# Patient Record
Sex: Female | Born: 1985
Health system: Southern US, Community
[De-identification: ages and names within clinical notes are randomized; demographics above are authoritative.]

## PROBLEM LIST (undated history)

## (undated) DIAGNOSIS — T8859XA Other complications of anesthesia, initial encounter: Secondary | ICD-10-CM

## (undated) DIAGNOSIS — F329 Major depressive disorder, single episode, unspecified: Secondary | ICD-10-CM

## (undated) DIAGNOSIS — F32A Depression, unspecified: Secondary | ICD-10-CM

## (undated) DIAGNOSIS — F419 Anxiety disorder, unspecified: Secondary | ICD-10-CM

## (undated) DIAGNOSIS — M1990 Unspecified osteoarthritis, unspecified site: Secondary | ICD-10-CM

## (undated) DIAGNOSIS — J45909 Unspecified asthma, uncomplicated: Secondary | ICD-10-CM

## (undated) DIAGNOSIS — M797 Fibromyalgia: Secondary | ICD-10-CM

## (undated) DIAGNOSIS — K760 Fatty (change of) liver, not elsewhere classified: Secondary | ICD-10-CM

## (undated) DIAGNOSIS — T424X1A Poisoning by benzodiazepines, accidental (unintentional), initial encounter: Secondary | ICD-10-CM

## (undated) DIAGNOSIS — K589 Irritable bowel syndrome without diarrhea: Secondary | ICD-10-CM

## (undated) DIAGNOSIS — T4145XA Adverse effect of unspecified anesthetic, initial encounter: Secondary | ICD-10-CM

## (undated) DIAGNOSIS — F410 Panic disorder [episodic paroxysmal anxiety] without agoraphobia: Secondary | ICD-10-CM

## (undated) DIAGNOSIS — E669 Obesity, unspecified: Secondary | ICD-10-CM

## (undated) DIAGNOSIS — I10 Essential (primary) hypertension: Secondary | ICD-10-CM

## (undated) DIAGNOSIS — R7303 Prediabetes: Secondary | ICD-10-CM

## (undated) HISTORY — PX: MOUTH SURGERY: SHX715

## (undated) HISTORY — DX: Poisoning by benzodiazepines, accidental (unintentional), initial encounter: T42.4X1A

## (undated) HISTORY — PX: OTHER SURGICAL HISTORY: SHX169

## (undated) HISTORY — DX: Obesity, unspecified: E66.9

## (undated) HISTORY — DX: Fatty (change of) liver, not elsewhere classified: K76.0

---
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
## 2005-01-10 ENCOUNTER — Other Ambulatory Visit: Admission: RE | Admit: 2005-01-10 | Discharge: 2005-01-10 | Payer: Self-pay | Admitting: Obstetrics and Gynecology

## 2005-03-22 ENCOUNTER — Emergency Department (HOSPITAL_COMMUNITY): Admission: EM | Admit: 2005-03-22 | Discharge: 2005-03-22 | Payer: Self-pay | Admitting: Emergency Medicine

## 2006-01-01 ENCOUNTER — Encounter: Admission: RE | Admit: 2006-01-01 | Discharge: 2006-01-01 | Payer: Self-pay | Admitting: Obstetrics and Gynecology

## 2006-02-05 ENCOUNTER — Inpatient Hospital Stay (HOSPITAL_COMMUNITY): Admission: RE | Admit: 2006-02-05 | Discharge: 2006-02-08 | Payer: Self-pay | Admitting: Obstetrics and Gynecology

## 2006-02-09 ENCOUNTER — Encounter: Admission: RE | Admit: 2006-02-09 | Discharge: 2006-02-23 | Payer: Self-pay | Admitting: Obstetrics and Gynecology

## 2010-09-19 ENCOUNTER — Emergency Department (HOSPITAL_COMMUNITY)
Admission: EM | Admit: 2010-09-19 | Discharge: 2010-09-19 | Disposition: A | Discharge status: Home / Self Care | Payer: PRIVATE HEALTH INSURANCE | Attending: Emergency Medicine | Admitting: Emergency Medicine

## 2010-09-19 DIAGNOSIS — Z0389 Encounter for observation for other suspected diseases and conditions ruled out: Secondary | ICD-10-CM | POA: Insufficient documentation

## 2010-11-18 ENCOUNTER — Ambulatory Visit
Admission: RE | Admit: 2010-11-18 | Discharge: 2010-11-18 | Disposition: A | Discharge status: Home / Self Care | Payer: PRIVATE HEALTH INSURANCE | Source: Ambulatory Visit | Attending: Family Medicine | Admitting: Family Medicine

## 2010-11-18 ENCOUNTER — Other Ambulatory Visit: Payer: Self-pay | Admitting: Family Medicine

## 2011-03-01 ENCOUNTER — Ambulatory Visit: Payer: PRIVATE HEALTH INSURANCE

## 2011-03-01 ENCOUNTER — Ambulatory Visit (INDEPENDENT_AMBULATORY_CARE_PROVIDER_SITE_OTHER): Payer: PRIVATE HEALTH INSURANCE | Admitting: Family Medicine

## 2011-03-01 DIAGNOSIS — F339 Major depressive disorder, recurrent, unspecified: Secondary | ICD-10-CM | POA: Insufficient documentation

## 2011-03-01 DIAGNOSIS — F329 Major depressive disorder, single episode, unspecified: Secondary | ICD-10-CM | POA: Insufficient documentation

## 2011-03-01 DIAGNOSIS — R1110 Vomiting, unspecified: Secondary | ICD-10-CM

## 2011-03-01 DIAGNOSIS — F419 Anxiety disorder, unspecified: Secondary | ICD-10-CM

## 2011-03-01 DIAGNOSIS — F909 Attention-deficit hyperactivity disorder, unspecified type: Secondary | ICD-10-CM

## 2011-03-01 DIAGNOSIS — F32A Depression, unspecified: Secondary | ICD-10-CM

## 2011-03-01 DIAGNOSIS — R079 Chest pain, unspecified: Secondary | ICD-10-CM

## 2011-03-01 DIAGNOSIS — I10 Essential (primary) hypertension: Secondary | ICD-10-CM

## 2011-03-01 HISTORY — DX: Attention-deficit hyperactivity disorder, unspecified type: F90.9

## 2011-03-01 HISTORY — DX: Major depressive disorder, recurrent, unspecified: F33.9

## 2011-03-01 LAB — POCT URINE PREGNANCY: Preg Test, Ur: NEGATIVE

## 2011-03-01 NOTE — Progress Notes (Signed)
Patient Name: Katie [name] Date of Birth: [birth year] Medical Record Number: [id number] Gender: female Date of Encounter: 03/01/2011  History of Present Illness:  Katie [name] is a 26 y.o. very pleasant female patient who presents with the following:  Noted left sided chest discomfort last night, hurt to take a deep breath.  Went upstairs, laid down and went to sleep.  Today has noted the pain on and off.  No pain currenly.  Pain started after she finished dinner last night.  Not associated with exertion.  No nausea with pain.  Pain feels sharp and may last 15 to 20 minutes.  Has "only happened twice today."  Never had this before.    Does have a family history of heart disease in her grandparents.  She has a history of obesity, hypertension.  No tobacco LMP = "I don't know, I don't keep track."   Also has noted sporadic vomiting over the last month.  Will feel nauseated, throw up and then feel ok. Did have heartburn once during this month.  Also had some cough sporadically.  Occasionally coughs something up.  No fevers or chills, does have some nasal congestion.  No diarrhea, appetite ok.    Patient Active Problem List  Diagnoses  . ADHD (attention deficit hyperactivity disorder)  . Anxiety  . Hypertension  . Depression   Past Medical History  Diagnosis Date  . Obesity    Past Surgical History  Procedure Date  . Mouth surgery    History  Substance Use Topics  . Smoking status: Never Smoker   . Smokeless tobacco: Never Used  . Alcohol Use: Yes     once in a while   Family History  Problem Relation Age of Onset  . Heart disease Mother   . Heart disease Maternal Grandfather    Allergies  Allergen Reactions  . Flexeril (Cyclobenzaprine Hcl)    Medication list has been reviewed and updated.  Review of Systems: As per HPI, otherwise negative  Physical Examination: Filed Vitals:   03/01/11 1702  BP: 120/76  Pulse: 72  Temp: 98.4 F (36.9 C)  TempSrc:  Oral  Resp: 16  Height: 5' 6.5" (1.689 m)  Weight: 254 lb 3.2 oz (115.304 kg)   Body mass index is 40.41 kg/(m^2).  GEN: WDWN, NAD, Non-toxic, A & O x 3, obese, acne typical of PCOS.  HEENT: Atraumatic, Normocephalic. Neck supple. No masses, No LAD. Ears and Nose: No external deformity. CV: RRR, No M/G/R. No JVD. No thrill. No extra heart sounds. PULM: CTA B, no wheezes, crackles, rhonchi. No retractions. No resp. distress. No accessory muscle use. ABD: S, NT, ND, +BS. No rebound. No HSM. EXTR: No c/c/e NEURO Normal gait.  PSYCH: Normally interactive. Conversant. Not depressed or anxious appearing.  Calm demeanor.  Chest wall:  Patient does indicate some tenderness to the left of her sternum to pressure.  There is no rash or swelling.   Results for orders placed in visit on 03/01/11  POCT URINE PREGNANCY      Component Value Range   Preg Test, Ur Negative     [facility] reading (PRIMARY) by  Dr. Patsy Lager.  Negative chest xray  CHEST - 2 VIEW  Comparison: None.  Findings: Midline trachea. Normal heart size and mediastinal contours. No pleural effusion or pneumothorax. Clear lungs.  IMPRESSION: Normal chest.  Original Report Authenticated By: Consuello Bossier, M.D.  EKG:  Normal.  12 lead with some artifact that suggests pvc but none on  2 page rhythm strip.  Compared with EKG 2012 and no change.  No ST elevation or depression.   Assessment and Plan: 1. ADHD (attention deficit hyperactivity disorder)    2. Anxiety    3. Hypertension    4. Depression    5. Chest pain  EKG 12-Lead, DG Chest 2 View  6. Vomiting  POCT urine pregnancy   26 year old female with no cardiac history, here due to concern about chest pain that occurred last night at rest and that has occurred twice today.  No pain currently.  Her CXR, VS and EKG are all reassuring.  Explained that I cannot completely rule- out a cardiac issue without an ER evaluation and chest pain rule- out, but the patient declined to  pursue this at this point.  She will let us know if her symptoms continue.  If her CP gets worse, lasts longer or is associated with exercise I urged her to seek care right away.  I believe her CP is likely MSK in origin as it is somewhat reproducible and she has had a cough lately.  Also consider the possibility of GERD- suggested that she try a 2 week trial of an OTC acid reducer.  Ms. Weinfeld does suffer from anxiety and depression which may be contributing to her symptoms.    Vomiting occasionally: see discussion of GERD above.  If an OTC acid reducer does not resolve this problem she is to call or come back in- will need referral to GI for further evaluation. Sooner if worse.   HTN currently controlled with lisinopril. ADHD on adderall

## 2011-04-01 ENCOUNTER — Other Ambulatory Visit: Payer: Self-pay | Admitting: Family Medicine

## 2011-09-13 ENCOUNTER — Other Ambulatory Visit: Payer: Self-pay | Admitting: Physician Assistant

## 2011-11-19 DIAGNOSIS — F909 Attention-deficit hyperactivity disorder, unspecified type: Secondary | ICD-10-CM

## 2011-11-19 DIAGNOSIS — Z8249 Family history of ischemic heart disease and other diseases of the circulatory system: Secondary | ICD-10-CM | POA: Insufficient documentation

## 2011-11-19 DIAGNOSIS — IMO0001 Reserved for inherently not codable concepts without codable children: Secondary | ICD-10-CM | POA: Insufficient documentation

## 2011-11-19 HISTORY — DX: Family history of ischemic heart disease and other diseases of the circulatory system: Z82.49

## 2011-11-19 HISTORY — DX: Reserved for inherently not codable concepts without codable children: IMO0001

## 2011-11-19 HISTORY — DX: Attention-deficit hyperactivity disorder, unspecified type: F90.9

## 2012-03-10 ENCOUNTER — Other Ambulatory Visit: Payer: Self-pay | Admitting: Neurology

## 2012-03-10 DIAGNOSIS — R51 Headache: Secondary | ICD-10-CM

## 2012-03-10 DIAGNOSIS — R259 Unspecified abnormal involuntary movements: Secondary | ICD-10-CM

## 2012-03-10 DIAGNOSIS — Z9189 Other specified personal risk factors, not elsewhere classified: Secondary | ICD-10-CM

## 2012-03-18 ENCOUNTER — Other Ambulatory Visit: Payer: PRIVATE HEALTH INSURANCE

## 2012-03-19 ENCOUNTER — Other Ambulatory Visit: Payer: PRIVATE HEALTH INSURANCE

## 2012-04-05 ENCOUNTER — Ambulatory Visit: Payer: PRIVATE HEALTH INSURANCE | Admitting: Neurology

## 2013-12-21 DIAGNOSIS — K580 Irritable bowel syndrome with diarrhea: Secondary | ICD-10-CM | POA: Insufficient documentation

## 2013-12-21 HISTORY — DX: Irritable bowel syndrome with diarrhea: K58.0

## 2014-01-19 DIAGNOSIS — M436 Torticollis: Secondary | ICD-10-CM

## 2014-01-19 DIAGNOSIS — F411 Generalized anxiety disorder: Secondary | ICD-10-CM | POA: Insufficient documentation

## 2014-01-19 DIAGNOSIS — J9801 Acute bronchospasm: Secondary | ICD-10-CM | POA: Insufficient documentation

## 2014-01-19 HISTORY — DX: Generalized anxiety disorder: F41.1

## 2014-01-19 HISTORY — DX: Acute bronchospasm: J98.01

## 2014-01-19 HISTORY — DX: Torticollis: M43.6

## 2015-03-14 ENCOUNTER — Encounter: Payer: Self-pay | Admitting: Adult Health

## 2015-03-21 ENCOUNTER — Encounter: Payer: Self-pay | Admitting: Adult Health

## 2016-11-06 ENCOUNTER — Ambulatory Visit: Payer: PRIVATE HEALTH INSURANCE | Admitting: Neurology

## 2017-02-22 ENCOUNTER — Encounter: Payer: Self-pay | Admitting: Adult Health

## 2017-05-14 ENCOUNTER — Encounter: Payer: Self-pay | Admitting: Adult Health

## 2017-07-14 ENCOUNTER — Encounter: Payer: Self-pay | Admitting: Adult Health

## 2017-10-26 ENCOUNTER — Ambulatory Visit: Payer: BLUE CROSS/BLUE SHIELD | Admitting: Family Medicine

## 2017-10-26 ENCOUNTER — Encounter: Payer: Self-pay | Admitting: Family Medicine

## 2017-10-26 VITALS — BP 140/86 | HR 94 | Temp 98.2°F | Ht 67.0 in | Wt 290.0 lb

## 2017-10-26 DIAGNOSIS — M436 Torticollis: Secondary | ICD-10-CM

## 2017-10-26 DIAGNOSIS — F339 Major depressive disorder, recurrent, unspecified: Secondary | ICD-10-CM | POA: Diagnosis not present

## 2017-10-26 DIAGNOSIS — F419 Anxiety disorder, unspecified: Secondary | ICD-10-CM | POA: Diagnosis not present

## 2017-10-26 DIAGNOSIS — N921 Excessive and frequent menstruation with irregular cycle: Secondary | ICD-10-CM | POA: Diagnosis not present

## 2017-10-26 MED ORDER — DULOXETINE HCL 30 MG PO CPEP: 90.0000 mg | ORAL_CAPSULE | Freq: Every day | ORAL | 5 refills | Status: DC

## 2017-10-26 MED ORDER — OMEPRAZOLE 20 MG PO CPDR: 20.0000 mg | DELAYED_RELEASE_CAPSULE | Freq: Two times a day (BID) | ORAL | 2 refills | Status: DC

## 2017-10-26 MED ORDER — DIAZEPAM 5 MG PO TABS: 5.0000 mg | ORAL_TABLET | Freq: Four times a day (QID) | ORAL | 4 refills | Status: DC | PRN

## 2017-10-26 MED ORDER — NAPROXEN 500 MG PO TBEC: 500.0000 mg | DELAYED_RELEASE_TABLET | Freq: Two times a day (BID) | ORAL | 2 refills | Status: DC

## 2017-10-26 MED ORDER — BACLOFEN 20 MG PO TABS: 20.0000 mg | ORAL_TABLET | Freq: Three times a day (TID) | ORAL | 2 refills | Status: DC | PRN

## 2017-10-26 MED ORDER — NAPROXEN-ESOMEPRAZOLE 500-20 MG PO TBEC: 1.0000 | DELAYED_RELEASE_TABLET | Freq: Two times a day (BID) | ORAL | 2 refills | Status: DC
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
## 2017-10-26 NOTE — Patient Instructions (Signed)
I will call you when I get and review your records. Call me if you haven't heard from me in 1 months time.   Trial of omeprazole + naproxen instead of vimovo for cost benefit.

## 2017-11-02 ENCOUNTER — Telehealth: Payer: Self-pay | Admitting: Family Medicine

## 2017-11-02 NOTE — Telephone Encounter (Signed)
She returned the record release with notes on the back, but didn't sign it so we could track imaging/records unfortunately. Just had a couple clarification thoughts here:  1. Looks like with previous PCP she declined PT, sports med referral, and ortho referral? Can she tell me more about this? Did she have negative experience? Could we refer her for evaluation now?   2. Only bloodwork I can see was before 2016. If that is case we need to reorder. Let me know.   3. I can't see any recent (last few years) neck imaging which is why we wanted record release. If she has not had any done in last few years let me know.   4. If having the dizziness, vomiting, weakness, anxiety sx then we need to bring her back to re-discuss. I would recommend we get updated bloodwork first however. So let me know.

## 2017-11-03 NOTE — Telephone Encounter (Signed)
Left message for patient to call back. CRM created 

## 2017-11-06 NOTE — Telephone Encounter (Signed)
Left message for patient to call back  

## 2017-11-11 NOTE — Telephone Encounter (Signed)
I have attempted multiple times to reach the patient, unable to reach.

## 2017-11-11 NOTE — Telephone Encounter (Signed)
Please send a letter to have her call us. Thanks

## 2017-11-12 ENCOUNTER — Encounter: Payer: Self-pay | Admitting: Family Medicine

## 2017-11-12 NOTE — Telephone Encounter (Signed)
Letter has been mailed.

## 2017-11-16 ENCOUNTER — Telehealth: Payer: Self-pay | Admitting: Family Medicine

## 2017-11-16 ENCOUNTER — Encounter: Payer: Self-pay | Admitting: Family Medicine

## 2017-11-16 NOTE — Telephone Encounter (Signed)
Left message for patient to call back. CRM created 

## 2017-11-16 NOTE — Telephone Encounter (Signed)
I did get records from [facility]:  *Looks like she is due for a physical (can be scheduled any time)  *vitamin D was quite low; if she is not taking supplement she should be (2000 units daily) and would recommend recheck of levels.   *Was there any relation between her starting cymbalta and the neck pain? Could be a rare reaction so just want to make sure that she doesn't note a connection.  *Did she ever get seen with [facility] specialists for neck? She was referred  **I would recommend referral to [facility] ortho for evaluation and treatment at this point. It will be a good starting place for spinal assessment and initiating treatment of very spastic muscles.   I am happy to see her back to discuss her other written concerns with anxiety documented on record release (of note, we cannot do physical and anxiety assessment in same visit).

## 2017-11-17 NOTE — Telephone Encounter (Signed)
Spoke with patient she is aware that she is due for physical.  Understood that she should start a Vitamin D supplement 2000 a day since she is not currently taking Vitamin D  Stated there was no relation between Cymbalta and neck pain.  Stated that She never heard from [facility] specialist. Patient stated "it's a drive and my mom said there's a long wait." Would like to see a specialist closer to home. I looked into the referral to see why she has not been contacted and I do not see that a referral has been placed for this.  Agreed to referral for ortho.  Patient stated that the notes written on the back of her release form where her anxiety symptoms. Patient stated that "it's not as bad now since starting valium."  Patient is aware that the visit for a physical and anxiety would have to be sperate visits.

## 2017-11-18 ENCOUNTER — Other Ambulatory Visit: Payer: Self-pay | Admitting: Family Medicine

## 2017-11-18 DIAGNOSIS — M436 Torticollis: Secondary | ICD-10-CM

## 2017-11-18 NOTE — Telephone Encounter (Signed)
Noted patient is aware 

## 2017-11-18 NOTE — Telephone Encounter (Signed)
Thanks for that thorough response!  [facility] ortho ref placed for her. She should hear in 2 weeks.

## 2018-01-19 ENCOUNTER — Ambulatory Visit: Payer: Self-pay | Admitting: Adult Health

## 2018-02-16 ENCOUNTER — Ambulatory Visit (INDEPENDENT_AMBULATORY_CARE_PROVIDER_SITE_OTHER): Payer: BLUE CROSS/BLUE SHIELD | Admitting: Internal Medicine

## 2018-02-16 ENCOUNTER — Encounter: Payer: Self-pay | Admitting: Family Medicine

## 2018-02-16 VITALS — BP 100/70 | HR 100 | Temp 98.6°F | Ht 67.0 in | Wt 288.4 lb

## 2018-02-16 DIAGNOSIS — J069 Acute upper respiratory infection, unspecified: Secondary | ICD-10-CM | POA: Diagnosis not present

## 2018-02-16 NOTE — Progress Notes (Signed)
Established Patient Office Visit     CC/Reason for Visit: cough and cold symptoms x1 wk  HPI: Philecia Wagnon is a 33 y.o. female who is coming in today for the above mentioned reasons. Today she c/o a cough and rib pain from coughing x1 week.  She sts she has been coughing up green mucous and has been taking mucinex OTC. She c/o a slight sore throat and a runny nose, with blood tinged when she blow her nose. She denies fever and says shes been eating and drinking with no problems.  She denies ear pain or itching.     Past Medical/Surgical History: Past Medical History:  Diagnosis Date  . Hepatic steatosis   . Obesity     Past Surgical History:  Procedure Laterality Date  . MOUTH SURGERY      Social History:  reports that she has never smoked. She has never used smokeless tobacco. She reports current alcohol use. She reports that she does not use drugs.  Allergies: Allergies  Allergen Reactions  . Lorazepam     Other reaction(s): Other Neck spasm  . Flexeril [Cyclobenzaprine Hcl]     Family History:  Family History  Problem Relation Age of Onset  . Heart disease Mother   . Depression Mother   . High blood pressure Mother   . Kidney disease Mother   . Psychosis Mother   . Hearing loss Father   . Depression Father   . Anxiety disorder Father   . Asthma Daughter   . Learning disabilities Daughter   . Heart disease Maternal Grandfather   . Other Brother        fatty liver  . Multiple sclerosis Maternal Grandmother   . COPD Paternal Grandmother   . Heart failure Paternal Grandmother   . Heart attack Paternal Grandmother   . Stroke Paternal Grandmother   . Lung cancer Paternal Grandfather      Current Outpatient Medications:  .  baclofen (LIORESAL) 20 MG tablet, Take 1 tablet (20 mg total) by mouth 3 (three) times daily as needed for muscle spasms., Disp: 90 each, Rfl: 2 .  diazepam (VALIUM) 5 MG tablet, Take 1 tablet (5 mg total) by mouth  every 6 (six) hours as needed for anxiety. FROM DR Evelene Croon, Disp: 30 tablet, Rfl: 4 .  DULoxetine (CYMBALTA) 30 MG capsule, Take 3 capsules (90 mg total) by mouth daily., Disp: 90 capsule, Rfl: 5 .  Eluxadoline (VIBERZI) 100 MG TABS, Take by mouth 2 (two) times daily., Disp: , Rfl:  .  hydrOXYzine (VISTARIL) 25 MG capsule, Take 25 mg by mouth 4 (four) times daily., Disp: , Rfl:  .  lisinopril-hydrochlorothiazide (PRINZIDE,ZESTORETIC) 20-12.5 MG tablet, , Disp: , Rfl: 1 .  naproxen (EC NAPROSYN) 500 MG EC tablet, Take 1 tablet (500 mg total) by mouth 2 (two) times daily with a meal., Disp: 60 tablet, Rfl: 2 .  Naproxen-Esomeprazole (VIMOVO) 500-20 MG TBEC, Take 1 tablet by mouth 2 (two) times daily., Disp: 60 tablet, Rfl: 2 .  omeprazole (PRILOSEC) 20 MG capsule, Take 1 capsule (20 mg total) by mouth 2 (two) times daily before a meal., Disp: 60 capsule, Rfl: 2 .  phentermine 37.5 MG capsule, Take 37.5 mg by mouth daily., Disp: , Rfl:   Review of Systems: Constitutional: Denies fever, chills, diaphoresis, appetite change and fatigue.  HEENT: Denies photophobia, eye pain, redness, hearing loss, ear pain.  She does c/o of congestion, sore throat, rhinorrhea, sneezing x1 week.  Denies fever, denies diff. Eating and drinking   Respiratory: Denies SOB, DOE,  chest tightness,  and wheezing.  C/o coughing x1 week Cardiovascular: Denies chest pain, palpitations and leg swelling.    Physical Exam: Vitals:   02/16/18 1036  BP: 100/70  Pulse: 100  Temp: 98.6 F (37 C)  TempSrc: Oral  SpO2: 97%  Weight: 288 lb 6 oz (130.8 kg)  Height: 5\' 7"  (1.702 m)    Body mass index is 45.17 kg/m.  Constitutional: NAD, calm Eyes: PERRL, lids and conjunctivae normal ENMT: Mucous membranes are moist. Posterior pharynx clear of any exudate or lesions. Tympanic membrane with slight erythema, no bulging.  pharyx has erythema but no exudate Respiratory: clear to auscultation bilaterally, no wheezing, no crackles.  Normal respiratory effort. No accessory muscle use.  Cardiovascular: Regular rate and rhythm, no murmurs / rubs / gallops. No extremity edema. 2+ pedal pulses.    Impression and Plan:  Viral upper respiratory tract infection -cont to increase water intake -take delsym otc for coughing -take tylenol sinus OTC for pain or pressure -advised to RTC if fever or worsening over next 10-14 days.   Patient Instructions  Great seeing you today & hope you feel better soon.  Instructions: -take Delsym OTC for coughing -increase water intake to flush out sinuses -take tylenol sinus OTC for pain or pressure -follow up in 10 days if symptoms dont improve or if you feel worse     Murlean Iba, RN DNP student [facility]

## 2018-02-16 NOTE — Patient Instructions (Addendum)
Great seeing you today & hope you feel better soon.  Instructions: -take Delsym OTC for coughing -increase water intake to flush out sinuses -take tylenol sinus OTC for pain or pressure -follow up in 10 days if symptoms dont improve or if you feel worse

## 2018-02-19 ENCOUNTER — Telehealth: Payer: Self-pay | Admitting: *Deleted

## 2018-02-19 NOTE — Telephone Encounter (Signed)
Spoke with patient and explained that if she wasn't feeling better she should have called or scheduled a follow up appointment with Dr Ardyth Harps.  Patient states " the doctor denied me medical care and medication.  Now I have missed work and need a doctor's note."  Patient requests to speak with a "supervisor".

## 2018-02-19 NOTE — Telephone Encounter (Signed)
I saw her 3 days ago. If she was sick enough to miss work today, she should have scheduled an appointment to be seen. If her symptoms are worsening she needs to be seen by a provider. Based on our interaction on 2/4 she should have been improving.

## 2018-02-19 NOTE — Telephone Encounter (Signed)
Copied from CRM [id number]. Topic: General - Inquiry >> Feb 19, 2018  2:30 PM Crist InfanteHarrald, Kathy J wrote: Reason for CRM: pt states she could not go to work today do to extreme coughing. Pt needs a work note for being out today. Pt saw Dr Ardyth HarpsHernandez on 02/16/18.  Pt asap

## 2018-02-19 NOTE — Telephone Encounter (Signed)
Pt was here to pick up a work note for being sick and would like to have it today if possible.  She is aware that the provider is in with patients and will address it as soon as she can.  She has an attitude b/c the letter wasn't ready when she came in.

## 2018-02-24 ENCOUNTER — Encounter: Payer: Self-pay | Admitting: *Deleted

## 2018-06-24 ENCOUNTER — Other Ambulatory Visit: Payer: Self-pay | Admitting: Family Medicine

## 2018-08-24 ENCOUNTER — Other Ambulatory Visit: Payer: Self-pay | Admitting: Family Medicine

## 2018-09-26 ENCOUNTER — Other Ambulatory Visit: Payer: Self-pay | Admitting: Family Medicine

## 2018-11-02 ENCOUNTER — Other Ambulatory Visit: Payer: Self-pay | Admitting: Family Medicine

## 2019-01-24 LAB — HM PAP SMEAR

## 2019-02-07 ENCOUNTER — Other Ambulatory Visit: Payer: Self-pay | Admitting: Family Medicine

## 2019-02-09 ENCOUNTER — Other Ambulatory Visit: Payer: Self-pay

## 2019-02-09 ENCOUNTER — Ambulatory Visit (INDEPENDENT_AMBULATORY_CARE_PROVIDER_SITE_OTHER): Payer: BC Managed Care – PPO | Admitting: Family Medicine

## 2019-02-09 ENCOUNTER — Encounter: Payer: Self-pay | Admitting: Family Medicine

## 2019-02-09 VITALS — BP 102/70 | HR 101 | Temp 97.4°F | Ht 67.0 in | Wt 274.9 lb

## 2019-02-09 DIAGNOSIS — E118 Type 2 diabetes mellitus with unspecified complications: Secondary | ICD-10-CM

## 2019-02-09 DIAGNOSIS — M436 Torticollis: Secondary | ICD-10-CM | POA: Diagnosis not present

## 2019-02-09 DIAGNOSIS — M5489 Other dorsalgia: Secondary | ICD-10-CM

## 2019-02-09 MED ORDER — PHENTERMINE HCL 37.5 MG PO CAPS: 37.5000 mg | ORAL_CAPSULE | Freq: Every day | ORAL | 0 refills | Status: DC

## 2019-02-09 MED ORDER — BLOOD GLUCOSE MONITOR KIT: PACK | 0 refills | Status: DC

## 2019-02-09 NOTE — Progress Notes (Signed)
Katie [name] DOB: 1985-04-01 Encounter date: 02/09/2019  This is a 34 y.o. female who presents with Chief Complaint  Patient presents with  . Follow-up    patient requests visit to discuss new diagnosis of diabetes after visit with OB/GYN  . states [facility] diagnosed her with hyperplasia  . Back Pain    worse since last year    History of present illness: Last blood work I have is from March/2017 Last visit with me was in October/2019 for establish care.  At that time, we discussed torticollis of the neck as well as stress and anxiety and depression.  (After record review, we reached out to the patient:  I did get records from [place]:  *Looks like she is due for a physical (can be scheduled any time)  *vitamin D was quite low; if she is not taking supplement she should be (2000 units daily) and would recommend recheck of levels.   *Was there any relation between her starting cymbalta and the neck pain? Could be a rare reaction so just want to make sure that she doesn't note a connection.  *Did she ever get seen with [facility] for neck? She was referred  **I would recommend referral to [facility] for evaluation and treatment at this point. It will be a good starting place for spinal assessment and initiating treatment of very spastic muscles.   I am happy to see her back to discuss her other written concerns with anxiety documented on record release (of note, we cannot do physical and anxiety assessment in same visit).  Response: Spoke with patient she is aware that she is due for physical.  Understood that she should start a Vitamin D supplement 2000 a day since she is not currently taking Vitamin D  Stated there was no relation between Cymbalta and neck pain.  Stated that She never heard from [facility]. Patient stated "it's a drive and my mom said there's a long wait." Would like to see a specialist closer to home. I  looked into the referral to see why she has not been contacted and I do not see that a referral has been placed for this.  Agreed to referral for ortho. (sent to [facility])  Patient stated that the notes written on the back of her release form where her anxiety symptoms. Patient stated that "it's not as bad now since starting valium."  Patient is aware that the visit for a physical and anxiety would have to be sperate visits.)  Unfortunately, patient did not follow-up with [facility].  Referral was placed and they attempted to contact the patient, but were unable to get a hold of her and thus schedule a visit.  Patient states today "I did not return sooner because last time he was here had a bad experience".  She was upset she was not given an antibiotic when she came in for a sick visit.  She repeatedly states "I just been a doctor who cares about me does something to help me"   Follows with Dr. Toy Care q 6 months for anxiety/depression, fibromyalgia.   We also discussed menstrual irregularity. She has since followed with gynecologist. She has been told she has hyperplasia and is worried about this.  Was told that she had diabetes by gyn 2 weeks ago. Had gestational diabetes but just monitored blood sugar/watched intake at that time. Has lost 6lbs since last week. Sticking with protein, vegetables for diet. Got exercise bike for apartment.  States that she didn't have great experience last time when she was here for sick visit in Feb. Had to talk with office manager to get work note.   Neck locks up and pops frequently. ROM is very limited. Has had more problems with lower back. Left shoulder higher than right shoulder. Started sleeping flat on back which she doesn't like but has a hard time getting comfortable - was getting stabbing sensation in back. Just can't get good nights sleep.  Pain in left arm - if she bends arms gets numbness in 4th and 5th digits.  Feels weak in  hands, can't lean to left - just hurts in mid back.   Has been taking the lisinopril-hctz daily. Has been changing diet so thinks this might have helped blood sugar as well.   She would like to continue to work on weight loss.  She has been on weight loss medication in the past and feels that this has been helpful for her.  She has problems with portion control and overeating.  Previously she was eating a lot of unhealthy food, but has been working on this.  Allergies  Allergen Reactions  . Lorazepam     Other reaction(s): Other Neck spasm  . Flexeril [Cyclobenzaprine Hcl]    Current Meds  Medication Sig  . diazepam (VALIUM) 5 MG tablet Take 1 tablet (5 mg total) by mouth every 6 (six) hours as needed for anxiety. FROM DR Toy Care  . Drospirenone (SLYND PO) Take by mouth.  . DULoxetine (CYMBALTA) 30 MG capsule Take 3 capsules (90 mg total) by mouth daily.  Marland Kitchen EC-NAPROXEN 500 MG EC tablet TAKE ONE TABLET BY MOUTH TWICE A DAY AS NEEDED FOR PAIN  . Eluxadoline (VIBERZI) 100 MG TABS Take by mouth 2 (two) times daily.  . hydrOXYzine (VISTARIL) 25 MG capsule Take 25 mg by mouth 4 (four) times daily.  Marland Kitchen lisinopril-hydrochlorothiazide (PRINZIDE,ZESTORETIC) 20-12.5 MG tablet   . omeprazole (PRILOSEC) 20 MG capsule Take 1 capsule (20 mg total) by mouth 2 (two) times daily before a meal.  . phentermine 37.5 MG capsule Take 1 capsule (37.5 mg total) by mouth daily.  . [DISCONTINUED] baclofen (LIORESAL) 20 MG tablet Take 1 tablet (20 mg total) by mouth 3 (three) times daily as needed for muscle spasms.  . [DISCONTINUED] Naproxen-Esomeprazole (VIMOVO) 500-20 MG TBEC Take 1 tablet by mouth 2 (two) times daily.  . [DISCONTINUED] phentermine 37.5 MG capsule Take 37.5 mg by mouth daily.    Review of Systems  Constitutional: Negative for chills, fatigue and fever.  Respiratory: Negative for cough, chest tightness, shortness of breath and wheezing.   Cardiovascular: Negative for chest pain, palpitations and  leg swelling.  Endocrine: Negative for polydipsia, polyphagia and polyuria.  Musculoskeletal: Positive for neck pain and neck stiffness.  Neurological: Positive for weakness and numbness.    Objective:  BP 102/70 (BP Location: Left Arm, Patient Position: Sitting, Cuff Size: Large)   Pulse (!) 101   Temp (!) 97.4 F (36.3 C) (Temporal)   Ht '5\' 7"'  (1.702 m)   Wt 274 lb 14.4 oz (124.7 kg)   SpO2 98%   BMI 43.06 kg/m   Weight: 274 lb 14.4 oz (124.7 kg)   BP Readings from Last 3 Encounters:  02/09/19 102/70  02/16/18 100/70  10/26/17 140/86   Wt Readings from Last 3 Encounters:  02/09/19 274 lb 14.4 oz (124.7 kg)  02/16/18 288 lb 6 oz (130.8 kg)  10/26/17 290 lb (131.5 kg)  Physical Exam Constitutional:      General: She is not in acute distress.    Appearance: She is well-developed.  Cardiovascular:     Rate and Rhythm: Normal rate and regular rhythm.     Heart sounds: Normal heart sounds. No murmur. No friction rub.  Pulmonary:     Effort: Pulmonary effort is normal. No respiratory distress.     Breath sounds: Normal breath sounds. No wheezing or rales.  Musculoskeletal:     Right lower leg: No edema.     Left lower leg: No edema.  Neurological:     Mental Status: She is alert and oriented to person, place, and time.     Deep Tendon Reflexes:     Reflex Scores:      Tricep reflexes are 2+ on the right side and 2+ on the left side.      Bicep reflexes are 2+ on the right side and 2+ on the left side.      Brachioradialis reflexes are 2+ on the right side and 2+ on the left side.    Comments: There is spasm left paracervical musculature, left trap.  Patient is limited with all range of motion of the neck, more significant with left turn or tilt.  No appreciated weakness noted in upper extremities bilaterally.  She has pain that radiates up the left arm tricep reflex testing.  Psychiatric:        Attention and Perception: Attention normal.        Mood and Affect:  Mood is depressed. Affect is blunt.        Speech: Speech normal.        Behavior: Behavior normal.     Assessment/Plan  1. Torticollis, acquired I strongly encouraged her to follow-up on the referral for orthopedics.  I have also given her the number that she can call if she has not heard from them within 2 weeks time.  I discussed with her that her current neck problem is not something that can be easily or quickly fixed with medication and is going to take likely multiple types of intervention to get some improvement.  We reviewed that by going to a multispecialty orthopedic group, she will be able to get multispecialty attention like spine specialty, physical therapy.  I reemphasized that this is something that is beyond my scope of being able to correct. - Ambulatory referral to Orthopedics  2. Midline back pain, unspecified back location, unspecified chronicity - Ambulatory referral to Orthopedics  3. Morbid obesity ([place]) She is interested in weight loss.  I am okay with a trial of phentermine given low blood pressure today.  She has already started to lose weight with dietary changes and plans to start exercise as well.  We will see her back in 1 month in the office to do a weight recheck and discuss further. - phentermine 37.5 MG capsule; Take 1 capsule (37.5 mg total) by mouth daily.  Dispense: 30 capsule; Refill: 0  4. Controlled type 2 diabetes mellitus with complication, without long-term current use of insulin ([place]) Diabetic information was given in the office today.  We will need to continue to discuss in more detail and go through dietary plans, but she has already started to cut out carbohydrates in her diet and focus on adding exercise, which will be helpful.  We discussed at this point, I do not feel that she needs to start medication, but we can rediscuss at next visit and also see what  follow-up A1c looks like in 3 months time.  She is interested in meeting with a dietitian,  as this referral has been put in as well. - blood glucose meter kit and supplies KIT; Dispense based on patient and insurance preference. Use up to four times daily as directed. (FOR ICD-9 250.00, 250.01).  Dispense: 1 each; Refill: 0 - Amb ref to [facility]   Return in about 1 month (around 03/12/2019) for 30 minutes please.    Micheline Rough, MD

## 2019-02-09 NOTE — Patient Instructions (Addendum)
Orthopedic surgeon in [place], [place] Address: [address], [place], [place] [address] Phone: [phone]  Call in 3 weeks if you haven't heard from them.     Check blood sugars occasionally in morning and some 2-3 hours after eating. Bring these recorded sugars with you to next visit.

## 2019-02-18 ENCOUNTER — Other Ambulatory Visit: Payer: Self-pay | Admitting: Obstetrics & Gynecology

## 2019-02-21 ENCOUNTER — Other Ambulatory Visit (HOSPITAL_COMMUNITY): Payer: BC Managed Care – PPO

## 2019-02-22 ENCOUNTER — Other Ambulatory Visit: Payer: Self-pay

## 2019-02-22 ENCOUNTER — Inpatient Hospital Stay (HOSPITAL_COMMUNITY): Admission: RE | Admit: 2019-02-22 | Payer: BC Managed Care – PPO | Source: Ambulatory Visit

## 2019-02-22 ENCOUNTER — Encounter (HOSPITAL_BASED_OUTPATIENT_CLINIC_OR_DEPARTMENT_OTHER): Payer: Self-pay | Admitting: Obstetrics & Gynecology

## 2019-02-22 ENCOUNTER — Encounter (HOSPITAL_COMMUNITY)
Admission: RE | Admit: 2019-02-22 | Discharge: 2019-02-22 | Disposition: A | Discharge status: Home / Self Care | Payer: BC Managed Care – PPO | Source: Ambulatory Visit | Attending: Obstetrics & Gynecology | Admitting: Obstetrics & Gynecology

## 2019-02-22 DIAGNOSIS — R9431 Abnormal electrocardiogram [ECG] [EKG]: Secondary | ICD-10-CM | POA: Insufficient documentation

## 2019-02-22 DIAGNOSIS — Z01818 Encounter for other preprocedural examination: Secondary | ICD-10-CM | POA: Insufficient documentation

## 2019-02-22 LAB — CBC
HCT: 36.2 % (ref 36.0–46.0)
Hemoglobin: 11.8 g/dL — ABNORMAL LOW (ref 12.0–15.0)
MCH: 29.6 pg (ref 26.0–34.0)
MCHC: 32.6 g/dL (ref 30.0–36.0)
MCV: 91 fL (ref 80.0–100.0)
Platelets: 195 10*3/uL (ref 150–400)
RBC: 3.98 MIL/uL (ref 3.87–5.11)
RDW: 12.7 % (ref 11.5–15.5)
WBC: 7.9 10*3/uL (ref 4.0–10.5)
nRBC: 0 % (ref 0.0–0.2)

## 2019-02-22 LAB — BASIC METABOLIC PANEL
Anion gap: 9 (ref 5–15)
BUN: 22 mg/dL — ABNORMAL HIGH (ref 6–20)
CO2: 23 mmol/L (ref 22–32)
Calcium: 9.1 mg/dL (ref 8.9–10.3)
Chloride: 105 mmol/L (ref 98–111)
Creatinine, Ser: 0.92 mg/dL (ref 0.44–1.00)
GFR calc Af Amer: >60 mL/min (ref >60)
GFR calc non Af Amer: >60 mL/min (ref >60)
Glucose, Bld: 95 mg/dL (ref 70–99)
Potassium: 4 mmol/L (ref 3.5–5.1)
Sodium: 137 mmol/L (ref 135–145)

## 2019-02-22 NOTE — Progress Notes (Signed)
Spoke w/ via phone for pre-op interview---Kamariah  Lab needs dos----urine preg             Lab results------lab appt 02-22-2019 for cbc, bmet,, ekg  COVID test ------02-23-2019 Arrive at -------700 am 02-25-2019 NPO after ------midnight Medications to take morning of surgery -----duloxetine, slynd, omeprazole Diabetic medication -----n/a Patient Special Instructions ----- Pre-Op special Istructions ----- Patient verbalized understanding of instructions that were given at this phone interview. Patient denies shortness of breath, chest pain, fever, cough a this phone interview.

## 2019-02-23 ENCOUNTER — Other Ambulatory Visit (HOSPITAL_COMMUNITY)
Admission: RE | Admit: 2019-02-23 | Discharge: 2019-02-23 | Disposition: A | Discharge status: Home / Self Care | Payer: BC Managed Care – PPO | Source: Ambulatory Visit | Attending: Obstetrics & Gynecology | Admitting: Obstetrics & Gynecology

## 2019-02-23 DIAGNOSIS — Z20822 Contact with and (suspected) exposure to covid-19: Secondary | ICD-10-CM | POA: Diagnosis not present

## 2019-02-23 DIAGNOSIS — Z01812 Encounter for preprocedural laboratory examination: Secondary | ICD-10-CM | POA: Diagnosis present

## 2019-02-23 LAB — SARS CORONAVIRUS 2 (TAT 6-24 HRS): SARS Coronavirus 2: NEGATIVE

## 2019-02-24 ENCOUNTER — Encounter (HOSPITAL_BASED_OUTPATIENT_CLINIC_OR_DEPARTMENT_OTHER)
Admission: RE | Disposition: A | Discharge status: Home / Self Care | Payer: Self-pay | Source: Home / Self Care | Attending: Obstetrics & Gynecology

## 2019-02-24 ENCOUNTER — Ambulatory Visit (HOSPITAL_BASED_OUTPATIENT_CLINIC_OR_DEPARTMENT_OTHER)
Admission: RE | Admit: 2019-02-24 | Discharge: 2019-02-24 | Disposition: A | Discharge status: Home / Self Care | Payer: BC Managed Care – PPO | Attending: Obstetrics & Gynecology | Admitting: Obstetrics & Gynecology

## 2019-02-24 ENCOUNTER — Ambulatory Visit (HOSPITAL_BASED_OUTPATIENT_CLINIC_OR_DEPARTMENT_OTHER): Payer: BC Managed Care – PPO | Admitting: Anesthesiology

## 2019-02-24 ENCOUNTER — Encounter (HOSPITAL_BASED_OUTPATIENT_CLINIC_OR_DEPARTMENT_OTHER): Payer: Self-pay | Admitting: Obstetrics & Gynecology

## 2019-02-24 DIAGNOSIS — K219 Gastro-esophageal reflux disease without esophagitis: Secondary | ICD-10-CM | POA: Insufficient documentation

## 2019-02-24 DIAGNOSIS — Z6841 Body Mass Index (BMI) 40.0 and over, adult: Secondary | ICD-10-CM | POA: Insufficient documentation

## 2019-02-24 DIAGNOSIS — E282 Polycystic ovarian syndrome: Secondary | ICD-10-CM | POA: Insufficient documentation

## 2019-02-24 DIAGNOSIS — Z79899 Other long term (current) drug therapy: Secondary | ICD-10-CM | POA: Insufficient documentation

## 2019-02-24 DIAGNOSIS — N8502 Endometrial intraepithelial neoplasia [EIN]: Secondary | ICD-10-CM | POA: Insufficient documentation

## 2019-02-24 DIAGNOSIS — F419 Anxiety disorder, unspecified: Secondary | ICD-10-CM | POA: Diagnosis not present

## 2019-02-24 DIAGNOSIS — F329 Major depressive disorder, single episode, unspecified: Secondary | ICD-10-CM | POA: Insufficient documentation

## 2019-02-24 DIAGNOSIS — I10 Essential (primary) hypertension: Secondary | ICD-10-CM | POA: Diagnosis not present

## 2019-02-24 HISTORY — DX: Other complications of anesthesia, initial encounter: T88.59XA

## 2019-02-24 HISTORY — DX: Endometrial intraepithelial neoplasia (EIN): N85.02

## 2019-02-24 HISTORY — DX: Essential (primary) hypertension: I10

## 2019-02-24 HISTORY — DX: Prediabetes: R73.03

## 2019-02-24 HISTORY — PX: DILATATION & CURETTAGE/HYSTEROSCOPY WITH MYOSURE: SHX6511

## 2019-02-24 LAB — POCT PREGNANCY, URINE: Preg Test, Ur: NEGATIVE

## 2019-02-24 SURGERY — DILATATION & CURETTAGE/HYSTEROSCOPY WITH MYOSURE
Anesthesia: General | Site: Vagina

## 2019-02-24 MED ORDER — MEGESTROL ACETATE 40 MG PO TABS: 40.0000 mg | ORAL_TABLET | Freq: Two times a day (BID) | ORAL | 3 refills | Status: DC

## 2019-02-24 MED ORDER — MIDAZOLAM HCL 2 MG/2ML IJ SOLN
INTRAMUSCULAR | Status: AC
  Filled 2019-02-24: qty 2

## 2019-02-24 MED ORDER — DEXAMETHASONE SODIUM PHOSPHATE 10 MG/ML IJ SOLN
INTRAMUSCULAR | Status: AC
  Filled 2019-02-24: qty 1

## 2019-02-24 MED ORDER — OXYCODONE HCL 5 MG PO TABS
ORAL_TABLET | ORAL | Status: AC
  Filled 2019-02-24: qty 1

## 2019-02-24 MED ORDER — ACETAMINOPHEN 500 MG PO TABS
ORAL_TABLET | ORAL | Status: AC
  Filled 2019-02-24: qty 2

## 2019-02-24 MED ORDER — FENTANYL CITRATE (PF) 100 MCG/2ML IJ SOLN
INTRAMUSCULAR | Status: DC | PRN
  Administered 2019-02-24 (×2): 50 ug via INTRAVENOUS

## 2019-02-24 MED ORDER — FENTANYL CITRATE (PF) 100 MCG/2ML IJ SOLN
INTRAMUSCULAR | Status: AC
  Filled 2019-02-24: qty 4

## 2019-02-24 MED ORDER — LIDOCAINE 2% (20 MG/ML) 5 ML SYRINGE
INTRAMUSCULAR | Status: AC
  Filled 2019-02-24: qty 5

## 2019-02-24 MED ORDER — PROPOFOL 10 MG/ML IV BOLUS
INTRAVENOUS | Status: DC | PRN
  Administered 2019-02-24: 200 mg via INTRAVENOUS

## 2019-02-24 MED ORDER — SCOPOLAMINE 1 MG/3DAYS TD PT72
MEDICATED_PATCH | TRANSDERMAL | Status: AC
  Filled 2019-02-24: qty 1

## 2019-02-24 MED ORDER — SODIUM CHLORIDE 0.9 % IR SOLN
Status: DC | PRN
  Administered 2019-02-24: 3000 mL

## 2019-02-24 MED ORDER — ONDANSETRON HCL 4 MG/2ML IJ SOLN
INTRAMUSCULAR | Status: AC
  Filled 2019-02-24: qty 2

## 2019-02-24 MED ORDER — FENTANYL CITRATE (PF) 100 MCG/2ML IJ SOLN
25.0000 ug | INTRAMUSCULAR | Status: DC | PRN
  Administered 2019-02-24 (×4): 25 ug via INTRAVENOUS
  Filled 2019-02-24: qty 1

## 2019-02-24 MED ORDER — MIDAZOLAM HCL 2 MG/2ML IJ SOLN
INTRAMUSCULAR | Status: DC | PRN
  Administered 2019-02-24: 2 mg via INTRAVENOUS

## 2019-02-24 MED ORDER — KETOROLAC TROMETHAMINE 30 MG/ML IJ SOLN
INTRAMUSCULAR | Status: DC | PRN
  Administered 2019-02-24: 30 mg via INTRAVENOUS

## 2019-02-24 MED ORDER — SCOPOLAMINE 1 MG/3DAYS TD PT72
MEDICATED_PATCH | TRANSDERMAL | Status: DC | PRN
  Administered 2019-02-24: 1 via TRANSDERMAL

## 2019-02-24 MED ORDER — LIDOCAINE 2% (20 MG/ML) 5 ML SYRINGE
INTRAMUSCULAR | Status: DC | PRN
  Administered 2019-02-24: 80 mg via INTRAVENOUS

## 2019-02-24 MED ORDER — FENTANYL CITRATE (PF) 100 MCG/2ML IJ SOLN
INTRAMUSCULAR | Status: AC
  Filled 2019-02-24: qty 2

## 2019-02-24 MED ORDER — ONDANSETRON HCL 4 MG/2ML IJ SOLN
INTRAMUSCULAR | Status: DC | PRN
  Administered 2019-02-24: 4 mg via INTRAVENOUS

## 2019-02-24 MED ORDER — CEFAZOLIN SODIUM-DEXTROSE 2-4 GM/100ML-% IV SOLN
INTRAVENOUS | Status: AC
  Filled 2019-02-24: qty 100

## 2019-02-24 MED ORDER — ACETAMINOPHEN 500 MG PO TABS
1000.0000 mg | ORAL_TABLET | Freq: Once | ORAL | Status: AC
  Administered 2019-02-24: 1000 mg via ORAL
  Filled 2019-02-24: qty 2

## 2019-02-24 MED ORDER — LACTATED RINGERS IV SOLN
INTRAVENOUS | Status: DC
  Filled 2019-02-24: qty 1000

## 2019-02-24 MED ORDER — OXYCODONE HCL 5 MG PO TABS
5.0000 mg | ORAL_TABLET | Freq: Once | ORAL | Status: AC
  Administered 2019-02-24: 11:00:00 5 mg via ORAL
  Filled 2019-02-24: qty 1

## 2019-02-24 MED ORDER — KETOROLAC TROMETHAMINE 30 MG/ML IJ SOLN
INTRAMUSCULAR | Status: AC
  Filled 2019-02-24: qty 1

## 2019-02-24 MED ORDER — PROPOFOL 10 MG/ML IV BOLUS
INTRAVENOUS | Status: AC
  Filled 2019-02-24: qty 40

## 2019-02-24 MED ORDER — DEXAMETHASONE SODIUM PHOSPHATE 10 MG/ML IJ SOLN
INTRAMUSCULAR | Status: DC | PRN
  Administered 2019-02-24: 5 mg via INTRAVENOUS

## 2019-02-24 MED ORDER — LIDOCAINE-EPINEPHRINE 1 %-1:100000 IJ SOLN
INTRAMUSCULAR | Status: DC | PRN
  Administered 2019-02-24: 20 mL

## 2019-02-24 MED ORDER — CEFAZOLIN SODIUM-DEXTROSE 2-4 GM/100ML-% IV SOLN
2.0000 g | INTRAVENOUS | Status: AC
  Administered 2019-02-24: 2 g via INTRAVENOUS
  Filled 2019-02-24: qty 100

## 2019-02-24 SURGICAL SUPPLY — 19 items
CANISTER SUCT 3000ML PPV (MISCELLANEOUS) ×2 IMPLANT
CATH ROBINSON RED A/P 16FR (CATHETERS) ×2 IMPLANT
COVER WAND RF STERILE (DRAPES) ×2 IMPLANT
DEVICE MYOSURE LITE (MISCELLANEOUS) IMPLANT
DEVICE MYOSURE REACH (MISCELLANEOUS) ×1 IMPLANT
GAUZE 4X4 16PLY RFD (DISPOSABLE) ×2 IMPLANT
GLOVE BIO SURGEON STRL SZ 6.5 (GLOVE) ×1 IMPLANT
GLOVE BIO SURGEON STRL SZ7 (GLOVE) ×2 IMPLANT
GLOVE BIOGEL PI IND STRL 7.0 (GLOVE) ×2 IMPLANT
GLOVE BIOGEL PI IND STRL 7.5 (GLOVE) IMPLANT
GLOVE BIOGEL PI INDICATOR 7.0 (GLOVE) ×3
GLOVE BIOGEL PI INDICATOR 7.5 (GLOVE) ×1
GOWN STRL REUS W/TWL LRG LVL3 (GOWN DISPOSABLE) ×4 IMPLANT
KIT PROCEDURE FLUENT (KITS) ×2 IMPLANT
PACK VAGINAL MINOR WOMEN LF (CUSTOM PROCEDURE TRAY) ×2 IMPLANT
PAD OB MATERNITY 4.3X12.25 (PERSONAL CARE ITEMS) ×2 IMPLANT
SEAL CERVICAL OMNI LOK (ABLATOR) IMPLANT
SEAL ROD LENS SCOPE MYOSURE (ABLATOR) ×2 IMPLANT
TOWEL OR 17X26 10 PK STRL BLUE (TOWEL DISPOSABLE) ×2 IMPLANT

## 2019-02-24 NOTE — Discharge Instructions (Signed)
Next dose Aleve,Motrin,Advil after 3 pm as needed Next dose tylenol after 2 pm as needed  Post Anesthesia Home Care Instructions  Activity: Get plenty of rest for the remainder of the day. A responsible individual must stay with you for 24 hours following the procedure.  For the next 24 hours, DO NOT: -Drive a car -Advertising copywriter -Drink alcoholic beverages -Take any medication unless instructed by your physician -Make any legal decisions or sign important papers.  Meals: Start with liquid foods such as gelatin or soup. Progress to regular foods as tolerated. Avoid greasy, spicy, heavy foods. If nausea and/or vomiting occur, drink only clear liquids until the nausea and/or vomiting subsides. Call your physician if vomiting continues.  Special Instructions/Symptoms: Your throat may feel dry or sore from the anesthesia or the breathing tube placed in your throat during surgery. If this causes discomfort, gargle with warm salt water. The discomfort should disappear within 24 hours.  If you had a scopolamine patch placed behind your ear for the management of post- operative nausea and/or vomiting:  1. The medication in the patch is effective for 72 hours, after which it should be removed.  Wrap patch in a tissue and discard in the trash. Wash hands thoroughly with soap and water. 2. You may remove the patch earlier than 72 hours if you experience unpleasant side effects which may include dry mouth, dizziness or visual disturbances. 3. Avoid touching the patch. Wash your hands with soap and water after contact with the patch.

## 2019-02-24 NOTE — Anesthesia Procedure Notes (Signed)
Procedure Name: LMA Insertion Date/Time: 02/24/2019 9:09 AM Performed by: Tyrone Nine, CRNA Pre-anesthesia Checklist: Timeout performed, Patient identified, Emergency Drugs available, Suction available and Patient being monitored Patient Re-evaluated:Patient Re-evaluated prior to induction Oxygen Delivery Method: Circle system utilized Preoxygenation: Pre-oxygenation with 100% oxygen Induction Type: IV induction Ventilation: Mask ventilation without difficulty LMA: LMA inserted LMA Size: 4.0 Number of attempts: 1 Placement Confirmation: breath sounds checked- equal and bilateral,  CO2 detector and positive ETCO2 Tube secured with: Tape Dental Injury: Teeth and Oropharynx as per pre-operative assessment

## 2019-02-24 NOTE — Anesthesia Preprocedure Evaluation (Addendum)
Anesthesia Evaluation  Patient identified by MRN, date of birth, ID band Patient awake    Reviewed: Allergy & Precautions, NPO status , Patient's Chart, lab work & pertinent test results  Airway Mallampati: III  TM Distance: <3 FB Neck ROM: Full  Mouth opening: Limited Mouth Opening  Dental no notable dental hx. (+) Teeth Intact, Dental Advisory Given   Pulmonary neg pulmonary ROS,    Pulmonary exam normal breath sounds clear to auscultation       Cardiovascular hypertension, Pt. on medications Normal cardiovascular exam Rhythm:Regular Rate:Normal     Neuro/Psych PSYCHIATRIC DISORDERS Anxiety Depression negative neurological ROS     GI/Hepatic Neg liver ROS, GERD  Medicated,  Endo/Other  diabetes, Well ControlledMorbid obesity (BMI 43)  Renal/GU negative Renal ROS  negative genitourinary   Musculoskeletal negative musculoskeletal ROS (+)   Abdominal   Peds  Hematology negative hematology ROS (+)   Anesthesia Other Findings   Reproductive/Obstetrics                           Anesthesia Physical Anesthesia Plan  ASA: III  Anesthesia Plan: General   Post-op Pain Management:    Induction: Intravenous  PONV Risk Score and Plan: Ondansetron, Dexamethasone and Midazolam  Airway Management Planned: LMA  Additional Equipment:   Intra-op Plan:   Post-operative Plan: Extubation in OR  Informed Consent: I have reviewed the patients History and Physical, chart, labs and discussed the procedure including the risks, benefits and alternatives for the proposed anesthesia with the patient or authorized representative who has indicated his/her understanding and acceptance.     Dental advisory given  Plan Discussed with: CRNA  Anesthesia Plan Comments:         Anesthesia Quick Evaluation

## 2019-02-24 NOTE — Transfer of Care (Signed)
Immediate Anesthesia Transfer of Care Note  Patient: Katie [name]  Procedure(s) Performed: DILATATION & CURETTAGE/HYSTEROSCOPY WITH MYOSURE (N/A Vagina )  Patient Location: PACU  Anesthesia Type:General  Level of Consciousness: awake, alert , oriented and patient cooperative  Airway & Oxygen Therapy: Patient Spontanous Breathing and Patient connected to nasal cannula oxygen  Post-op Assessment: Report given to RN and Post -op Vital signs reviewed and stable  Post vital signs: Reviewed and stable  Last Vitals:  Vitals Value Taken Time  BP    Temp    Pulse 109 02/24/19 0941  Resp 16 02/24/19 0941  SpO2 100 % 02/24/19 0941  Vitals shown include unvalidated device data.  Last Pain:  Vitals:   02/24/19 0726  TempSrc: Oral  PainSc: 0-No pain      Patients Stated Pain Goal: 4 (02/24/19 0726)  Complications: No apparent anesthesia complications

## 2019-02-24 NOTE — Op Note (Signed)
Preoperative diagnosis: Complex atypical endometrial hyperplasia. Chronic anovulation.   Postop diagnosis: as above.  Procedure: Hysteroscopy guided visual D&C with Myosure Reach Anesthesia General via LMA and paracervical block with 20cc 1% Lidocaine with epi. Surgeon: Shea Evans, MD  Assistant:None IV fluids 800 cc LR Estimated blood loss  5 cc Urine output: straight catheter preop  50 cc Saline irrigation fluid deficit: 300 cc  Complications none  Condition stable  Disposition PACU  Specimen: Endometrial curettings   Procedure  Indication: 33 yo with long standing oligomenorrhea/menorrhagia, thick endometrium stripe on pelvic sono and office endometrial biopsy noted Complex atypical endometrial hyperplasia. Patient declines hysterectomy as she desires future child-bearing, so declined GynOnc referral. She is brought here for visual D&C with Myosure hysteroscopy for thorough endometrial sampling to rule out co-exsiting endometrial cancer. Patient was counseled on risks/ complications including infection, bleeding, damage to internal organs, she understood and agrees, gave informed written consent for the procedure.  Patient was brought to the operating room with IV running. Time out was carried out.  She underwent general anesthesia via LMA without complications. She was given dorsolithotomy position. Parts were prepped and draped in standard fashion. Bladder emptied with red rubber cath.. Bimanual exam revealed uterus to be anteverted and normal size. Speculum was placed and cervix was grasped with single-tooth tenaculum. Cervical block with 20 cc 1% Xylocaine with epinephrine given.  Cervical os was noted to be dilated likely from ongoing bleeding. Diagnostic Hysteroscope was introduced in the uterine cavity under vision, using saline irrigation. Thick irregular endometrial line noted. So decision was made to proceed with Myosure D&C using Reach.  Visual D&C done of the entire endometrium  until both tubal ostii were seen well. Specimen sent to path. Hysteroscope was removed. Endometrial curettage was performed with sharp curette with no return of tissue at this point.   Fluid deficit 300 cc.  All counts are correct x2. No complications. Patient was made supine dorsal anesthesia and brought to the recovery room in stable condition.  Patient will be discharged home. Follow up in 2 weeks in office. Warning signs of infection and excessive bleeding reviewed.   V.Mckay Brandt, MD.

## 2019-02-24 NOTE — Anesthesia Postprocedure Evaluation (Signed)
Anesthesia Post Note  Patient: Suszanne Conners  Procedure(s) Performed: DILATATION & CURETTAGE/HYSTEROSCOPY WITH MYOSURE (N/A Vagina )     Patient location during evaluation: PACU Anesthesia Type: General Level of consciousness: awake and alert Pain management: pain level controlled Vital Signs Assessment: post-procedure vital signs reviewed and stable Respiratory status: spontaneous breathing, nonlabored ventilation, respiratory function stable and patient connected to nasal cannula oxygen Cardiovascular status: blood pressure returned to baseline and stable Postop Assessment: no apparent nausea or vomiting Anesthetic complications: no    Last Vitals:  Vitals:   02/24/19 1030 02/24/19 1200  BP: 127/63 125/72  Pulse: 88 88  Resp: 12 14  Temp:  36.8 C  SpO2: 98% 99%    Last Pain:  Vitals:   02/24/19 1200  TempSrc:   PainSc: 3                  Dragon Thrush L Zorana Brockwell
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
## 2019-02-25 LAB — SURGICAL PATHOLOGY

## 2019-03-09 ENCOUNTER — Encounter: Payer: Self-pay | Admitting: Registered"

## 2019-03-09 ENCOUNTER — Encounter: Payer: BC Managed Care – PPO | Attending: Family Medicine | Admitting: Registered"

## 2019-03-09 ENCOUNTER — Other Ambulatory Visit: Payer: Self-pay

## 2019-03-09 DIAGNOSIS — E119 Type 2 diabetes mellitus without complications: Secondary | ICD-10-CM | POA: Insufficient documentation

## 2019-03-09 HISTORY — DX: Type 2 diabetes mellitus without complications: E11.9

## 2019-03-09 NOTE — Patient Instructions (Addendum)
Aim to eat balanced meals and snacks. The casserole that you described should be fine if you divide into 4 servings. You can you checking your blood sugar to help you see how you are doing. Fasting and 2 hours after eating give you the most useful information. Checking 1-2 x per week is often enough. Consider keeping a blood sugar log and take to your doctor's appointments. Try some of the suggestions to add more variety to your salads. Great progress on changes you have already made such as increased exercise and cutting out sugar sweetened beverages.

## 2019-03-09 NOTE — Progress Notes (Signed)
Diabetes Self-Management Education  Visit Type: First/Initial  Appt. Start Time: 1540 Appt. End Time: 1610  03/09/2019  Ms. Katie [name], identified by name and date of birth, is a 34 y.o. female with a diagnosis of Diabetes: Type 2.   ASSESSMENT  Last menstrual period 02/17/2019. There is no height or weight on file to calculate BMI.   Patient states making several changes since getting diagnosis such as purchasing a stationary bike and using 60 min daily and cutting out sweetened beverages. 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  Patient states other dietary changes include salads for lunch and cutting out pasta and candy. Pt reports 12 lb weight loss since last MD visit.  Patient reports recent surgery and is having some stress related to concern over her condition and not understanding medications she is on.  RD discussed metformin and potential benefit if she sees that her A1c starts to increase.   Diabetes Self-Management Education - 03/09/19 1548      Visit Information   Visit Type  First/Initial      Initial Visit   Diabetes Type  Type 2    Are you currently following a meal plan?  Yes    What type of meal plan do you follow?  low carb    Are you taking your medications as prescribed?  Not on Medications    Date Diagnosed  Jan 2021      Health Coping   How would you rate your overall health?  Good      Psychosocial Assessment   Patient Belief/Attitude about Diabetes  Motivated to manage diabetes    How often do you need to have someone help you when you read instructions, pamphlets, or other written materials from your doctor or pharmacy?  1 - Never    What is the last grade level you completed in school?  some college      Complications   Last HgB A1C per patient/outside source  6.5 %    How often do you check your blood sugar?  0 times/day (not testing)    Have you had a dilated eye exam in the past 12 months?  No    Have you  had a dental exam in the past 12 months?  No    Are you checking your feet?  No      Dietary Intake   Breakfast  jimmy dean bites 12 g carb    Snack (morning)  string cheese    Lunch  salad    Dinner  meat, vegetables.    Beverage(s)  water, with a little flavor      Exercise   Exercise Type  Light (walking / raking leaves)    How many days per week to you exercise?  7    How many minutes per day do you exercise?  60    Total minutes per week of exercise  420      Patient Education   Previous Diabetes Education  Yes (please comment)   GDM 2007   Disease state   Definition of diabetes, type 1 and 2, and the diagnosis of diabetes    Nutrition management   Role of diet in the treatment of diabetes and the relationship between the three main macronutrients and blood glucose level;Food label reading, portion sizes and measuring food.    Physical activity and exercise   Role of exercise on diabetes management, blood pressure control and  cardiac health.    Medications  Reviewed patients medication for diabetes, action, purpose, timing of dose and side effects.    Monitoring  Identified appropriate SMBG and/or A1C goals.      Individualized Goals (developed by patient)   Nutrition  General guidelines for healthy choices and portions discussed    Physical Activity  Exercise 5-7 days per week    Monitoring   test my blood glucose as discussed      Outcomes   Expected Outcomes  Demonstrated interest in learning. Expect positive outcomes    Future DMSE  4-6 wks    Program Status  Not Completed       Individualized Plan for Diabetes Self-Management Training:   Learning Objective:  Patient will have a greater understanding of diabetes self-management. Patient education plan is to attend individual and/or group sessions per assessed needs and concerns.   Patient Instructions  Aim to eat balanced meals and snacks. The casserole that you described should be fine if you divide into 4  servings. You can you checking your blood sugar to help you see how you are doing. Fasting and 2 hours after eating give you the most useful information. Checking 1-2 x per week is often enough. Consider keeping a blood sugar log and take to your doctor's appointments. Try some of the suggestions to add more variety to your salads. Great progress on changes you have already made such as increased exercise and cutting out sugar sweetened beverages.   Expected Outcomes:  Demonstrated interest in learning. Expect positive outcomes  Education material provided: ADA - How to Thrive: A Guide for Your Journey with Diabetes, A1C conversion sheet and Carbohydrate counting sheet, Mix it Up Salad ideas  If problems or questions, patient to contact team via:  Phone and Mychart  Future DSME appointment: 4-6 wks

## 2019-03-24 ENCOUNTER — Other Ambulatory Visit: Payer: Self-pay

## 2019-03-25 ENCOUNTER — Encounter: Payer: Self-pay | Admitting: Family Medicine

## 2019-03-25 ENCOUNTER — Other Ambulatory Visit: Payer: Self-pay | Admitting: Family Medicine

## 2019-03-25 ENCOUNTER — Ambulatory Visit (INDEPENDENT_AMBULATORY_CARE_PROVIDER_SITE_OTHER): Payer: BC Managed Care – PPO | Admitting: Family Medicine

## 2019-03-25 VITALS — BP 118/80 | HR 94 | Temp 97.7°F | Ht 66.0 in | Wt 252.6 lb

## 2019-03-25 DIAGNOSIS — N8502 Endometrial intraepithelial neoplasia [EIN]: Secondary | ICD-10-CM

## 2019-03-25 DIAGNOSIS — I10 Essential (primary) hypertension: Secondary | ICD-10-CM | POA: Diagnosis not present

## 2019-03-25 DIAGNOSIS — M436 Torticollis: Secondary | ICD-10-CM

## 2019-03-25 DIAGNOSIS — E119 Type 2 diabetes mellitus without complications: Secondary | ICD-10-CM | POA: Diagnosis not present

## 2019-03-25 NOTE — Patient Instructions (Signed)
I will be back in touch with you once I review Dr. Camillia Herter office notes/recommendations.

## 2019-03-25 NOTE — Progress Notes (Signed)
Katie [name] DOB: 12-01-1985 Encounter date: 03/25/2019  This is a 34 y.o. female who presents with Chief Complaint  Patient presents with  . Follow-up    History of present illness: Wasn't able to get MRI last night due to spasm. Neck couldn't stop moving. She has to lay on side to prop up head in order to lay flat. Took 2 valium and a baclofen before MRI and still couldn't lay still.   "I'm eating like a bird". Trying to keep eating simple - eating jimmy dean frozen breakfast; light snacks, light frozen dinner and then light dinner. Megace is new from Dr. Benjie Karvonen - increases her appetite.   Torticollis neck: Patient has followed up with Ortho.  See above.  She is very frustrated by not being able to complete MRI. She feels that because she could not get this completed that she should just give up entirely and that no one is going to be able to help her.  In the past she was told that this condition was "all in her head" and that she needed to see psychiatry.  HTN: not checking at home. Still taking lisinopril-hctz.   DMII: did check sugar yesterday and today: yesterday was 100 in morning; after lunch was 86. Today just checked 103. Really low carb diet.   Anxiety/Depression: just frustrated with everything going on. Also incident at work with colleague that made her uncomfortable, but she doesn't want to report/discuss with colleague or management.    Allergies  Allergen Reactions  . Lorazepam     Other reaction(s): Other Neck spasm  . Flexeril [Cyclobenzaprine Hcl]     Difficulty breathing   Current Meds  Medication Sig  . baclofen (LIORESAL) 10 MG tablet Take 10 mg by mouth 4 (four) times daily.  . blood glucose meter kit and supplies KIT Dispense based on patient and insurance preference. Use up to four times daily as directed. (FOR ICD-9 250.00, 250.01).  . diazepam (VALIUM) 5 MG tablet Take 1 tablet (5 mg total) by mouth every 6 (six) hours as needed for anxiety.  FROM DR [facility]  . Drospirenone (SLYND PO) Take by mouth.  . DULoxetine (CYMBALTA) 30 MG capsule Take 3 capsules (90 mg total) by mouth daily.  Marland Kitchen EC-NAPROXEN 500 MG EC tablet TAKE ONE TABLET BY MOUTH TWICE A DAY AS NEEDED FOR PAIN  . Eluxadoline (VIBERZI) 100 MG TABS Take by mouth 2 (two) times daily. No refills  . hydrOXYzine (VISTARIL) 25 MG capsule Take 25 mg by mouth as needed.   Marland Kitchen lisinopril-hydrochlorothiazide (PRINZIDE,ZESTORETIC) 20-12.5 MG tablet   . megestrol (MEGACE) 40 MG tablet Take 1 tablet (40 mg total) by mouth 2 (two) times daily.  Marland Kitchen omeprazole (PRILOSEC) 20 MG capsule Take 1 capsule (20 mg total) by mouth 2 (two) times daily before a meal.  . phentermine 37.5 MG capsule Take 1 capsule (37.5 mg total) by mouth daily.  Marland Kitchen UNABLE TO FIND Med Name:megace daily not sure does [facility] does not have    Review of Systems  Constitutional: Negative for chills, fatigue and fever.  Respiratory: Negative for cough, chest tightness, shortness of breath and wheezing.   Cardiovascular: Negative for chest pain, palpitations and leg swelling.  Musculoskeletal:       No change in torticollis.  Still significant muscle spasm in the neck and limited range of motion.  Psychiatric/Behavioral: Positive for agitation.    Objective:  BP 118/80 (BP Location: Left Arm, Patient Position: Sitting, Cuff Size: Large)  Pulse 94   Temp 97.7 F (36.5 C) (Temporal)   Ht '5\' 6"'  (1.676 m)   Wt 252 lb 9.6 oz (114.6 kg)   SpO2 96%   BMI 40.77 kg/m   Weight: 252 lb 9.6 oz (114.6 kg)   BP Readings from Last 3 Encounters:  03/25/19 118/80  02/24/19 125/72  02/22/19 (!) 146/68   Wt Readings from Last 3 Encounters:  03/25/19 252 lb 9.6 oz (114.6 kg)  02/24/19 267 lb 1.6 oz (121.2 kg)  02/22/19 268 lb 9.6 oz (121.8 kg)    Physical Exam Constitutional:      General: She is not in acute distress.    Appearance: She is well-developed.  Cardiovascular:     Rate and Rhythm: Normal rate and regular  rhythm.     Heart sounds: Normal heart sounds. No murmur. No friction rub.  Pulmonary:     Effort: Pulmonary effort is normal. No respiratory distress.     Breath sounds: Normal breath sounds. No wheezing or rales.  Musculoskeletal:     Right lower leg: No edema.     Left lower leg: No edema.  Neurological:     Mental Status: She is alert and oriented to person, place, and time.  Psychiatric:        Attention and Perception: Attention normal.        Mood and Affect: Affect is angry.        Behavior: Behavior normal.        Cognition and Memory: Cognition normal.     Assessment/Plan  1. Essential hypertension Blood pressures well controlled today.  Continue current medication.  2. Newly diagnosed diabetes ([place]) She has worked very hard on decreased caloric intake.  Actually concerned she is not taking in enough calories at this point, she was given her diabetic handout and MA went through carb counting and carb servings with her today.  3. Complex atypical endometrial hyperplasia She is following with gynecology.  I requested recent office notes.  She is worried about and does not understand what follow-up is needed.  I told her to review office notes, pathology and try to get back with her to review what their thoughts/plans are.  4. Torticollis, acquired We reached out to Dr. Jeralyn Ruths office today to let them know about her being unable to tolerate the MRI.  I tried to encourage her to be patient with this process. They will work with her to try and allow muscles to relax so that they can complete evaluation and work on treatment plan.    Return in about 3 months (around 06/25/2019). Much of visit spent with reassuring today. She has not had good experiences with physicians in the past and is very distrustful in general when it comes to health care. I will try to follow along closely with her conditions and specialty care so that we can make sure she feels that she is understanding  all communication.    Micheline Rough, MD

## 2019-03-26 ENCOUNTER — Encounter: Payer: Self-pay | Admitting: Family Medicine

## 2019-03-28 MED ORDER — PHENTERMINE HCL 37.5 MG PO CAPS: 37.5000 mg | ORAL_CAPSULE | Freq: Every day | ORAL | 1 refills | Status: DC

## 2019-03-29 ENCOUNTER — Encounter: Payer: Self-pay | Admitting: Family Medicine

## 2019-03-31 ENCOUNTER — Encounter: Payer: Self-pay | Admitting: Family Medicine

## 2019-04-01 ENCOUNTER — Encounter: Payer: Self-pay | Admitting: Family Medicine

## 2019-04-02 ENCOUNTER — Other Ambulatory Visit: Payer: Self-pay | Admitting: Family Medicine

## 2019-04-02 ENCOUNTER — Encounter: Payer: Self-pay | Admitting: Family Medicine

## 2019-04-02 DIAGNOSIS — E118 Type 2 diabetes mellitus with unspecified complications: Secondary | ICD-10-CM

## 2019-04-03 ENCOUNTER — Encounter: Payer: Self-pay | Admitting: Family Medicine

## 2019-04-04 ENCOUNTER — Other Ambulatory Visit: Payer: Self-pay | Admitting: *Deleted

## 2019-04-04 ENCOUNTER — Encounter: Payer: Self-pay | Admitting: Family Medicine

## 2019-04-04 DIAGNOSIS — G8929 Other chronic pain: Secondary | ICD-10-CM

## 2019-04-04 DIAGNOSIS — E559 Vitamin D deficiency, unspecified: Secondary | ICD-10-CM

## 2019-04-04 DIAGNOSIS — M542 Cervicalgia: Secondary | ICD-10-CM

## 2019-04-04 DIAGNOSIS — E118 Type 2 diabetes mellitus with unspecified complications: Secondary | ICD-10-CM

## 2019-04-04 DIAGNOSIS — D649 Anemia, unspecified: Secondary | ICD-10-CM

## 2019-04-04 DIAGNOSIS — R0683 Snoring: Secondary | ICD-10-CM

## 2019-04-04 DIAGNOSIS — R5383 Other fatigue: Secondary | ICD-10-CM

## 2019-04-04 HISTORY — DX: Cervicalgia: M54.2

## 2019-04-04 HISTORY — DX: Other chronic pain: G89.29

## 2019-04-04 MED ORDER — BLOOD GLUCOSE MONITOR KIT: PACK | 0 refills | Status: DC

## 2019-04-04 NOTE — Addendum Note (Signed)
Addended by: Wynn Banker on: 04/04/2019 11:36 AM   Modules accepted: Orders

## 2019-04-04 NOTE — Telephone Encounter (Signed)
Rx done. 

## 2019-04-06 ENCOUNTER — Encounter: Payer: Self-pay | Admitting: Family Medicine

## 2019-04-08 ENCOUNTER — Encounter: Payer: Self-pay | Admitting: Family Medicine

## 2019-04-09 ENCOUNTER — Encounter: Payer: Self-pay | Admitting: Family Medicine

## 2019-04-13 ENCOUNTER — Encounter: Payer: Self-pay | Admitting: Family Medicine

## 2019-04-18 ENCOUNTER — Encounter: Payer: Self-pay | Admitting: Family Medicine

## 2019-04-18 ENCOUNTER — Telehealth: Payer: Self-pay | Admitting: Family Medicine

## 2019-04-18 NOTE — Telephone Encounter (Signed)
Please make sure they received rx's for glucometer, strips, lancets. Please reorder if they do not have.

## 2019-04-18 NOTE — Telephone Encounter (Signed)
Happiness from pharmacy needed clarification on prescription that was sent in for patient.

## 2019-04-19 ENCOUNTER — Telehealth: Payer: Self-pay | Admitting: Family Medicine

## 2019-04-19 ENCOUNTER — Encounter: Payer: Self-pay | Admitting: Family Medicine

## 2019-04-19 NOTE — Telephone Encounter (Signed)
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  I offered a MyChart appointment for tomorrow with Dr. Hassan Rowan at 1 PM and she said that she can't do that time because she is at work and can't use her phone. I offered a MyChart with Dr. Selena Batten today and she doesn't want to see another provider. She said that she will just deal with it.   Please advise

## 2019-04-19 NOTE — Telephone Encounter (Signed)
Spoke with Loma Sousa at the pharmacy and informed her the pt needs a glucometer and supplies.  Loma Sousa stated the pt has an Rx on file previously for Contour kit and supplies and I advised Loma Sousa this should be fine if preferred by the pt and if her insurance will cover this.  Loma Sousa stated they will fill the Rx for the pt.

## 2019-04-20 ENCOUNTER — Ambulatory Visit: Payer: BC Managed Care – PPO | Admitting: Registered"

## 2019-04-20 NOTE — Telephone Encounter (Signed)
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   I would suggest trying benadryl or topical cortisone otc first and if not working let me know. If irritation is everywhere then this is going to be more difficult to address so she will have to let me know and it would be helpful to have the visit to talk through/eval and come up with idease for working through this.

## 2019-04-20 NOTE — Telephone Encounter (Signed)
Spoke with the pt and informed her of the message below.   

## 2019-04-25 ENCOUNTER — Encounter: Payer: Self-pay | Admitting: Family Medicine

## 2019-04-26 ENCOUNTER — Encounter: Payer: Self-pay | Admitting: Family Medicine

## 2019-04-29 ENCOUNTER — Encounter: Payer: Self-pay | Admitting: Family Medicine

## 2019-04-29 DIAGNOSIS — R5383 Other fatigue: Secondary | ICD-10-CM

## 2019-04-29 DIAGNOSIS — D649 Anemia, unspecified: Secondary | ICD-10-CM

## 2019-04-29 DIAGNOSIS — R739 Hyperglycemia, unspecified: Secondary | ICD-10-CM

## 2019-04-29 NOTE — Telephone Encounter (Signed)
Pt is calling in wanting advise about what to do with the actions from the OB-GYN.  Pt would like to have a call back today to discuss this in detail pt is very upset. Pt is aware that provider is in with pts at this time.

## 2019-04-29 NOTE — Telephone Encounter (Signed)
Spoke with patient.   She felt like she was attacked at visit today with gynecologist. She had been asked to come in for visit by nurse because she had multiple questions through [place]. When she got to appointment office manager was in room. She felt like they were attacking her. She does have visit with another gyn later this month.

## 2019-04-30 NOTE — Telephone Encounter (Signed)
Please call patient to set up bloodwork appointment at her convenience.

## 2019-05-02 ENCOUNTER — Telehealth: Payer: Self-pay | Admitting: Family Medicine

## 2019-05-02 ENCOUNTER — Encounter: Payer: Self-pay | Admitting: Family Medicine

## 2019-05-02 NOTE — Telephone Encounter (Signed)
Pt wanted to let Dr. Hassan Rowan know that her OBGYN office will no longer see her. She went to give her an apology letter and the front office would not accept it. They informed her that she had been dismissed from the practice. She advised that she was giving up with treatment for OBGYN care she did not want to see a new one. She was very upset with how she was treated by that office.

## 2019-05-03 ENCOUNTER — Encounter: Payer: Self-pay | Admitting: Family Medicine

## 2019-05-03 ENCOUNTER — Emergency Department (HOSPITAL_COMMUNITY)
Admission: EM | Admit: 2019-05-03 | Discharge: 2019-05-04 | Disposition: A | Discharge status: Home / Self Care | Payer: BC Managed Care – PPO | Attending: Emergency Medicine | Admitting: Emergency Medicine

## 2019-05-03 ENCOUNTER — Other Ambulatory Visit: Payer: Self-pay

## 2019-05-03 ENCOUNTER — Other Ambulatory Visit: Payer: Self-pay | Admitting: Obstetrics and Gynecology

## 2019-05-03 DIAGNOSIS — I10 Essential (primary) hypertension: Secondary | ICD-10-CM | POA: Diagnosis not present

## 2019-05-03 DIAGNOSIS — Z79899 Other long term (current) drug therapy: Secondary | ICD-10-CM | POA: Insufficient documentation

## 2019-05-03 DIAGNOSIS — T424X2A Poisoning by benzodiazepines, intentional self-harm, initial encounter: Secondary | ICD-10-CM | POA: Insufficient documentation

## 2019-05-03 DIAGNOSIS — F909 Attention-deficit hyperactivity disorder, unspecified type: Secondary | ICD-10-CM | POA: Diagnosis not present

## 2019-05-03 DIAGNOSIS — F332 Major depressive disorder, recurrent severe without psychotic features: Secondary | ICD-10-CM | POA: Diagnosis not present

## 2019-05-03 DIAGNOSIS — F4329 Adjustment disorder with other symptoms: Secondary | ICD-10-CM | POA: Diagnosis present

## 2019-05-03 DIAGNOSIS — Z20822 Contact with and (suspected) exposure to covid-19: Secondary | ICD-10-CM | POA: Diagnosis not present

## 2019-05-03 LAB — CBC WITH DIFFERENTIAL/PLATELET
Abs Immature Granulocytes: 0.02 10*3/uL (ref 0.00–0.07)
Basophils Absolute: 0 10*3/uL (ref 0.0–0.1)
Basophils Relative: 0 %
Eosinophils Absolute: 0.1 10*3/uL (ref 0.0–0.5)
Eosinophils Relative: 1 %
HCT: 42.2 % (ref 36.0–46.0)
Hemoglobin: 13.5 g/dL (ref 12.0–15.0)
Immature Granulocytes: 0 %
Lymphocytes Relative: 19 %
Lymphs Abs: 1.4 10*3/uL (ref 0.7–4.0)
MCH: 30.2 pg (ref 26.0–34.0)
MCHC: 32 g/dL (ref 30.0–36.0)
MCV: 94.4 fL (ref 80.0–100.0)
Monocytes Absolute: 0.4 10*3/uL (ref 0.1–1.0)
Monocytes Relative: 6 %
Neutro Abs: 5.3 10*3/uL (ref 1.7–7.7)
Neutrophils Relative %: 74 %
Platelets: 163 10*3/uL (ref 150–400)
RBC: 4.47 MIL/uL (ref 3.87–5.11)
RDW: 13.2 % (ref 11.5–15.5)
WBC: 7.1 10*3/uL (ref 4.0–10.5)
nRBC: 0 % (ref 0.0–0.2)

## 2019-05-03 LAB — SALICYLATE LEVEL: Salicylate Lvl: <7 mg/dL — ABNORMAL LOW (ref 7.0–30.0)

## 2019-05-03 LAB — COMPREHENSIVE METABOLIC PANEL
ALT: 31 U/L (ref 0–44)
AST: 16 U/L (ref 15–41)
Albumin: 4.5 g/dL (ref 3.5–5.0)
Alkaline Phosphatase: 38 U/L (ref 38–126)
Anion gap: 14 (ref 5–15)
BUN: 17 mg/dL (ref 6–20)
CO2: 19 mmol/L — ABNORMAL LOW (ref 22–32)
Calcium: 9.7 mg/dL (ref 8.9–10.3)
Chloride: 107 mmol/L (ref 98–111)
Creatinine, Ser: 0.66 mg/dL (ref 0.44–1.00)
GFR calc Af Amer: >60 mL/min (ref >60)
GFR calc non Af Amer: >60 mL/min (ref >60)
Glucose, Bld: 125 mg/dL — ABNORMAL HIGH (ref 70–99)
Potassium: 3.5 mmol/L (ref 3.5–5.1)
Sodium: 140 mmol/L (ref 135–145)
Total Bilirubin: 0.8 mg/dL (ref 0.3–1.2)
Total Protein: 7.5 g/dL (ref 6.5–8.1)

## 2019-05-03 LAB — RESPIRATORY PANEL BY RT PCR (FLU A&B, COVID)
Influenza A by PCR: NEGATIVE
Influenza B by PCR: NEGATIVE
SARS Coronavirus 2 by RT PCR: NEGATIVE

## 2019-05-03 LAB — RAPID URINE DRUG SCREEN, HOSP PERFORMED
Amphetamines: NOT DETECTED
Barbiturates: NOT DETECTED
Benzodiazepines: POSITIVE — AB
Cocaine: NOT DETECTED
Opiates: NOT DETECTED
Tetrahydrocannabinol: NOT DETECTED

## 2019-05-03 LAB — I-STAT BETA HCG BLOOD, ED (MC, WL, AP ONLY): I-stat hCG, quantitative: <5 m[IU]/mL (ref <5)

## 2019-05-03 LAB — ETHANOL: Alcohol, Ethyl (B): <10 mg/dL (ref <10)

## 2019-05-03 LAB — ACETAMINOPHEN LEVEL: Acetaminophen (Tylenol), Serum: <10 ug/mL — ABNORMAL LOW (ref 10–30)

## 2019-05-03 LAB — CBG MONITORING, ED: Glucose-Capillary: 132 mg/dL — ABNORMAL HIGH (ref 70–99)

## 2019-05-03 MED ORDER — ACTIDOSE WITH SORBITOL 50 GM/240ML PO LIQD
50.0000 g | Freq: Once | ORAL | Status: AC
  Administered 2019-05-03: 12:00:00 50 g via ORAL
  Filled 2019-05-03: qty 240

## 2019-05-03 MED ORDER — CHARCOAL ACTIVATED PO LIQD: 50.0000 g | Freq: Once | ORAL | Status: DC

## 2019-05-03 NOTE — Telephone Encounter (Signed)
Holding response as patient is in hospital currently.

## 2019-05-03 NOTE — Telephone Encounter (Signed)
Noted  

## 2019-05-03 NOTE — Telephone Encounter (Signed)
Will hold any refills, especially of controlled substances at this time as patient is in ER.

## 2019-05-03 NOTE — Telephone Encounter (Signed)
Patient is currently in the ED after suicide attempt with valium overdose. Just FYI to hold any pending messages/communication at this time. I am not going to respond to this particular patient message as I do not think it will be beneficial to her at this time and am also not sure what she is referring to completely in her message.
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
## 2019-05-03 NOTE — ED Notes (Signed)
Patient alert/oriented , having appropriate conversation
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
## 2019-05-03 NOTE — Progress Notes (Signed)
Patient meets inpatient criteria per Assunta Found, NP. Patient has been faxed out to the following facilities for review:   [facility]  [facility]  [facility]  [facility]  [facility]  [facility]  [facility]  CSW will continue to follow and assist with securing bed placement.   Drucilla Schmidt, MSW, LCSW-A Clinical Disposition Social Worker [facility]/TTS [phone]

## 2019-05-03 NOTE — Telephone Encounter (Signed)
See previous provider note; patient currently in ED. Will hold responses.

## 2019-05-03 NOTE — ED Notes (Signed)
Patient up to bedside commode. Notified of need for Urine Sample.
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
## 2019-05-03 NOTE — ED Notes (Signed)
Pure wick placed on patient as she is too lethargic to ambulate to bathroom. Patient refuses to use purewick and states she wants to walk to bathroom. Educated patient that ambulating to bathroom at this time is unsafe.

## 2019-05-03 NOTE — Telephone Encounter (Signed)
Noted. Patient is in hospital currently.

## 2019-05-03 NOTE — ED Provider Notes (Signed)
Brief update note  34 year old lady presenting to ER with intentional Valium overdose.  Medically cleared.  Psychiatry recommending inpatient admission.  Plan to place in psych hold while awaiting bed placement.   Milagros Loll, MD 05/03/19 2358

## 2019-05-03 NOTE — ED Notes (Signed)
Patient belongings in to bags with her labeled name on them in cabinet resa , also patient has pink and black make up bag with all kinds of medicine given it to the nurse patty, her pink cell phone is also in the bag, along with pink watch in personal belongings bag.

## 2019-05-04 ENCOUNTER — Encounter (HOSPITAL_COMMUNITY): Payer: Self-pay | Admitting: Registered Nurse

## 2019-05-04 ENCOUNTER — Telehealth: Payer: Self-pay | Admitting: Family Medicine

## 2019-05-04 ENCOUNTER — Encounter: Payer: Self-pay | Admitting: Family Medicine

## 2019-05-04 ENCOUNTER — Other Ambulatory Visit: Payer: Self-pay | Admitting: Obstetrics and Gynecology

## 2019-05-04 DIAGNOSIS — T424X1A Poisoning by benzodiazepines, accidental (unintentional), initial encounter: Secondary | ICD-10-CM | POA: Insufficient documentation

## 2019-05-04 DIAGNOSIS — F4329 Adjustment disorder with other symptoms: Secondary | ICD-10-CM

## 2019-05-04 DIAGNOSIS — T424X2A Poisoning by benzodiazepines, intentional self-harm, initial encounter: Secondary | ICD-10-CM

## 2019-05-04 MED ORDER — HYDROCHLOROTHIAZIDE 12.5 MG PO CAPS
12.5000 mg | ORAL_CAPSULE | Freq: Every day | ORAL | Status: DC
  Administered 2019-05-04: 12.5 mg via ORAL
  Filled 2019-05-04: qty 1

## 2019-05-04 MED ORDER — DULOXETINE HCL 30 MG PO CPEP
90.0000 mg | ORAL_CAPSULE | Freq: Every day | ORAL | Status: DC
  Administered 2019-05-04: 11:00:00 90 mg via ORAL
  Filled 2019-05-04: qty 3

## 2019-05-04 MED ORDER — MEGESTROL ACETATE 40 MG PO TABS
40.0000 mg | ORAL_TABLET | Freq: Every day | ORAL | Status: DC
  Administered 2019-05-04: 11:00:00 40 mg via ORAL
  Filled 2019-05-04 (×2): qty 1

## 2019-05-04 MED ORDER — LISINOPRIL 20 MG PO TABS
20.0000 mg | ORAL_TABLET | Freq: Every day | ORAL | Status: DC
  Administered 2019-05-04: 20 mg via ORAL
  Filled 2019-05-04: qty 1
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
## 2019-05-04 NOTE — ED Notes (Signed)
Pt asked to call her father. She was shown how to use the room phone. She asked about her daily meds, I made her aware that we are waiting for pharmacy to send the Megace. She agrees to this plan.

## 2019-05-04 NOTE — BH Assessment (Signed)
[facility] Assessment Progress Note  Per Shuvon Rankin, FNP, this pt does not require psychiatric hospitalization at this time.  Pt is to be discharged from [facility] with recommendation to continue treatment with Milagros Evener, MD and her regular outpatient therapist.  This has been included in pt's discharge instructions.  Pt's nurse has been notified.  Doylene Canning, MA Triage Specialist [phone]

## 2019-05-04 NOTE — Telephone Encounter (Signed)
Pt stated she will only discuss with Koberlein and does not want to relay the info to anyone else. She would like for Koberlein to give her a call at [phone]   Offered pt a virtual appt for Friday but pt declined

## 2019-05-04 NOTE — ED Provider Notes (Signed)
Emergency Medicine Observation Re-evaluation Note  Katie [name] is a 34 y.o. female, seen on rounds today.  Pt initially presented to the ED for complaints of Drug Overdose and Suicidal Currently, the patient is stable and medically cleared.  Physical Exam  BP 125/77 (BP Location: Left Arm)   Pulse 88   Temp 99.1 F (37.3 C) (Oral)   Resp 16   Ht 5\' 6"  (1.676 m)   Wt 108 kg   SpO2 98%   BMI 38.41 kg/m  Physical Exam  ED Course / MDM  EKG:EKG Interpretation  Date/Time:  Tuesday May 03 2019 11:46:26 EDT Ventricular Rate:  114 PR Interval:    QRS Duration: 105 QT Interval:  308 QTC Calculation: 425 R Axis:   40 Text Interpretation: Sinus tachycardia Borderline T abnormalities, diffuse leads Confirmed by [id number] (Marianna Fuss) on 05/03/2019 4:42:55 PM  Clinical Course as of May 03 816  Tue May 03, 2019  1330 Patient reassessed.  Labs are reassuring.  She is sleepy but arousable and answers all questions appropriately.  End-tidal CO2 is less than 30.   [AN]  1401 Patient reassessed.  End-tidal CO2 is 32.  Mental status has not changed.   [AN]  1401 Charcoal was administered right after the order several minutes ago without any complications.   [AN]  1407 Discussed case with poison control.  They recommend that patient needs to be observed 6 hours or until she is back to baseline until medically cleared.  For 6 hours is a likely timeframe when patient can possibly decline.  The half-life of Valium is over 48 hours.  Our plan is to observe patient for at least 6 hours before she can be medically cleared.  We will continue to monitor her, but if she passes the 6-hour mark then the chances of decompensation is significantly reduced.   [AN]    Clinical Course User Index [AN] May 05, 2019, MD   I have reviewed the labs performed to date as well as medications administered while in observation.  Recent changes in the last 24 hours include improvement in mental  status, TTS consult. Plan  Current plan is for inpatient bed search. Patient is not under full IVC at this time.   Derwood Kaplan, MD 05/04/19 1755

## 2019-05-04 NOTE — ED Notes (Signed)
Discharge instructions explained to patient, she verbalized understanding.  Denies SI at this time.  Patient calling family to pick her up.  Patient asking for copies of medical records, directed her to go to medical records to request those in writing.  Patient verbalized understanding.

## 2019-05-04 NOTE — Discharge Instructions (Signed)
For your behavioral health needs, you are advised to continue treatment with Milagros Evener, MD, and your regular outpatient therapist:       Milagros Evener, MD      [address]      [place], [place] [address]      [phone]

## 2019-05-04 NOTE — ED Notes (Deleted)
Patient speaking with TTS.  

## 2019-05-05 ENCOUNTER — Encounter: Payer: Self-pay | Admitting: Family Medicine

## 2019-05-05 ENCOUNTER — Other Ambulatory Visit: Payer: Self-pay | Admitting: Obstetrics and Gynecology

## 2019-05-05 NOTE — Telephone Encounter (Signed)
I called patient 4/21 evening and it went to voicemail. I will message her through mychart to follow up.
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
## 2019-05-05 NOTE — Telephone Encounter (Signed)
Can you help guide her to how to make her chart private/locked? I'm not sure who she contacts for this. Thanks!

## 2019-05-05 NOTE — Telephone Encounter (Signed)
We have a 9am on Friday available. There is a 3:30 but it is a short appointment slot and I'm not sure it will give [place] enough time. There are some openings next week as well and if she doesn't want virtual I am ok with converting to in office but would prefer 30 minutes please.  If she needs to talk before appointment let me know. I tried to call her yesterday but it went to voicemail.

## 2019-05-06 ENCOUNTER — Encounter: Payer: Self-pay | Admitting: Family Medicine

## 2019-05-06 ENCOUNTER — Telehealth (INDEPENDENT_AMBULATORY_CARE_PROVIDER_SITE_OTHER): Payer: BC Managed Care – PPO | Admitting: Family Medicine

## 2019-05-06 ENCOUNTER — Other Ambulatory Visit: Payer: Self-pay | Admitting: Obstetrics and Gynecology

## 2019-05-06 VITALS — BP 102/80 | Wt 228.0 lb

## 2019-05-06 DIAGNOSIS — F333 Major depressive disorder, recurrent, severe with psychotic symptoms: Secondary | ICD-10-CM | POA: Diagnosis not present

## 2019-05-06 DIAGNOSIS — M436 Torticollis: Secondary | ICD-10-CM | POA: Diagnosis not present

## 2019-05-06 DIAGNOSIS — N8502 Endometrial intraepithelial neoplasia [EIN]: Secondary | ICD-10-CM

## 2019-05-06 DIAGNOSIS — F419 Anxiety disorder, unspecified: Secondary | ICD-10-CM | POA: Diagnosis not present

## 2019-05-06 NOTE — Telephone Encounter (Signed)
Ok for this; can be written for patient so she can access through [facility] if desired. No details required on work note.

## 2019-05-09 ENCOUNTER — Telehealth: Payer: Self-pay | Admitting: *Deleted

## 2019-05-09 ENCOUNTER — Telehealth: Payer: Self-pay | Admitting: Family Medicine

## 2019-05-09 NOTE — Telephone Encounter (Signed)
Patient is wanting a call about when she could expect to hear from Dr Hassan Rowan about if she was able to contact one of her other doctors.  Ph: [phone]

## 2019-05-09 NOTE — Telephone Encounter (Signed)
-----   Message from Wynn Banker, MD sent at 05/06/2019  9:44 AM EDT ----- Could you please set up lab visit for her

## 2019-05-09 NOTE — Telephone Encounter (Signed)
Spoke with the pt and scheduled a lab appt for 4/28 to arrive at 3:10pm.

## 2019-05-09 NOTE — Telephone Encounter (Signed)
-----   Message from Wynn Banker, MD sent at 05/06/2019 12:40 PM EDT ----- I printed info from up to date on endometrial hyperplasia for her. Could you put up front for her to pick up when she has bloodwork and remind her this is up front for her? Thanks!

## 2019-05-09 NOTE — Telephone Encounter (Signed)
Pt called to check to see if Koberlein was able to get in touch with Dr. Juliene Pina.   Pt can be reached at [phone]

## 2019-05-09 NOTE — Telephone Encounter (Signed)
Patient is aware the info was left at the front desk for pick up when she comes in for the lab appt on 4/28.

## 2019-05-10 ENCOUNTER — Encounter: Payer: Self-pay | Admitting: Pulmonary Disease

## 2019-05-10 ENCOUNTER — Other Ambulatory Visit: Payer: Self-pay

## 2019-05-10 ENCOUNTER — Ambulatory Visit (INDEPENDENT_AMBULATORY_CARE_PROVIDER_SITE_OTHER): Payer: BC Managed Care – PPO | Admitting: Pulmonary Disease

## 2019-05-10 VITALS — BP 106/66 | HR 94 | Temp 97.2°F | Ht 66.0 in | Wt 233.6 lb

## 2019-05-10 DIAGNOSIS — G4733 Obstructive sleep apnea (adult) (pediatric): Secondary | ICD-10-CM
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
## 2019-05-10 NOTE — Patient Instructions (Signed)
High probability of significant obstructive sleep apnea  We will set you up with a home sleep study Update your results as soon as they become available  Follow-up in about 3 months  Options of treatment as discussed-CPAP will be a good option of treatment  Call with significant concerns Sleep Apnea Sleep apnea is a condition in which breathing pauses or becomes shallow during sleep. Episodes of sleep apnea usually last 10 seconds or longer, and they may occur as many as 20 times an hour. Sleep apnea disrupts your sleep and keeps your body from getting the rest that it needs. This condition can increase your risk of certain health problems, including:  Heart attack.  Stroke.  Obesity.  Diabetes.  Heart failure.  Irregular heartbeat. What are the causes? There are three kinds of sleep apnea:  Obstructive sleep apnea. This kind is caused by a blocked or collapsed airway.  Central sleep apnea. This kind happens when the part of the brain that controls breathing does not send the correct signals to the muscles that control breathing.  Mixed sleep apnea. This is a combination of obstructive and central sleep apnea. The most common cause of this condition is a collapsed or blocked airway. An airway can collapse or become blocked if:  Your throat muscles are abnormally relaxed.  Your tongue and tonsils are larger than normal.  You are overweight.  Your airway is smaller than normal. What increases the risk? You are more likely to develop this condition if you:  Are overweight.  Smoke.  Have a smaller than normal airway.  Are elderly.  Are female.  Drink alcohol.  Take sedatives or tranquilizers.  Have a family history of sleep apnea. What are the signs or symptoms? Symptoms of this condition include:  Trouble staying asleep.  Daytime sleepiness and tiredness.  Irritability.  Loud snoring.  Morning headaches.  Trouble  concentrating.  Forgetfulness.  Decreased interest in sex.  Unexplained sleepiness.  Mood swings.  Personality changes.  Feelings of depression.  Waking up often during the night to urinate.  Dry mouth.  Sore throat. How is this diagnosed? This condition may be diagnosed with:  A medical history.  A physical exam.  A series of tests that are done while you are sleeping (sleep study). These tests are usually done in a sleep lab, but they may also be done at home. How is this treated? Treatment for this condition aims to restore normal breathing and to ease symptoms during sleep. It may involve managing health issues that can affect breathing, such as high blood pressure or obesity. Treatment may include:  Sleeping on your side.  Using a decongestant if you have nasal congestion.  Avoiding the use of depressants, including alcohol, sedatives, and narcotics.  Losing weight if you are overweight.  Making changes to your diet.  Quitting smoking.  Using a device to open your airway while you sleep, such as: ? An oral appliance. This is a custom-made mouthpiece that shifts your lower jaw forward. ? A continuous positive airway pressure (CPAP) device. This device blows air through a mask when you breathe out (exhale). ? A nasal expiratory positive airway pressure (EPAP) device. This device has valves that you put into each nostril. ? A bi-level positive airway pressure (BPAP) device. This device blows air through a mask when you breathe in (inhale) and breathe out (exhale).  Having surgery if other treatments do not work. During surgery, excess tissue is removed to create a wider airway.  It is important to get treatment for sleep apnea. Without treatment, this condition can lead to:  High blood pressure.  Coronary artery disease.  In men, an inability to achieve or maintain an erection (impotence).  Reduced thinking abilities. Follow these instructions at  home: Lifestyle  Make any lifestyle changes that your health care provider recommends.  Eat a healthy, well-balanced diet.  Take steps to lose weight if you are overweight.  Avoid using depressants, including alcohol, sedatives, and narcotics.  Do not use any products that contain nicotine or tobacco, such as cigarettes, e-cigarettes, and chewing tobacco. If you need help quitting, ask your health care provider. General instructions  Take over-the-counter and prescription medicines only as told by your health care provider.  If you were given a device to open your airway while you sleep, use it only as told by your health care provider.  If you are having surgery, make sure to tell your health care provider you have sleep apnea. You may need to bring your device with you.  Keep all follow-up visits as told by your health care provider. This is important. Contact a health care provider if:  The device that you received to open your airway during sleep is uncomfortable or does not seem to be working.  Your symptoms do not improve.  Your symptoms get worse. Get help right away if:  You develop: ? Chest pain. ? Shortness of breath. ? Discomfort in your back, arms, or stomach.  You have: ? Trouble speaking. ? Weakness on one side of your body. ? Drooping in your face. These symptoms may represent a serious problem that is an emergency. Do not wait to see if the symptoms will go away. Get medical help right away. Call your local emergency services (911 in the [place] Do not drive yourself to the hospital. Summary  Sleep apnea is a condition in which breathing pauses or becomes shallow during sleep.  The most common cause is a collapsed or blocked airway.  The goal of treatment is to restore normal breathing and to ease symptoms during sleep. This information is not intended to replace advice given to you by your health care provider. Make sure you discuss any questions you  have with your health care provider. Document Revised: 06/16/2018 Document Reviewed: 08/25/2017 Elsevier Patient Education  2020 [employer].

## 2019-05-11 ENCOUNTER — Encounter: Payer: Self-pay | Admitting: Family Medicine

## 2019-05-11 ENCOUNTER — Other Ambulatory Visit (INDEPENDENT_AMBULATORY_CARE_PROVIDER_SITE_OTHER): Payer: BC Managed Care – PPO

## 2019-05-11 DIAGNOSIS — E559 Vitamin D deficiency, unspecified: Secondary | ICD-10-CM

## 2019-05-11 DIAGNOSIS — D649 Anemia, unspecified: Secondary | ICD-10-CM | POA: Diagnosis not present

## 2019-05-11 DIAGNOSIS — R739 Hyperglycemia, unspecified: Secondary | ICD-10-CM

## 2019-05-11 DIAGNOSIS — R5383 Other fatigue: Secondary | ICD-10-CM

## 2019-05-11 LAB — VITAMIN D 25 HYDROXY (VIT D DEFICIENCY, FRACTURES): VITD: 17.84 ng/mL — ABNORMAL LOW (ref 30.00–100.00)

## 2019-05-11 LAB — VITAMIN B12: Vitamin B-12: 457 pg/mL (ref 211–911)

## 2019-05-11 LAB — IBC + FERRITIN
Ferritin: 113.7 ng/mL (ref 10.0–291.0)
Iron: 51 ug/dL (ref 42–145)
Saturation Ratios: 16.7 % — ABNORMAL LOW (ref 20.0–50.0)
Transferrin: 218 mg/dL (ref 212.0–360.0)

## 2019-05-11 LAB — HEMOGLOBIN A1C: Hgb A1c MFr Bld: 5.5 % (ref 4.6–6.5)

## 2019-05-11 NOTE — Telephone Encounter (Signed)
Addressed through mychart

## 2019-05-12 ENCOUNTER — Other Ambulatory Visit: Payer: Self-pay | Admitting: Family Medicine

## 2019-05-12 ENCOUNTER — Encounter: Payer: Self-pay | Admitting: Family Medicine

## 2019-05-12 LAB — CBC WITH DIFFERENTIAL/PLATELET
Basophils Absolute: 0 10*3/uL (ref 0.0–0.1)
Basophils Relative: 0.3 % (ref 0.0–3.0)
Eosinophils Absolute: 0 10*3/uL (ref 0.0–0.7)
Eosinophils Relative: 0.1 % (ref 0.0–5.0)
HCT: 35 % — ABNORMAL LOW (ref 36.0–46.0)
Hemoglobin: 11.9 g/dL — ABNORMAL LOW (ref 12.0–15.0)
Lymphocytes Relative: 29.4 % (ref 12.0–46.0)
Lymphs Abs: 2 10*3/uL (ref 0.7–4.0)
MCHC: 34.1 g/dL (ref 30.0–36.0)
MCV: 90.7 fl (ref 78.0–100.0)
Monocytes Absolute: 0.4 10*3/uL (ref 0.1–1.0)
Monocytes Relative: 5.4 % (ref 3.0–12.0)
Neutro Abs: 4.4 10*3/uL (ref 1.4–7.7)
Neutrophils Relative %: 64.8 % (ref 43.0–77.0)
Platelets: 175 10*3/uL (ref 150.0–400.0)
RBC: 3.85 Mil/uL — ABNORMAL LOW (ref 3.87–5.11)
RDW: 14.3 % (ref 11.5–15.5)
WBC: 6.8 10*3/uL (ref 4.0–10.5)

## 2019-05-12 MED ORDER — VITAMIN D (ERGOCALCIFEROL) 1.25 MG (50000 UNIT) PO CAPS: 50000.0000 [IU] | ORAL_CAPSULE | ORAL | 0 refills | Status: DC

## 2019-05-13 ENCOUNTER — Encounter: Payer: Self-pay | Admitting: Family Medicine

## 2019-05-16 ENCOUNTER — Encounter: Payer: Self-pay | Admitting: Family Medicine

## 2019-05-18 ENCOUNTER — Encounter: Payer: Self-pay | Admitting: Family Medicine

## 2019-05-18 ENCOUNTER — Telehealth: Payer: Self-pay | Admitting: Pulmonary Disease

## 2019-05-18 NOTE — Telephone Encounter (Signed)
Spoke with patient. She stated that she was requesting a refill on all of her medications. I advised her that [facility] did not prescribe any medications and suggested that she follow up with her PCP. She verbalized understanding.   Nothing further needed at time of call.

## 2019-05-20 ENCOUNTER — Encounter: Payer: Self-pay | Admitting: Family Medicine

## 2019-05-20 ENCOUNTER — Telehealth: Payer: Self-pay | Admitting: Family Medicine

## 2019-05-20 NOTE — Telephone Encounter (Signed)
The patient wants to know what is the next step in care. She said she hadn't heard anything about getting a Mri the [facility] [employer] said she needed it. She called [facility] was told she would have to talk to Dr Hassan Rowan office Pt contact number  [phone]

## 2019-05-23 NOTE — Telephone Encounter (Signed)
Addressed through My Chart 

## 2019-05-24 ENCOUNTER — Other Ambulatory Visit (HOSPITAL_COMMUNITY): Payer: Self-pay | Admitting: Chiropractic Medicine

## 2019-05-24 DIAGNOSIS — M542 Cervicalgia: Secondary | ICD-10-CM

## 2019-05-25 ENCOUNTER — Encounter: Payer: Self-pay | Admitting: Family Medicine

## 2019-05-25 ENCOUNTER — Other Ambulatory Visit: Payer: Self-pay | Admitting: Family Medicine

## 2019-05-25 MED ORDER — VIBERZI 100 MG PO TABS: 1.0000 | ORAL_TABLET | Freq: Two times a day (BID) | ORAL | 2 refills | Status: DC

## 2019-05-26 ENCOUNTER — Encounter: Payer: Self-pay | Admitting: Family Medicine

## 2019-05-26 DIAGNOSIS — F603 Borderline personality disorder: Secondary | ICD-10-CM

## 2019-05-26 HISTORY — DX: Borderline personality disorder: F60.3

## 2019-05-26 NOTE — Telephone Encounter (Signed)
I called patient and spoke with her after seeing that she had sent multiple messages in short period of time. She stated that she was safe, when asked, and did not have any intent on hurting self because she wants to be there for her daughter. She continued on with same concerns regarding providers that she continues to mention.  I was able to tell her that it appeared a sedated MRI of the neck had been ordered, so she should hear about scheduling for this soon.  I also asked Capucine if it would be okay if I called Dr. Evelene Croon, to which she agreed. I was able to speak with Dr. Evelene Croon who summarized for me that Kale's current patterns of distressed and dislike with providers has been ongoing for years.  She has been caring for Carnella since she was 34 years old.  She states that there is a lot of dysfunction within the family, and that unfortunately Tameyah has not been an active participant in any therapy that was suggested in order to help her with her condition of borderline personality.  She did not feel there is anything more that I could do from the primary care standpoint, and advised that any concerns regarding mood/disagreements with other providers/etc. be referred back to her.

## 2019-05-28 ENCOUNTER — Encounter: Payer: Self-pay | Admitting: Family Medicine

## 2019-05-28 ENCOUNTER — Other Ambulatory Visit: Payer: Self-pay | Admitting: Family Medicine

## 2019-05-29 ENCOUNTER — Encounter: Payer: Self-pay | Admitting: Family Medicine

## 2019-05-29 NOTE — Telephone Encounter (Signed)
Addressed in separate message

## 2019-05-30 ENCOUNTER — Encounter: Payer: Self-pay | Admitting: Family Medicine

## 2019-05-30 NOTE — Telephone Encounter (Signed)
Pt is calling in stating that she has not been receiving a reply back from any of her messages and would like to see if she can get a call back.  Pt is aware that Dr. Hassan Rowan was out of the office on Friday and that she is answering her msg as they had come in.

## 2019-05-31 NOTE — Telephone Encounter (Signed)
Addressed in separate my chart message

## 2019-06-02 ENCOUNTER — Telehealth: Payer: Self-pay | Admitting: Family Medicine

## 2019-06-02 NOTE — Telephone Encounter (Signed)
Pt received a message about scheduling an appt with Dr. Hassan Rowan. Pt is requesting to speak to you regarding why she needs to be seen in person and has a medication question. Pt requested a message be sent and did not give any further information. Thanks

## 2019-06-03 ENCOUNTER — Other Ambulatory Visit: Payer: Self-pay

## 2019-06-03 NOTE — Telephone Encounter (Signed)
Spoke with the pt and informed her of the message below.  Patient stated she does not have a scale at home.  Appt scheduled for 5/24 at 10:30am.

## 2019-06-03 NOTE — Telephone Encounter (Signed)
Please see about getting her appointment sooner than that scheduled in June.

## 2019-06-03 NOTE — Telephone Encounter (Signed)
Spoke with the pt and informed her of the message below.  Message sent to [facility] to schedule the pt on Monday 5/24 in the 10:30am slot.  Patient is aware to arrive at 10:15am.

## 2019-06-03 NOTE — Telephone Encounter (Signed)
If we are giving phentermine to help with weight loss, then we need an updated weight on her. If she has a functioning scale at home, then I suppose that virtual visit would be ok.

## 2019-06-04 ENCOUNTER — Other Ambulatory Visit (HOSPITAL_COMMUNITY)
Admission: RE | Admit: 2019-06-04 | Discharge: 2019-06-04 | Disposition: A | Discharge status: Home / Self Care | Payer: BC Managed Care – PPO | Source: Ambulatory Visit | Attending: Chiropractic Medicine | Admitting: Chiropractic Medicine

## 2019-06-04 DIAGNOSIS — Z20822 Contact with and (suspected) exposure to covid-19: Secondary | ICD-10-CM | POA: Diagnosis not present

## 2019-06-04 DIAGNOSIS — Z01812 Encounter for preprocedural laboratory examination: Secondary | ICD-10-CM | POA: Insufficient documentation

## 2019-06-04 LAB — SARS CORONAVIRUS 2 (TAT 6-24 HRS): SARS Coronavirus 2: NEGATIVE

## 2019-06-06 ENCOUNTER — Ambulatory Visit (INDEPENDENT_AMBULATORY_CARE_PROVIDER_SITE_OTHER): Payer: BC Managed Care – PPO | Admitting: Family Medicine

## 2019-06-06 ENCOUNTER — Encounter: Payer: Self-pay | Admitting: Family Medicine

## 2019-06-06 ENCOUNTER — Encounter (HOSPITAL_COMMUNITY): Payer: Self-pay | Admitting: *Deleted

## 2019-06-06 ENCOUNTER — Other Ambulatory Visit: Payer: Self-pay

## 2019-06-06 VITALS — BP 116/82 | HR 102 | Temp 98.0°F | Ht 66.0 in | Wt 224.2 lb

## 2019-06-06 DIAGNOSIS — E669 Obesity, unspecified: Secondary | ICD-10-CM

## 2019-06-06 DIAGNOSIS — I10 Essential (primary) hypertension: Secondary | ICD-10-CM | POA: Diagnosis not present

## 2019-06-06 DIAGNOSIS — M436 Torticollis: Secondary | ICD-10-CM

## 2019-06-06 DIAGNOSIS — N8502 Endometrial intraepithelial neoplasia [EIN]: Secondary | ICD-10-CM

## 2019-06-06 MED ORDER — PHENTERMINE HCL 37.5 MG PO TABS: 18.7500 mg | ORAL_TABLET | Freq: Every day | ORAL | 1 refills | Status: DC

## 2019-06-06 MED ORDER — KETOROLAC TROMETHAMINE 60 MG/2ML IM SOLN
60.0000 mg | Freq: Once | INTRAMUSCULAR | Status: AC
  Administered 2019-06-06: 60 mg via INTRAMUSCULAR

## 2019-06-06 MED ORDER — PHENTERMINE HCL 37.5 MG PO CAPS
37.5000 mg | ORAL_CAPSULE | ORAL | 1 refills | Status: DC
Start: 2019-06-06 — End: 2019-10-14

## 2019-06-06 NOTE — Progress Notes (Signed)
Katie [name] DOB: 07-Sep-1985 Encounter date: 06/06/2019  This is a 34 y.o. female who presents with Chief Complaint  Patient presents with  . Follow-up    History of present illness: Interested in phentermine. She has been on megace through gyn and it really increases her appetite. She just had endometrial biopsies today. She is in a lot of pain right now - between neck and her cramping from procedure. Didn't pre-medicate.   Tomorrow morning has sedated MRI. She is having to use vacation days to complete this. Had to have the COVID test done in advance as well so has used a couple of vacation days.   Has had tingling in left shoulder and pain/nerve in left arm. If holding anything in left hand and stands or does something that increases "intracranial pressure" then she gets weak and drops item. Notes if using left arm, getting out of chair, pushing with this, rolling to that side. Has headaches all the time. Not sure if they are worse with these episodes. Has multiple headaches/day.   Eating Katie [name] bites - 3 for breakfast, then string cheese for snack. Salad or frozen low carb meal for lunch, vegetable for dinner. Feels like she is always hungry, but does feel like the phentermine helps. Good about staying hydrated. Drinks at least 64 oz/day.   She does have stationary bike at home. Also does walking for work if she is working [place].    Allergies  Allergen Reactions  . Flexeril [Cyclobenzaprine Hcl] Shortness Of Breath  . Lorazepam     Other reaction(s): Other Neck spasm   No outpatient medications have been marked as taking for the 06/06/19 encounter (Office Visit) with Caren Macadam, MD.    Review of Systems  Constitutional: Positive for fatigue. Negative for chills and fever.  Respiratory: Negative for cough, chest tightness, shortness of breath and wheezing.   Cardiovascular: Negative for chest pain, palpitations and leg swelling.  Gastrointestinal: Positive  for abdominal pain.  Musculoskeletal: Positive for neck pain and neck stiffness.  Neurological: Positive for weakness, numbness and headaches.  Psychiatric/Behavioral: Positive for agitation. Negative for suicidal ideas. The patient is nervous/anxious.     Objective:  BP 116/82 (BP Location: Left Arm, Patient Position: Sitting, Cuff Size: Large)   Pulse (!) 102   Temp 98 F (36.7 C) (Temporal)   Ht 5\' 6"  (1.676 m)   Wt 224 lb 3.2 oz (101.7 kg)   BMI 36.19 kg/m   Weight: 224 lb 3.2 oz (101.7 kg)   BP Readings from Last 3 Encounters:  06/06/19 116/82  05/10/19 106/66  05/06/19 102/80   Wt Readings from Last 3 Encounters:  06/06/19 224 lb 3.2 oz (101.7 kg)  05/10/19 233 lb 9.6 oz (106 kg)  05/06/19 228 lb (103.4 kg)    Physical Exam Constitutional:      General: She is not in acute distress.    Appearance: She is well-developed and overweight.     Comments: Patient is uncomfortable and lying on bed for duration of exam.  She just came from gynecology office where she had a D&C.  Cardiovascular:     Rate and Rhythm: Normal rate and regular rhythm.     Heart sounds: Normal heart sounds. No murmur. No friction rub.  Pulmonary:     Effort: Pulmonary effort is normal. No respiratory distress.     Breath sounds: Normal breath sounds. No wheezing or rales.  Musculoskeletal:     Right lower leg: No edema.  Left lower leg: No edema.     Comments: Torticollis with left rotation.  Patient has continuous muscle spasm of the neck and is unable to give neck still.  Normal grip strength.  Pain with abduction of the left arm.  Tenderness with palpation of the upper left arm.  Neurological:     Mental Status: She is alert and oriented to person, place, and time.     Deep Tendon Reflexes:     Reflex Scores:      Tricep reflexes are 2+ on the right side and 2+ on the left side.      Bicep reflexes are 2+ on the right side and 2+ on the left side.      Brachioradialis reflexes are 2+  on the right side and 2+ on the left side. Psychiatric:        Behavior: Behavior normal.     Assessment/Plan  1. Essential hypertension Blood pressures been well controlled.  Continue current medications.  Discussed with her that if she loses weight, we may need to decrease the strength of blood pressure medication.  Encouraged her to monitor at home.  2. Torticollis, acquired She is set up for imaging on her neck under sedation since she has been unable to complete imaging due to her spasm.  She will be following up with Dr. Ethelene Hal after imaging.  She is having some neuropathy in the left arm currently which I suspect is secondary to the neck spasm and potentially impingement.  Currently on exam there are no notable neurologic findings, just pain.  We will see what Ortho says after her upcoming images.  3. Complex atypical endometrial hyperplasia She is following with gynecology.  She just completed a D&C today.  She is in a lot of discomfort from this as she forgot to premedicate for this procedure.  Toradol injection given in the office.  4. Obesity (BMI 35.0-39.9 without comorbidity) She is working hard at losing weight.  She been following a fairly strict diet.  She does feel that the phentermine helps her in terms of appetite control, especially since she is taking the Megace for the endometrial hyperplasia.  We will continue the phentermine with close monitoring for weight loss.  We have discussed that this is a controlled substance, and I prefer to keep her on this for short a time as possible.  We will have her do a follow-up in 2 months time.    Additionally today, we discussed concerns that she had through patient messaging.  She has been concerned about not being able to get her Valium to help with anxiety.  Her neck spasms have been worse since being off of this medication.  Unfortunately, she was recently hospitalized due to an overdose attempt on the Valium, so her psychiatrist is  unable to prescribe this for her to use on a regular basis.  Unfortunately, for Katie [name], the Valium seems to be only medication that seems to really help with her anxiety.  She was not even taking it as often as prescribed, and worries that anxiety will not be able to be controlled without this medication.  She has not had a good response with the Vistaril which was given as an alternative.  I have spoken with her psychiatrist about this, but unfortunately due to the recent hospitalization and situation, I do not feel that we have a good alternative with regards to benzodiazepine dosing.  She is on Cymbalta.  I suspect this is helping some with  nerve pain coming from the neck as well as anxiety and depression.  I encouraged her strongly to return to her psychiatrist, although she is hesitant to do so I brought up the diagnosis of borderline personality disorder which was brought up when I spoke with her psychiatrist by phone recently.  Since Naylin was unaware of this diagnosis, she was upset to hear it from me and does not recall discussing this with her psychiatrist, although psychiatry has been encouraging her to participate in therapy targeted towards this diagnosis.  Patient struggles with being able to trust providers, but I am trying to encourage her to return to those providers who know her, including her psychiatrist has been seeing her for years.  I think that her mental health will best be handled to somebody who knows her in her history well.  I encouraged her to discuss the diagnosis of borderline personality disorder with her psychiatrist.  We reviewed this diagnosis today this patient feels like she is just being labeled as "crazy", but I discussed that this diagnosis does incorporate some of the struggles that she has had including trust with relationships, poor self-esteem, etc.  Return in about 2 months (around 08/06/2019) for Chronic condition visit.  50 minutes spent in total with visit,  exam, charting, patient discussion above.  Theodis Shove, MD

## 2019-06-06 NOTE — Progress Notes (Addendum)
Katie [name] denies chest pain or shortness of breath. Patient tested negative for Covid_5/22_ and has been in quarantine since that time. Patient did go to Dr's appointment today, wore a mask , stayed 6 feet away and washed her hands.  Katie [name] has prediabetes. Patient reports that she rarely checks CBG, when she has , it ran 88- 116.  I instructed patient to check CBG after awaking and every 2 hours until arrival  to the hospital.  I Instructed patient if CBG is less than 70 to take 4 Glucose Tablets or 1 tube of Glucose Gel or 1/2 cup of a clear juice. Recheck CBG in 15 minutes then call pre- op desk at [phone] for further instructions. Katie [name] has none of the above, she said that she does not take in many carbs, only natural ones in vegetables.  I instructed patient to eat a snack before bedtime with protein and complex carbs. Patient repeated that she does not eat carbs. I asked that she pick up glucose tabs or glucose gel and take if needed and I gave instructions on each.  Patient was witting information down.  I spoke phenerterine with Dr. Michelle Piper and informed him that Katie [name] has is on Phentermine, Dr Michelle Piper said that it is ok. I notified  Katie [name].

## 2019-06-06 NOTE — Anesthesia Preprocedure Evaluation (Addendum)
Anesthesia Evaluation  Patient identified by MRN, date of birth, ID band Patient awake    Reviewed: Allergy & Precautions, H&P , NPO status , Patient's Chart, lab work & pertinent test results  Airway Mallampati: II  TM Distance: >3 FB Neck ROM: Full    Dental no notable dental hx. (+) Teeth Intact, Dental Advisory Given   Pulmonary asthma , former smoker,    Pulmonary exam normal breath sounds clear to auscultation       Cardiovascular Exercise Tolerance: Good hypertension, Pt. on medications  Rhythm:Regular Rate:Normal     Neuro/Psych Anxiety Depression negative neurological ROS     GI/Hepatic negative GI ROS, Neg liver ROS,   Endo/Other  Morbid obesity  Renal/GU negative Renal ROS  negative genitourinary   Musculoskeletal  (+) Arthritis , Osteoarthritis,  Fibromyalgia -  Abdominal   Peds  Hematology negative hematology ROS (+)   Anesthesia Other Findings   Reproductive/Obstetrics negative OB ROS                            Anesthesia Physical Anesthesia Plan  ASA: III  Anesthesia Plan: General   Post-op Pain Management:    Induction: Intravenous  PONV Risk Score and Plan: 3 and Ondansetron, Dexamethasone and Midazolam  Airway Management Planned: LMA  Additional Equipment:   Intra-op Plan:   Post-operative Plan: Extubation in OR  Informed Consent: I have reviewed the patients History and Physical, chart, labs and discussed the procedure including the risks, benefits and alternatives for the proposed anesthesia with the patient or authorized representative who has indicated his/her understanding and acceptance.     Dental advisory given  Plan Discussed with: CRNA  Anesthesia Plan Comments:         Anesthesia Quick Evaluation

## 2019-06-07 ENCOUNTER — Ambulatory Visit (HOSPITAL_COMMUNITY)
Admission: RE | Admit: 2019-06-07 | Discharge: 2019-06-07 | Disposition: A | Discharge status: Home / Self Care | Payer: BC Managed Care – PPO | Source: Ambulatory Visit | Attending: Chiropractic Medicine | Admitting: Chiropractic Medicine

## 2019-06-07 ENCOUNTER — Ambulatory Visit (HOSPITAL_COMMUNITY): Payer: BC Managed Care – PPO | Admitting: Anesthesiology

## 2019-06-07 ENCOUNTER — Ambulatory Visit (HOSPITAL_COMMUNITY)
Admission: RE | Admit: 2019-06-07 | Discharge: 2019-06-07 | Disposition: A | Discharge status: Home / Self Care | Payer: BC Managed Care – PPO | Attending: Chiropractic Medicine | Admitting: Chiropractic Medicine

## 2019-06-07 ENCOUNTER — Encounter (HOSPITAL_COMMUNITY)
Admission: RE | Disposition: A | Discharge status: Home / Self Care | Payer: Self-pay | Source: Home / Self Care | Attending: Chiropractic Medicine

## 2019-06-07 ENCOUNTER — Ambulatory Visit (HOSPITAL_COMMUNITY): Payer: BC Managed Care – PPO

## 2019-06-07 ENCOUNTER — Encounter (HOSPITAL_COMMUNITY): Payer: Self-pay

## 2019-06-07 DIAGNOSIS — F909 Attention-deficit hyperactivity disorder, unspecified type: Secondary | ICD-10-CM | POA: Diagnosis not present

## 2019-06-07 DIAGNOSIS — F603 Borderline personality disorder: Secondary | ICD-10-CM | POA: Insufficient documentation

## 2019-06-07 DIAGNOSIS — Z6836 Body mass index (BMI) 36.0-36.9, adult: Secondary | ICD-10-CM | POA: Diagnosis not present

## 2019-06-07 DIAGNOSIS — M1990 Unspecified osteoarthritis, unspecified site: Secondary | ICD-10-CM | POA: Insufficient documentation

## 2019-06-07 DIAGNOSIS — J45909 Unspecified asthma, uncomplicated: Secondary | ICD-10-CM | POA: Diagnosis not present

## 2019-06-07 DIAGNOSIS — R7303 Prediabetes: Secondary | ICD-10-CM | POA: Insufficient documentation

## 2019-06-07 DIAGNOSIS — F329 Major depressive disorder, single episode, unspecified: Secondary | ICD-10-CM | POA: Diagnosis not present

## 2019-06-07 DIAGNOSIS — F4320 Adjustment disorder, unspecified: Secondary | ICD-10-CM | POA: Diagnosis not present

## 2019-06-07 DIAGNOSIS — F419 Anxiety disorder, unspecified: Secondary | ICD-10-CM | POA: Diagnosis not present

## 2019-06-07 DIAGNOSIS — M4802 Spinal stenosis, cervical region: Secondary | ICD-10-CM | POA: Insufficient documentation

## 2019-06-07 DIAGNOSIS — I10 Essential (primary) hypertension: Secondary | ICD-10-CM | POA: Insufficient documentation

## 2019-06-07 DIAGNOSIS — M542 Cervicalgia: Secondary | ICD-10-CM

## 2019-06-07 DIAGNOSIS — M797 Fibromyalgia: Secondary | ICD-10-CM | POA: Insufficient documentation

## 2019-06-07 DIAGNOSIS — Z87891 Personal history of nicotine dependence: Secondary | ICD-10-CM | POA: Insufficient documentation

## 2019-06-07 HISTORY — DX: Anxiety disorder, unspecified: F41.9

## 2019-06-07 HISTORY — DX: Irritable bowel syndrome, unspecified: K58.9

## 2019-06-07 HISTORY — DX: Fibromyalgia: M79.7

## 2019-06-07 HISTORY — DX: Panic disorder (episodic paroxysmal anxiety): F41.0

## 2019-06-07 HISTORY — PX: RADIOLOGY WITH ANESTHESIA: SHX6223

## 2019-06-07 HISTORY — DX: Depression, unspecified: F32.A

## 2019-06-07 HISTORY — DX: Unspecified osteoarthritis, unspecified site: M19.90

## 2019-06-07 HISTORY — DX: Unspecified asthma, uncomplicated: J45.909

## 2019-06-07 LAB — COMPREHENSIVE METABOLIC PANEL
ALT: 29 U/L (ref 0–44)
AST: 15 U/L (ref 15–41)
Albumin: 4.1 g/dL (ref 3.5–5.0)
Alkaline Phosphatase: 35 U/L — ABNORMAL LOW (ref 38–126)
Anion gap: 10 (ref 5–15)
BUN: 18 mg/dL (ref 6–20)
CO2: 22 mmol/L (ref 22–32)
Calcium: 9.7 mg/dL (ref 8.9–10.3)
Chloride: 109 mmol/L (ref 98–111)
Creatinine, Ser: 0.58 mg/dL (ref 0.44–1.00)
GFR calc Af Amer: >60 mL/min (ref >60)
GFR calc non Af Amer: >60 mL/min (ref >60)
Glucose, Bld: 106 mg/dL — ABNORMAL HIGH (ref 70–99)
Potassium: 4 mmol/L (ref 3.5–5.1)
Sodium: 141 mmol/L (ref 135–145)
Total Bilirubin: 0.7 mg/dL (ref 0.3–1.2)
Total Protein: 6.5 g/dL (ref 6.5–8.1)

## 2019-06-07 LAB — POCT PREGNANCY, URINE: Preg Test, Ur: NEGATIVE

## 2019-06-07 LAB — HEMOGLOBIN: Hemoglobin: 12.4 g/dL (ref 12.0–15.0)

## 2019-06-07 LAB — GLUCOSE, CAPILLARY: Glucose-Capillary: 96 mg/dL (ref 70–99)

## 2019-06-07 SURGERY — MRI WITH ANESTHESIA
Anesthesia: General

## 2019-06-07 MED ORDER — CHLORHEXIDINE GLUCONATE 0.12 % MT SOLN
15.0000 mL | Freq: Once | OROMUCOSAL | Status: AC
  Administered 2019-06-07: 15 mL via OROMUCOSAL
  Filled 2019-06-07: qty 15

## 2019-06-07 MED ORDER — ORAL CARE MOUTH RINSE: 15.0000 mL | Freq: Once | OROMUCOSAL | Status: AC

## 2019-06-07 MED ORDER — ONDANSETRON HCL 4 MG/2ML IJ SOLN
INTRAMUSCULAR | Status: DC | PRN
  Administered 2019-06-07: 4 mg via INTRAVENOUS

## 2019-06-07 MED ORDER — FENTANYL CITRATE (PF) 100 MCG/2ML IJ SOLN
INTRAMUSCULAR | Status: DC | PRN
  Administered 2019-06-07: 50 ug via INTRAVENOUS

## 2019-06-07 MED ORDER — LACTATED RINGERS IV SOLN: INTRAVENOUS | Status: DC

## 2019-06-07 MED ORDER — DEXAMETHASONE SODIUM PHOSPHATE 10 MG/ML IJ SOLN
INTRAMUSCULAR | Status: DC | PRN
Start: 2019-06-07 — End: 2019-06-07
  Administered 2019-06-07: 4 mg via INTRAVENOUS

## 2019-06-07 MED ORDER — MIDAZOLAM HCL 5 MG/5ML IJ SOLN
INTRAMUSCULAR | Status: DC | PRN
  Administered 2019-06-07 (×2): 1 mg via INTRAVENOUS

## 2019-06-07 MED ORDER — PROPOFOL 10 MG/ML IV BOLUS
INTRAVENOUS | Status: DC | PRN
  Administered 2019-06-07: 150 mg via INTRAVENOUS

## 2019-06-07 NOTE — Transfer of Care (Signed)
Immediate Anesthesia Transfer of Care Note  Patient: Katie [name]  Procedure(s) Performed: MRI CERVICAL SPINE WITHOUT CONTRAST (N/A )  Patient Location: PACU  Anesthesia Type:General  Level of Consciousness: awake, alert  and oriented  Airway & Oxygen Therapy: Patient connected to face mask oxygen  Post-op Assessment: Post -op Vital signs reviewed and stable  Post vital signs: stable  Last Vitals:  Vitals Value Taken Time  BP    Temp    Pulse 99 06/07/19 0911  Resp 13 06/07/19 0911  SpO2 100 % 06/07/19 0911  Vitals shown include unvalidated device data.  Last Pain:  Vitals:   06/07/19 0714  TempSrc:   PainSc: 7       Patients Stated Pain Goal: 7 (06/07/19 0714)  Complications: No apparent anesthesia complications

## 2019-06-07 NOTE — Anesthesia Postprocedure Evaluation (Signed)
Anesthesia Post Note  Patient: Keyaira Clapham  Procedure(s) Performed: MRI CERVICAL SPINE WITHOUT CONTRAST (N/A )     Patient location during evaluation: PACU Anesthesia Type: General Level of consciousness: awake and alert Pain management: pain level controlled Vital Signs Assessment: post-procedure vital signs reviewed and stable Respiratory status: spontaneous breathing, nonlabored ventilation and respiratory function stable Cardiovascular status: blood pressure returned to baseline and stable Postop Assessment: no apparent nausea or vomiting Anesthetic complications: no    Last Vitals:  Vitals:   06/07/19 0945 06/07/19 1000  BP: 133/89 (!) 140/95  Pulse: 98 86  Resp: 15 18  Temp: 36.7 C   SpO2: 96% 98%    Last Pain:  Vitals:   06/07/19 0714  TempSrc:   PainSc: 7                  Trystin Hargrove,W. EDMOND

## 2019-06-09 ENCOUNTER — Encounter: Payer: Self-pay | Admitting: Family Medicine

## 2019-06-09 ENCOUNTER — Ambulatory Visit: Payer: BC Managed Care – PPO

## 2019-06-09 ENCOUNTER — Other Ambulatory Visit: Payer: Self-pay

## 2019-06-09 DIAGNOSIS — G4733 Obstructive sleep apnea (adult) (pediatric): Secondary | ICD-10-CM

## 2019-06-09 DIAGNOSIS — R0683 Snoring: Secondary | ICD-10-CM

## 2019-06-10 ENCOUNTER — Encounter: Payer: Self-pay | Admitting: Family Medicine

## 2019-06-14 ENCOUNTER — Encounter: Payer: Self-pay | Admitting: Family Medicine

## 2019-06-14 ENCOUNTER — Telehealth: Payer: Self-pay | Admitting: Pulmonary Disease

## 2019-06-14 DIAGNOSIS — R0683 Snoring: Secondary | ICD-10-CM

## 2019-06-14 NOTE — Telephone Encounter (Signed)
Call patient  Sleep study result  Date of study: 06/09/2019  Impression: Negative study for significant sleep disordered breathing  Recommendation: Sleep position optimization by encouraging sleeping in a lateral position, elevating head of bed by about 30 degrees may help noted snoring  Encouraged weight loss and exercise  Caution against sleepy driving  Follow-up in 4 to 6 weeks 

## 2019-06-15 ENCOUNTER — Telehealth: Payer: Self-pay | Admitting: Family Medicine

## 2019-06-15 NOTE — Telephone Encounter (Signed)
Patient contacted and verbalized understanding of results and plan of care.

## 2019-06-15 NOTE — Telephone Encounter (Signed)
Pt would like to speak to Dr. Hassan Rowan. Pt did not want to give me any information and asked, for a message be sent to Dr. Hassan Rowan. Thanks

## 2019-06-15 NOTE — Telephone Encounter (Signed)
Dr. Wynona Neat has reviewed the home sleep test this test was negative for sleep apnea.  Dr . Wynona Neat recommendation are:   Sleep position optimization by encouraging sleep in a lateral position, elevating the head of the bed by about   30 degrees may help noted snoring.  Encourage regular exercise and weight loss efforts  Caution against driving when sleepy and against medication with sedative side effects.  Clinical follow up of symptoms.

## 2019-06-16 ENCOUNTER — Encounter: Payer: Self-pay | Admitting: Family Medicine

## 2019-06-17 ENCOUNTER — Encounter: Payer: Self-pay | Admitting: Family Medicine

## 2019-06-17 MED ORDER — MEDROXYPROGESTERONE ACETATE 10 MG PO TABS
ORAL_TABLET | ORAL | 1 refills | Status: DC
Start: 2019-06-17 — End: 2019-10-14

## 2019-06-17 NOTE — Telephone Encounter (Signed)
Spoke with patient by phone. She has sent Dr. Evelene Croon a letter after they told her she might be better served somewhere else. She is upset after I had mentioned borderline personality disorder diagnosis when she was unaware of this.   She still is not feeling comfortable with gyn. Last bx came back normal. She was put on provera.   Has follow up with with ortho in 2 weeks (dr. Ethelene Hal)  She states that she is safe.   I have asked her to give me an update next week/once she hears regarding letter.

## 2019-06-17 NOTE — Telephone Encounter (Signed)
Patient called. She didn't answer. I will check mychart messages to see if she responded to my previous message.

## 2019-06-18 ENCOUNTER — Encounter: Payer: Self-pay | Admitting: Family Medicine

## 2019-06-27 ENCOUNTER — Encounter: Payer: Self-pay | Admitting: Family Medicine

## 2019-06-28 NOTE — Telephone Encounter (Signed)
FYI

## 2019-07-06 ENCOUNTER — Encounter: Payer: Self-pay | Admitting: Family Medicine

## 2019-07-07 ENCOUNTER — Other Ambulatory Visit: Payer: Self-pay

## 2019-07-08 ENCOUNTER — Encounter: Payer: Self-pay | Admitting: Family Medicine

## 2019-07-08 ENCOUNTER — Ambulatory Visit: Payer: BC Managed Care – PPO | Admitting: Family Medicine

## 2019-07-08 ENCOUNTER — Ambulatory Visit (INDEPENDENT_AMBULATORY_CARE_PROVIDER_SITE_OTHER): Payer: BC Managed Care – PPO | Admitting: Family Medicine

## 2019-07-08 VITALS — BP 118/88 | HR 102 | Temp 98.0°F | Ht 66.0 in | Wt 221.2 lb

## 2019-07-08 DIAGNOSIS — I10 Essential (primary) hypertension: Secondary | ICD-10-CM

## 2019-07-08 DIAGNOSIS — M436 Torticollis: Secondary | ICD-10-CM

## 2019-07-08 DIAGNOSIS — N8502 Endometrial intraepithelial neoplasia [EIN]: Secondary | ICD-10-CM

## 2019-07-08 DIAGNOSIS — F339 Major depressive disorder, recurrent, unspecified: Secondary | ICD-10-CM

## 2019-07-08 MED ORDER — LISINOPRIL-HYDROCHLOROTHIAZIDE 20-12.5 MG PO TABS: 1.0000 | ORAL_TABLET | Freq: Every day | ORAL | 1 refills | Status: DC

## 2019-07-08 NOTE — Progress Notes (Signed)
Katie [name] DOB: 1985/08/19 Encounter date: 07/08/2019  This is a 34 y.o. female who presents with Chief Complaint  Patient presents with  . Follow-up    History of present illness: "nothing else to say" "I don't even know why I'm here" Decided not to care about anything.  States that this makes it easier for her to manage everything.  Doing PT for the neck now - has had 2 sessions. Feels bad when she does it.  She has never felt that this is going to help her at all.  She states that Dr. Nelva Bush told her "there is nothing wrong with her neck" so she is not sure why she is even doing anything about this.  On further discussion, she states that he said there is nothing wrong with the spine.  She has never completed therapy before, because she felt like it was a waste of time and would not do any good.  Her neck hurts all the time and on a daily basis.  Not checking blood pressures at home anymore. Stopped a couple of weeks ago. When she would check it was always up - getting numbers in the 130's over "something".   States that when she does not care about anything, then mood does not have to be "bad".  She is decided to ignore her colleagues at work and supervisors that bother her.  When asked, she states that she still has no good support group.  She does not feel that she can rely on her family at all and has no friends that she can count on.  Allergies  Allergen Reactions  . Flexeril [Cyclobenzaprine Hcl] Shortness Of Breath  . Lorazepam     Other reaction(s): Other Neck spasm   Current Meds  Medication Sig  . amphetamine-dextroamphetamine (ADDERALL) 20 MG tablet Take 20 mg by mouth 3 (three) times daily.  . baclofen (LIORESAL) 10 MG tablet Take 10 mg by mouth 2 (two) times daily.   . blood glucose meter kit and supplies KIT Use as directed twice a day  . cetirizine (ZYRTEC) 10 MG tablet Take 10 mg by mouth daily.  . diazepam (VALIUM) 5 MG tablet Take 5 mg by mouth 2  (two) times daily as needed for anxiety.  . Drospirenone (SLYND) 4 MG TABS Take 4 mg by mouth daily.  . DULoxetine (CYMBALTA) 30 MG capsule Take 3 capsules (90 mg total) by mouth daily.  Marland Kitchen EC-NAPROXEN 500 MG EC tablet TAKE ONE TABLET BY MOUTH TWICE A DAY AS NEEDED FOR PAIN (Patient taking differently: Take 500 mg by mouth 2 (two) times daily as needed (pain). )  . Eluxadoline (VIBERZI) 100 MG TABS Take 1 tablet (100 mg total) by mouth 2 (two) times daily.  . hydrOXYzine (VISTARIL) 25 MG capsule Take 25 mg by mouth every 8 (eight) hours as needed for itching.   Marland Kitchen lisinopril-hydrochlorothiazide (PRINZIDE,ZESTORETIC) 20-12.5 MG tablet Take 1 tablet by mouth daily.   . medroxyPROGESTERone (PROVERA) 10 MG tablet 62m daily days 1-10 of the month.  .Marland Kitchenomeprazole (PRILOSEC) 20 MG capsule TAKE ONE CAPSULE BY MOUTH TWICE A DAY BEFORE A MEAL (Patient taking differently: Take 20 mg by mouth 2 (two) times daily before a meal. )  . phentermine 37.5 MG capsule Take 1 capsule (37.5 mg total) by mouth every morning.  . traZODone (DESYREL) 50 MG tablet Take 50-150 mg by mouth at bedtime.  . vitamin B-12 (CYANOCOBALAMIN) 1000 MCG tablet Take 1,000 mcg by mouth daily.  .Marland Kitchen  Vitamin D, Ergocalciferol, (DRISDOL) 1.25 MG (50000 UNIT) CAPS capsule Take 1 capsule (50,000 Units total) by mouth every 7 (seven) days.    Review of Systems  Constitutional: Negative for chills, fatigue and fever.  Respiratory: Negative for cough, chest tightness, shortness of breath and wheezing.   Cardiovascular: Negative for chest pain, palpitations and leg swelling.  Psychiatric/Behavioral: Negative for suicidal ideas.    Objective:  BP 118/90 (BP Location: Left Arm, Patient Position: Sitting, Cuff Size: Large)   Pulse (!) 102   Temp 98 F (36.7 C) (Temporal)   Ht '5\' 6"'  (1.676 m)   Wt 221 lb 3.2 oz (100.3 kg)   BMI 35.70 kg/m   Weight: 221 lb 3.2 oz (100.3 kg)   BP Readings from Last 3 Encounters:  07/08/19 118/90  06/07/19 (!)  140/95  06/06/19 116/82   Wt Readings from Last 3 Encounters:  07/08/19 221 lb 3.2 oz (100.3 kg)  06/07/19 224 lb 3.2 oz (101.7 kg)  06/06/19 224 lb 3.2 oz (101.7 kg)    Physical Exam Constitutional:      General: She is not in acute distress.    Appearance: She is well-developed.  Neck:     Comments: Significant torticollis with pulling to the left.  Spasm entire left side of the neck. Cardiovascular:     Rate and Rhythm: Normal rate and regular rhythm.     Heart sounds: Normal heart sounds. No murmur heard.  No friction rub.  Pulmonary:     Effort: Pulmonary effort is normal. No respiratory distress.     Breath sounds: Normal breath sounds. No wheezing or rales.  Musculoskeletal:     Cervical back: Torticollis present.     Right lower leg: No edema.     Left lower leg: No edema.  Neurological:     Mental Status: She is alert and oriented to person, place, and time.  Psychiatric:        Mood and Affect: Affect is blunt and angry.        Behavior: Behavior is agitated.     Comments: Speech is short and brief. She is really not open to discussion about any of medical issues that are ongoing.  All responses are in different.     Assessment/Plan  1. Essential hypertension Blood pressure is improved today.  She had been taking Adderall as well as phentermine, which I suspect may have slight elevation with her blood pressure.  She is no longer seeing her former psychiatrist and has adamantly expressed she does not wish to see another psychiatrist.  She is off with the Megace, which was the reason for adding the phentermine to help with weight gain.  Since both of these stimulants have been stopped, I suspect blood pressure will improve.  Additionally, she continues to lose weight (which is great) which is also helping with blood pressure.  Rather than adjusting meds further today, I think we should monitor and if still slight diastolic elevation in the future, we can increase at  that time.  2. Torticollis, acquired She is currently working with physical therapy.  She is seeing Ortho.  I tried to encourage her to stick with this program and complete physical therapy.  Try to encourage her that physical therapy can do a lot for muscle imbalance, and is also a stepping stone for any further intervention that is needed to help with the neck.  She is feeling very negatively about this, but I do not feel like I can  encourage her any further from my point.  3. Complex atypical endometrial hyperplasia She is following regularly with gynecology.  Last biopsy had more positive results.  She is uncertain right now about keeping follow-up with any of her specialist, I encouraged her to do so repeatedly in the past.  4.  Recurrent depression Patient had become more agitated after mention of borderline personality disorder diagnosis was relayed to her after my discussion with psychiatry (a couple of months ago).  Patient stated that she had been unaware that this was a diagnosis that she had, and her online reading made her feel that this diagnosis was just calling her "crazy".  We had a few discussions where I encouraged her to follow-up with her psychiatrist, but unfortunately she was unable to do so before being dismissed from that practice.  I have strongly encouraged her to consider meeting with a new psychiatrist, because she does have ongoing depression and anxiety that I feel would be best managed by a specialist.  Additionally, she has been on medication for her ADD.  She was frustrated with her psychiatrist not refilling her Valium after she had been hospitalized secondary to attempted overdose on this medication.  Unfortunately, the Valium was helpful for her from an anxiety as well as muscle spasm standpoint.  I do feel like on these conditions would best be managed by psychiatrist, but historically she has had difficulty with trusting providers and finding providers that she felt  were truly caring about her best interest. I have repeatedly encouraged her to reach out with concerns and asked her to let me know if she feels I am able to assist her. I do not feel comfortable prescribing her psychiatric medications, however, and suggest she see a specialist for this given extensive history.   Return in about 6 months (around 01/07/2020) for physical exam.    Micheline Rough, MD

## 2019-07-10 ENCOUNTER — Encounter: Payer: Self-pay | Admitting: Family Medicine

## 2019-07-11 ENCOUNTER — Encounter: Payer: Self-pay | Admitting: Family Medicine

## 2019-07-11 ENCOUNTER — Other Ambulatory Visit: Payer: Self-pay | Admitting: Family Medicine

## 2019-07-15 ENCOUNTER — Emergency Department (HOSPITAL_COMMUNITY): Payer: BC Managed Care – PPO

## 2019-07-15 ENCOUNTER — Other Ambulatory Visit: Payer: Self-pay

## 2019-07-15 ENCOUNTER — Encounter (HOSPITAL_COMMUNITY): Payer: Self-pay

## 2019-07-15 ENCOUNTER — Emergency Department (HOSPITAL_COMMUNITY)
Admission: EM | Admit: 2019-07-15 | Discharge: 2019-07-15 | Disposition: A | Discharge status: Home / Self Care | Payer: BC Managed Care – PPO | Attending: Emergency Medicine | Admitting: Emergency Medicine

## 2019-07-15 ENCOUNTER — Encounter: Payer: Self-pay | Admitting: Family Medicine

## 2019-07-15 DIAGNOSIS — Z79899 Other long term (current) drug therapy: Secondary | ICD-10-CM | POA: Insufficient documentation

## 2019-07-15 DIAGNOSIS — R079 Chest pain, unspecified: Secondary | ICD-10-CM | POA: Insufficient documentation

## 2019-07-15 DIAGNOSIS — J45909 Unspecified asthma, uncomplicated: Secondary | ICD-10-CM | POA: Diagnosis not present

## 2019-07-15 DIAGNOSIS — Z87891 Personal history of nicotine dependence: Secondary | ICD-10-CM | POA: Diagnosis not present

## 2019-07-15 DIAGNOSIS — R0602 Shortness of breath: Secondary | ICD-10-CM | POA: Diagnosis present

## 2019-07-15 DIAGNOSIS — I10 Essential (primary) hypertension: Secondary | ICD-10-CM | POA: Diagnosis not present

## 2019-07-15 DIAGNOSIS — R000 Tachycardia, unspecified: Secondary | ICD-10-CM | POA: Insufficient documentation

## 2019-07-15 LAB — I-STAT BETA HCG BLOOD, ED (MC, WL, AP ONLY): I-stat hCG, quantitative: <5 m[IU]/mL (ref <5)

## 2019-07-15 LAB — CBC
HCT: 37.1 % (ref 36.0–46.0)
Hemoglobin: 12.2 g/dL (ref 12.0–15.0)
MCH: 30.8 pg (ref 26.0–34.0)
MCHC: 32.9 g/dL (ref 30.0–36.0)
MCV: 93.7 fL (ref 80.0–100.0)
Platelets: 206 10*3/uL (ref 150–400)
RBC: 3.96 MIL/uL (ref 3.87–5.11)
RDW: 12.6 % (ref 11.5–15.5)
WBC: 9.7 10*3/uL (ref 4.0–10.5)
nRBC: 0 % (ref 0.0–0.2)

## 2019-07-15 LAB — BASIC METABOLIC PANEL
Anion gap: 8 (ref 5–15)
BUN: 18 mg/dL (ref 6–20)
CO2: 26 mmol/L (ref 22–32)
Calcium: 8.8 mg/dL — ABNORMAL LOW (ref 8.9–10.3)
Chloride: 104 mmol/L (ref 98–111)
Creatinine, Ser: 0.7 mg/dL (ref 0.44–1.00)
GFR calc Af Amer: >60 mL/min (ref >60)
GFR calc non Af Amer: >60 mL/min (ref >60)
Glucose, Bld: 91 mg/dL (ref 70–99)
Potassium: 3.7 mmol/L (ref 3.5–5.1)
Sodium: 138 mmol/L (ref 135–145)

## 2019-07-15 LAB — D-DIMER, QUANTITATIVE: D-Dimer, Quant: 0.27 ug/mL-FEU (ref 0.00–0.50)

## 2019-07-15 LAB — TROPONIN I (HIGH SENSITIVITY): Troponin I (High Sensitivity): <2 ng/L (ref <18)

## 2019-07-15 MED ORDER — SODIUM CHLORIDE 0.9% FLUSH: 3.0000 mL | Freq: Once | INTRAVENOUS | Status: DC

## 2019-07-15 MED ORDER — METOCLOPRAMIDE HCL 5 MG/ML IJ SOLN
10.0000 mg | Freq: Once | INTRAMUSCULAR | Status: AC
  Administered 2019-07-15: 10 mg via INTRAVENOUS
  Filled 2019-07-15: qty 2

## 2019-07-15 MED ORDER — KETOROLAC TROMETHAMINE 30 MG/ML IJ SOLN
30.0000 mg | Freq: Once | INTRAMUSCULAR | Status: AC
  Administered 2019-07-15: 30 mg via INTRAVENOUS
  Filled 2019-07-15: qty 1

## 2019-07-15 MED ORDER — DIPHENHYDRAMINE HCL 25 MG PO CAPS
50.0000 mg | ORAL_CAPSULE | Freq: Once | ORAL | Status: AC
  Administered 2019-07-15: 50 mg via ORAL
  Filled 2019-07-15: qty 2
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
## 2019-07-15 NOTE — Discharge Instructions (Addendum)
Your chest pain work-up was very reassuring today.  There were no abnormalities found on x-ray, EKG, blood work.  Please continue to monitor your symptoms and return to ED for any new or concerning symptoms.  Please follow-up with your primary care doctor and explained that you would like to be started on an SSRI such as Zoloft.  I believe that you would benefit greatly from this medication.

## 2019-07-15 NOTE — ED Triage Notes (Signed)
patient c/o mid chest pain and SOB that started last night at 2200. Patient states the pain has been constant, but the intensity varies. Patient denies any N/v or diaphoresis.

## 2019-07-16 ENCOUNTER — Encounter: Payer: Self-pay | Admitting: Family Medicine

## 2019-07-19 NOTE — Telephone Encounter (Signed)
*  she was in ER, their eval looked ok overall.  *she should be scheduled for ER follow up, but I know we do not have openings this week. Did headache improve after treatment in ED?  *wonder if she would be willing to do a virtual with [facility] to discuss headache more since eval in ED looked ok? I know she prefers to stick with same provider but just explain that due to me being out sick last week, our availability is not good.

## 2019-07-27 DIAGNOSIS — M5030 Other cervical disc degeneration, unspecified cervical region: Secondary | ICD-10-CM | POA: Insufficient documentation

## 2019-08-01 ENCOUNTER — Encounter: Payer: Self-pay | Admitting: Family Medicine

## 2019-08-01 ENCOUNTER — Other Ambulatory Visit: Payer: Self-pay | Admitting: Family Medicine

## 2019-08-05 ENCOUNTER — Encounter: Payer: Self-pay | Admitting: Family Medicine

## 2019-08-06 ENCOUNTER — Encounter: Payer: Self-pay | Admitting: Family Medicine

## 2019-08-14 ENCOUNTER — Encounter: Payer: Self-pay | Admitting: Family Medicine

## 2019-08-15 ENCOUNTER — Encounter: Payer: Self-pay | Admitting: Family Medicine

## 2019-08-15 NOTE — Telephone Encounter (Signed)
Spoke with the pt and she stated she is having issues with pain, bad cramping and has taken Provera.  I advised the pt it is best that she contact her gynecologist as Dr Hassan Rowan did not prescribe the Provera.  Patient stated she has contacted the gynecology office and they advised she go to the ER.  I advised the pt she may want to follow with their advice.  Message forwarded to PCP.

## 2019-08-16 ENCOUNTER — Encounter: Payer: Self-pay | Admitting: Family Medicine

## 2019-08-16 DIAGNOSIS — N912 Amenorrhea, unspecified: Secondary | ICD-10-CM

## 2019-08-16 DIAGNOSIS — R232 Flushing: Secondary | ICD-10-CM

## 2019-08-16 DIAGNOSIS — Z113 Encounter for screening for infections with a predominantly sexual mode of transmission: Secondary | ICD-10-CM

## 2019-08-16 DIAGNOSIS — E611 Iron deficiency: Secondary | ICD-10-CM

## 2019-08-16 DIAGNOSIS — R102 Pelvic and perineal pain: Secondary | ICD-10-CM

## 2019-08-17 NOTE — Telephone Encounter (Signed)
She has been having some lower abdominal pain.  This started after doing "some stupid things".  She did see Dr. Juliene Pina yesterday, and has a follow-up visit scheduled next week including an ultrasound, but she did not have blood work or STD testing done at that visit.  She has no STD exposures that she is aware of, but would like to have testing.  The abdominal pain is significant enough that it is causing her not to sleep.  Is not there all the time, but is there most of the time.  She does have some associated nausea with it.  Noted discomfort with urination.  She is also having episodes where she feels hot and feels like she might pass out.  No fevers.  LMP was 07-24-19.  She had some spotting last week.  She also is complaining of ongoing hair loss, feels that hair is coming out from all over her head.  She did see another gynecologist and was referred to dermatology for further management.  She has an appointment with me scheduled on August 11, but it was meant to be a discussion of memory issues as well as hair loss issues.  Per patient, therapist thinks that she had a dissociative reaction.  This was very scary for her.  She has not had this in the past.  We will start with ordering some blood work.  It does not sound that symptoms are urgent at this time.  We will do a pregnancy test as well as STD testing.  I have added on some additional labs due to hair loss.  We will determine if she needs to be seen in person for follow-up visit.

## 2019-08-17 NOTE — Telephone Encounter (Signed)
Absolutely we can do this. It would be best to have visit/discussion first since some STD testing is best done with speculum exam (chlamydia/gonorrhea); but she might have had these recently with gyn? If just bloodwork desired (HIV, syphillis, hepatitis) we can order. I can do chlamydia/gonorrhea on urine testing but not as accurate and if having vaginal discharge or symptoms should have evaluation with her gynecologist.

## 2019-08-17 NOTE — Telephone Encounter (Signed)
Spoke with the pt and attempted to schedule a lab appt for tomorrow.  After discussing the screening questions, patient answered yes to having nausea and was advised per PCP that she needs to have a negative COVID test prior to scheduling a lab appt.  Patient declined to go for COVID testing and I advised she call back once completed.

## 2019-08-17 NOTE — Telephone Encounter (Signed)
Noted  

## 2019-08-18 ENCOUNTER — Other Ambulatory Visit: Payer: Self-pay

## 2019-08-18 ENCOUNTER — Encounter: Payer: Self-pay | Admitting: Family Medicine

## 2019-08-18 ENCOUNTER — Other Ambulatory Visit: Payer: BC Managed Care – PPO

## 2019-08-18 ENCOUNTER — Other Ambulatory Visit (HOSPITAL_COMMUNITY)
Admission: RE | Admit: 2019-08-18 | Discharge: 2019-08-18 | Disposition: A | Discharge status: Home / Self Care | Payer: BC Managed Care – PPO | Source: Ambulatory Visit | Attending: Family Medicine | Admitting: Family Medicine

## 2019-08-18 DIAGNOSIS — N912 Amenorrhea, unspecified: Secondary | ICD-10-CM

## 2019-08-18 DIAGNOSIS — E611 Iron deficiency: Secondary | ICD-10-CM

## 2019-08-18 DIAGNOSIS — Z113 Encounter for screening for infections with a predominantly sexual mode of transmission: Secondary | ICD-10-CM

## 2019-08-18 DIAGNOSIS — R102 Pelvic and perineal pain: Secondary | ICD-10-CM

## 2019-08-18 DIAGNOSIS — R232 Flushing: Secondary | ICD-10-CM

## 2019-08-18 NOTE — Addendum Note (Signed)
Addended by: Lerry Liner on: 08/18/2019 04:03 PM   Modules accepted: Orders

## 2019-08-18 NOTE — Addendum Note (Signed)
Addended by: Lerry Liner on: 08/18/2019 04:02 PM   Modules accepted: Orders

## 2019-08-20 ENCOUNTER — Encounter: Payer: Self-pay | Admitting: Family Medicine

## 2019-08-20 LAB — URINALYSIS
Bilirubin Urine: NEGATIVE
Glucose, UA: NEGATIVE
Hgb urine dipstick: NEGATIVE
Ketones, ur: NEGATIVE
Nitrite: NEGATIVE
Protein, ur: NEGATIVE
Specific Gravity, Urine: 1.027 (ref 1.001–1.03)
pH: 6 (ref 5.0–8.0)

## 2019-08-20 LAB — CBC WITH DIFFERENTIAL/PLATELET
Absolute Monocytes: 440 cells/uL (ref 200–950)
Basophils Absolute: 18 cells/uL (ref 0–200)
Basophils Relative: 0.2 %
Eosinophils Absolute: 70 cells/uL (ref 15–500)
Eosinophils Relative: 0.8 %
HCT: 40.6 % (ref 35.0–45.0)
Hemoglobin: 13.6 g/dL (ref 11.7–15.5)
Lymphs Abs: 2570 cells/uL (ref 850–3900)
MCH: 31.1 pg (ref 27.0–33.0)
MCHC: 33.5 g/dL (ref 32.0–36.0)
MCV: 92.9 fL (ref 80.0–100.0)
MPV: 10.3 fL (ref 7.5–12.5)
Monocytes Relative: 5 %
Neutro Abs: 5702 cells/uL (ref 1500–7800)
Neutrophils Relative %: 64.8 %
Platelets: 184 10*3/uL (ref 140–400)
RBC: 4.37 10*6/uL (ref 3.80–5.10)
RDW: 11.9 % (ref 11.0–15.0)
Total Lymphocyte: 29.2 %
WBC: 8.8 10*3/uL (ref 3.8–10.8)

## 2019-08-20 LAB — URINE CULTURE
MICRO NUMBER:: 10792026
Result:: NO GROWTH
SPECIMEN QUALITY:: ADEQUATE

## 2019-08-20 LAB — COMPREHENSIVE METABOLIC PANEL
AG Ratio: 1.9 (calc) (ref 1.0–2.5)
ALT: 17 U/L (ref 6–29)
AST: 11 U/L (ref 10–30)
Albumin: 4.4 g/dL (ref 3.6–5.1)
Alkaline phosphatase (APISO): 41 U/L (ref 31–125)
BUN/Creatinine Ratio: 36 (calc) — ABNORMAL HIGH (ref 6–22)
BUN: 26 mg/dL — ABNORMAL HIGH (ref 7–25)
CO2: 27 mmol/L (ref 20–32)
Calcium: 9.7 mg/dL (ref 8.6–10.2)
Chloride: 103 mmol/L (ref 98–110)
Creat: 0.73 mg/dL (ref 0.50–1.10)
Globulin: 2.3 g/dL (calc) (ref 1.9–3.7)
Glucose, Bld: 86 mg/dL (ref 65–99)
Potassium: 4.3 mmol/L (ref 3.5–5.3)
Sodium: 137 mmol/L (ref 135–146)
Total Bilirubin: 0.4 mg/dL (ref 0.2–1.2)
Total Protein: 6.7 g/dL (ref 6.1–8.1)

## 2019-08-20 LAB — HCG, QUANTITATIVE, PREGNANCY: HCG, Total, QN: <3 m[IU]/mL

## 2019-08-20 LAB — HEPATITIS B SURFACE ANTIGEN: Hepatitis B Surface Ag: NONREACTIVE

## 2019-08-20 LAB — IRON,TIBC AND FERRITIN PANEL
%SAT: 19 % (calc) (ref 16–45)
Ferritin: 48 ng/mL (ref 16–154)
Iron: 59 ug/dL (ref 40–190)
TIBC: 308 mcg/dL (calc) (ref 250–450)

## 2019-08-20 LAB — HIV ANTIBODY (ROUTINE TESTING W REFLEX): HIV 1&2 Ab, 4th Generation: NONREACTIVE

## 2019-08-20 LAB — RPR: RPR Ser Ql: NONREACTIVE

## 2019-08-21 LAB — URINE CYTOLOGY ANCILLARY ONLY
Chlamydia: NEGATIVE
Comment: NEGATIVE
Comment: NORMAL
Neisseria Gonorrhea: NEGATIVE

## 2019-08-24 ENCOUNTER — Telehealth (INDEPENDENT_AMBULATORY_CARE_PROVIDER_SITE_OTHER): Payer: BC Managed Care – PPO | Admitting: Family Medicine

## 2019-08-24 ENCOUNTER — Encounter: Payer: Self-pay | Admitting: Family Medicine

## 2019-08-24 ENCOUNTER — Telehealth: Payer: Self-pay | Admitting: *Deleted

## 2019-08-24 VITALS — Wt 212.0 lb

## 2019-08-24 DIAGNOSIS — N8502 Endometrial intraepithelial neoplasia [EIN]: Secondary | ICD-10-CM | POA: Diagnosis not present

## 2019-08-24 DIAGNOSIS — F339 Major depressive disorder, recurrent, unspecified: Secondary | ICD-10-CM

## 2019-08-24 DIAGNOSIS — M436 Torticollis: Secondary | ICD-10-CM | POA: Diagnosis not present

## 2019-08-24 DIAGNOSIS — F419 Anxiety disorder, unspecified: Secondary | ICD-10-CM

## 2019-08-24 NOTE — Progress Notes (Signed)
Virtual Visit via Video Note  I connected with Katie [name]  on 08/28/19 at  1:30 PM EDT by a video enabled telemedicine application and verified that I am speaking with the correct person using two identifiers.  Location patient: home Location provider:work or home office Persons participating in the virtual visit: patient, provider  I discussed the limitations of evaluation and management by telemedicine and the availability of in person appointments. The patient expressed understanding and agreed to proceed.   Katie [name] DOB: 1985-08-09 Encounter date: 08/24/2019  This is a 34 y.o. female who presents with follow up abdominal pain   History of present illness: Has ultrasound later today with gynecology. Pain is doing better today.  For most part pain is gone. Just occasional twinge. Saw diff doc on Monday (Dr. Pamala Hurry) and was treated with antibiotics, which she started last night.   Still not remembering original reason she went to gynecology office. Feels uncomfortable letting them know this. She is talking through this with her therapist.   Hair loss seems to have improved. She has appointment with derm.   Allergies  Allergen Reactions  . Flexeril [Cyclobenzaprine Hcl] Shortness Of Breath  . Lorazepam     Other reaction(s): Other Neck spasm   Current Meds  Medication Sig  . baclofen (LIORESAL) 10 MG tablet Take 10 mg by mouth 2 (two) times daily.   . blood glucose meter kit and supplies KIT Use as directed twice a day  . cetirizine (ZYRTEC) 10 MG tablet Take 10 mg by mouth daily.  . Drospirenone (SLYND) 4 MG TABS Take 4 mg by mouth daily.  Marland Kitchen EC-NAPROXEN 500 MG EC tablet TAKE ONE TABLET BY MOUTH TWICE A DAY AS NEEDED FOR PAIN (Patient taking differently: Take 500 mg by mouth 2 (two) times daily as needed (pain). )  . Eluxadoline (VIBERZI) 100 MG TABS Take 1 tablet (100 mg total) by mouth 2 (two) times daily.  . hydrOXYzine (VISTARIL) 25 MG  capsule Take 25 mg by mouth in the morning and at bedtime.   Marland Kitchen lisinopril-hydrochlorothiazide (ZESTORETIC) 20-12.5 MG tablet Take 1 tablet by mouth daily.  . medroxyPROGESTERone (PROVERA) 10 MG tablet 8m daily days 1-10 of the month. (Patient taking differently: Take 10 mg by mouth daily. 121mdaily days 1-10 of the month.)  . megestrol (MEGACE) 40 MG tablet Take 40 mg by mouth daily.  . Marland Kitchenmeprazole (PRILOSEC) 20 MG capsule TAKE ONE CAPSULE BY MOUTH TWICE A DAY BEFORE A MEAL (Patient taking differently: Take 20 mg by mouth 2 (two) times daily before a meal. )  . phentermine 37.5 MG capsule Take 1 capsule (37.5 mg total) by mouth every morning.  . traZODone (DESYREL) 50 MG tablet Take 50-150 mg by mouth at bedtime as needed for sleep.   . vitamin B-12 (CYANOCOBALAMIN) 1000 MCG tablet Take 1,000 mcg by mouth daily.  . Vitamin D, Ergocalciferol, (DRISDOL) 1.25 MG (50000 UNIT) CAPS capsule Take 1 capsule (50,000 Units total) by mouth every 7 (seven) days.    Review of Systems  Constitutional: Negative for chills, fatigue and fever.  Respiratory: Negative for cough, chest tightness, shortness of breath and wheezing.   Cardiovascular: Negative for chest pain, palpitations and leg swelling.  Gastrointestinal: Negative for abdominal distention, abdominal pain, constipation and diarrhea.  Genitourinary: Negative for difficulty urinating and dysuria.    Objective:  Wt 212 lb (96.2 kg)   BMI 34.22 kg/m   Weight: 212 lb (96.2 kg)   BP Readings from  Last 3 Encounters:  07/15/19 134/86  07/08/19 118/88  06/07/19 (!) 140/95   Wt Readings from Last 3 Encounters:  08/24/19 212 lb (96.2 kg)  07/15/19 220 lb (99.8 kg)  07/08/19 221 lb 3.2 oz (100.3 kg)    EXAM:  GENERAL: alert, oriented, appears well and in no acute distress  HEENT: atraumatic, conjunctiva clear, no obvious abnormalities on inspection of external nose and ears  NECK: normal movements of the head and neck  LUNGS: on  inspection no signs of respiratory distress, breathing rate appears normal, no obvious gross SOB, gasping or wheezing  CV: no obvious cyanosis  MS: moves all visible extremities without noticeable abnormality  PSYCH/NEURO: pleasant and cooperative, no obvious depression or anxiety, speech and thought processing grossly intact   Assessment/Plan  1. Torticollis, acquired She is following with ortho and in PT for this.  2. Complex atypical endometrial hyperplasia Following with Dr. Benjie Karvonen, has follow up visit today for [place].   Abdominal pain has improved since we last talked.   3. Depression, recurrent ([place]) States that mood has been stable. Doing better than last week.   4. Anxiety See above. Has been stable.   Return in about 6 months (around 02/24/2020) for Chronic condition visit.  hair loss has stabilized; she is seeing dermatology on Friday.   I discussed the assessment and treatment plan with the patient. The patient was provided an opportunity to ask questions and all were answered. The patient agreed with the plan and demonstrated an understanding of the instructions.   The patient was advised to call back or seek an in-person evaluation if the symptoms worsen or if the condition fails to improve as anticipated.  I provided 25 minutes of non-face-to-face time during this encounter.   Micheline Rough, MD

## 2019-08-24 NOTE — Telephone Encounter (Signed)
Labs from 08/15/2019 faxed to Dr Juliene Pina at [phone].

## 2019-08-24 NOTE — Telephone Encounter (Signed)
Noted  

## 2019-08-24 NOTE — Telephone Encounter (Signed)
-----   Message from Wynn Banker, MD sent at 08/24/2019  2:05 PM EDT ----- Can you fax labs to Dr. Camillia Herter office that Katie [name] had last with [place]? Thanks. She has appointment this afternoon with her (at 3:30 and 4:15)

## 2019-08-26 ENCOUNTER — Encounter: Payer: Self-pay | Admitting: Family Medicine

## 2019-08-30 ENCOUNTER — Telehealth: Payer: Self-pay | Admitting: *Deleted

## 2019-08-30 NOTE — Telephone Encounter (Signed)
-----   Message from Wynn Banker, MD sent at 08/28/2019  8:43 PM EDT ----- Please set up 6 mo chronic condition follow up visit. thanks

## 2019-08-30 NOTE — Telephone Encounter (Signed)
Spoke with the pt and informed her of the message below.  Patient stated she has an appt already scheduled in December and prefers to keep this one instead.

## 2019-09-01 NOTE — Telephone Encounter (Signed)
I called patient and reviewed records with her.  We discussed importance of looking out for her own safety.

## 2019-09-02 ENCOUNTER — Telehealth: Payer: Self-pay | Admitting: Family Medicine

## 2019-09-02 NOTE — Telephone Encounter (Signed)
Pt is calling to see if [facility] sent over medical for the date of 07/15/2019. Also wants to speak with Dr. Hassan Rowan about false information in the medical records   Please advise

## 2019-09-05 NOTE — Telephone Encounter (Signed)
Message sent via Mychart message. 
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
## 2019-09-06 ENCOUNTER — Telehealth: Payer: Self-pay | Admitting: *Deleted

## 2019-09-06 NOTE — Telephone Encounter (Signed)
Dr Hassan Rowan received a letter from [facility] requesting a prior auth for Viberzi 100mg  as of 10/14/2019.  Per Dr 12/14/2019 the diagnosis for the medication is IBS.  Prior auth was sent to [email or web address]: Hassan Rowan.

## 2019-09-10 ENCOUNTER — Other Ambulatory Visit: Payer: Self-pay | Admitting: Family Medicine

## 2019-09-24 ENCOUNTER — Encounter: Payer: Self-pay | Admitting: Family Medicine

## 2019-10-01 ENCOUNTER — Encounter: Payer: Self-pay | Admitting: Family Medicine

## 2019-10-03 LAB — OB RESULTS CONSOLE ANTIBODY SCREEN: Antibody Screen: NEGATIVE

## 2019-10-03 LAB — OB RESULTS CONSOLE GC/CHLAMYDIA
Chlamydia: NEGATIVE
Gonorrhea: NEGATIVE

## 2019-10-03 LAB — OB RESULTS CONSOLE ABO/RH: RH Type: POSITIVE

## 2019-10-08 ENCOUNTER — Other Ambulatory Visit: Payer: Self-pay | Admitting: Family Medicine

## 2019-10-08 NOTE — Telephone Encounter (Signed)
Please see if she would like follow up visit? Just wondering how she is doing with mood.   Also please update med list. Not sure why all meds were changed including blood pressure medications?

## 2019-10-11 NOTE — Telephone Encounter (Signed)
Spoke with the pt and scheduled a virtual visit for 10/1 at 11am.

## 2019-10-14 ENCOUNTER — Encounter: Payer: Self-pay | Admitting: Family Medicine

## 2019-10-14 ENCOUNTER — Telehealth (INDEPENDENT_AMBULATORY_CARE_PROVIDER_SITE_OTHER): Payer: BC Managed Care – PPO | Admitting: Family Medicine

## 2019-10-14 DIAGNOSIS — Z3A01 Less than 8 weeks gestation of pregnancy: Secondary | ICD-10-CM

## 2019-10-14 DIAGNOSIS — F419 Anxiety disorder, unspecified: Secondary | ICD-10-CM

## 2019-10-14 DIAGNOSIS — M436 Torticollis: Secondary | ICD-10-CM

## 2019-10-14 DIAGNOSIS — F339 Major depressive disorder, recurrent, unspecified: Secondary | ICD-10-CM | POA: Diagnosis not present

## 2019-10-14 NOTE — Progress Notes (Signed)
Virtual Visit via Video Note  I connected with Katie [name]  on 10/14/19 at 11:00 AM EDT by a video enabled telemedicine application and verified that I am speaking with the correct person using two identifiers.  Location patient: home Location provider: [place], [place] [address] Persons participating in the virtual visit: patient, provider  I discussed the limitations of evaluation and management by telemedicine and the availability of in person appointments. The patient expressed understanding and agreed to proceed.   Katie [name] DOB: May 12, 1985 Encounter date: 10/14/2019  This is a 34 y.o. female who presents with Chief Complaint  Patient presents with   Follow-up    History of present illness: Very tired. Working 80-125 hour weeks and doing double shifts. Found out at last obgyn appt that she was pregnant. She is [redacted] weeks pregnant. Was supposed to have endometrial bx and wasn't feeling well - breast tenderness, nausea, late menses.  She was surprised by this as it was not something she had planned.  She does not feel like she has a good support system and has not informed any family or friends that this diagnosis.  She does have a follow-up visit for a week ultrasound coming up.  Has had some nausea.   bp was 138/88 at doc office. She hasn't been checking at home lately - 299 systolic at home. Labetalol 151m BID.   Not sleeping well. Some trouble falling; waking up frequently and not feeling like sleep is good quality.  Her neck is really been bothering her.  Since she is not been able to take muscle relaxers, she feels that her neck pain is gotten significantly worse.  She was supposed to have a visit for Botox injections prior to receiving diagnosis of pregnancy, but she ended up having to cancel this visit due to work and now she is no longer able to get these injections.  She does have a follow-up with Ortho  next week.   Allergies  Allergen Reactions   Flexeril [Cyclobenzaprine Hcl] Shortness Of Breath   Lorazepam     Other reaction(s): Other Neck spasm   Current Meds  Medication Sig   blood glucose meter kit and supplies KIT Use as directed twice a day   cetirizine (ZYRTEC) 10 MG tablet Take 10 mg by mouth daily.   labetalol (NORMODYNE) 100 MG tablet Take 100 mg by mouth 2 (two) times daily.   omeprazole (PRILOSEC) 20 MG capsule TAKE ONE CAPSULE BY MOUTH TWICE A DAY BEFORE A MEAL   [DISCONTINUED] baclofen (LIORESAL) 10 MG tablet Take 10 mg by mouth 2 (two) times daily.    [DISCONTINUED] Drospirenone (SLYND) 4 MG TABS Take 4 mg by mouth daily.   [DISCONTINUED] EC-NAPROXEN 500 MG EC tablet TAKE ONE TABLET BY MOUTH TWICE A DAY AS NEEDED FOR PAIN (Patient taking differently: Take 500 mg by mouth 2 (two) times daily as needed (pain). )   [DISCONTINUED] Eluxadoline (VIBERZI) 100 MG TABS Take 1 tablet (100 mg total) by mouth 2 (two) times daily.   [DISCONTINUED] hydrOXYzine (VISTARIL) 25 MG capsule Take 25 mg by mouth in the morning and at bedtime.    [DISCONTINUED] lisinopril-hydrochlorothiazide (ZESTORETIC) 20-12.5 MG tablet Take 1 tablet by mouth daily.   [DISCONTINUED] medroxyPROGESTERone (PROVERA) 10 MG tablet 127mdaily days 1-10 of the month. (Patient taking differently: Take 10 mg by mouth daily. 1048maily days 1-10 of the month.)   [DISCONTINUED] megestrol (MEGACE) 40 MG tablet  Take 40 mg by mouth daily.   [DISCONTINUED] phentermine 37.5 MG capsule Take 1 capsule (37.5 mg total) by mouth every morning.   [DISCONTINUED] traZODone (DESYREL) 50 MG tablet Take 50-150 mg by mouth at bedtime as needed for sleep.    [DISCONTINUED] vitamin B-12 (CYANOCOBALAMIN) 1000 MCG tablet Take 1,000 mcg by mouth daily.   [DISCONTINUED] Vitamin D, Ergocalciferol, (DRISDOL) 1.25 MG (50000 UNIT) CAPS capsule Take 1 capsule (50,000 Units total) by mouth every 7 (seven) days.    Review of  Systems  Constitutional: Negative for chills, fatigue and fever.  Respiratory: Negative for cough, chest tightness, shortness of breath and wheezing.   Cardiovascular: Negative for chest pain, palpitations and leg swelling.    Objective:  LMP 08/30/2019 (Exact Date)       BP Readings from Last 3 Encounters:  07/15/19 134/86  07/08/19 118/88  06/07/19 (!) 140/95   Wt Readings from Last 3 Encounters:  08/24/19 212 lb (96.2 kg)  07/15/19 220 lb (99.8 kg)  07/08/19 221 lb 3.2 oz (100.3 kg)    EXAM:  GENERAL: alert, oriented, appears well and in no acute distress  HEENT: atraumatic, conjunctiva clear, no obvious abnormalities on inspection of external nose and ears  NECK: normal movements of the head and neck  LUNGS: on inspection no signs of respiratory distress, breathing rate appears normal, no obvious gross SOB, gasping or wheezing  CV: no obvious cyanosis  MS: moves all visible extremities without noticeable abnormality  PSYCH/NEURO: pleasant and cooperative, no obvious depression or anxiety, speech and thought processing grossly intact  Assessment/Plan  1. Less than [redacted] weeks gestation of pregnancy She is currently following up with gynecology regularly.  She has ultrasound coming up.  I discussed with her my concerns with her not having good support system through this process.  She does not wish to tell anybody about the pregnancy currently, but has let her therapist know and discussed it with her.  I offered to get her information or help her get in with a support system (i.e. pregnancy support) but she is not interested.  I strongly encouraged her to let me know if she feels that she is having any worsening of mood or any concerns regarding mood with pregnancy.  She has declined referral to psychiatry, but we did discuss that it would be an option to restart an SSRI to help with anxiety and depression if needed during pregnancy.  2. Anxiety See above.  Patient currently  working many hours and very tired.  3. Depression, recurrent ([place]) See above.  No thoughts of self-harm.  Mood has been relatively stable even off medications.  4. Torticollis, acquired I encouraged her to talk about nonmedication options with Ortho.  I do worry that this could get worse for her without ability to take medications or have intervention.  On the other hand, perhaps laxity that happens during pregnancy may be helpful for her.  She may do well with massage or physical therapy during pregnancy, but Ortho should be able to check her in this regard.     I discussed the assessment and treatment plan with the patient. The patient was provided an opportunity to ask questions and all were answered. The patient agreed with the plan and demonstrated an understanding of the instructions.   The patient was advised to call back or seek an in-person evaluation if the symptoms worsen or if the condition fails to improve as anticipated.  I provided 30 minutes of non-face-to-face time during  this encounter.   Micheline Rough, MD

## 2019-10-24 ENCOUNTER — Encounter: Payer: Self-pay | Admitting: Family Medicine

## 2019-11-04 ENCOUNTER — Encounter: Payer: Self-pay | Admitting: Family Medicine

## 2019-11-05 ENCOUNTER — Other Ambulatory Visit: Payer: Self-pay | Admitting: Family Medicine

## 2019-11-05 DIAGNOSIS — E118 Type 2 diabetes mellitus with unspecified complications: Secondary | ICD-10-CM

## 2019-11-07 MED ORDER — BLOOD GLUCOSE MONITOR KIT: PACK | 0 refills | Status: DC

## 2019-11-08 ENCOUNTER — Telehealth: Payer: Self-pay | Admitting: *Deleted

## 2019-11-08 NOTE — Telephone Encounter (Signed)
-----   Message from Wynn Banker, MD sent at 11/02/2019  4:27 PM EDT ----- Can you set up monthly visits for her (if she wants) for bp regulation. She has vist in December, but I think she wants sooner.

## 2019-11-08 NOTE — Telephone Encounter (Signed)
Spoke with the pt and scheduled an appt on 11/19 to arrive at 10:45am.

## 2019-11-28 ENCOUNTER — Encounter: Payer: Self-pay | Admitting: Family Medicine

## 2019-11-28 DIAGNOSIS — E118 Type 2 diabetes mellitus with unspecified complications: Secondary | ICD-10-CM

## 2019-11-29 ENCOUNTER — Encounter: Payer: Self-pay | Admitting: Family Medicine

## 2019-11-29 MED ORDER — BLOOD GLUCOSE MONITOR KIT: PACK | 0 refills | Status: AC

## 2019-12-01 ENCOUNTER — Telehealth: Payer: Self-pay

## 2019-12-01 NOTE — Telephone Encounter (Signed)
Patient called in stating that she went to the pharmacy to pick up her test strips and lancets and was giving the wrong kind for both. States she tried to return them to the pharmacy and they wouldn't allow it  Patient is needing a new Rx sent in for her kind of meter   Contour Next One meter  Needing test strips and lancets for this meter   They gave her AccuCheck strips and lancets and her insurance will not pay for another set    Please call and advise

## 2019-12-02 ENCOUNTER — Ambulatory Visit: Payer: BC Managed Care – PPO | Admitting: Family Medicine

## 2019-12-02 MED ORDER — CONTOUR NEXT ONE KIT: PACK | 0 refills | Status: AC

## 2019-12-02 MED ORDER — CONTOUR NEXT TEST VI STRP: ORAL_STRIP | 3 refills | Status: DC

## 2019-12-02 MED ORDER — LANCETS MISC: 0 refills | Status: DC

## 2019-12-02 NOTE — Telephone Encounter (Signed)
Spoke with the pt and informed her the requested Rx was sent as below.

## 2019-12-16 ENCOUNTER — Encounter: Payer: BC Managed Care – PPO | Admitting: Family Medicine

## 2020-01-03 ENCOUNTER — Other Ambulatory Visit: Payer: Self-pay | Admitting: Family Medicine

## 2020-01-08 ENCOUNTER — Other Ambulatory Visit: Payer: Self-pay | Admitting: Family Medicine

## 2020-01-10 ENCOUNTER — Other Ambulatory Visit: Payer: Self-pay | Admitting: Obstetrics and Gynecology

## 2020-01-10 DIAGNOSIS — Z363 Encounter for antenatal screening for malformations: Secondary | ICD-10-CM

## 2020-01-11 ENCOUNTER — Telehealth: Payer: Self-pay | Admitting: Obstetrics and Gynecology

## 2020-01-11 NOTE — Telephone Encounter (Signed)
PATIENT CALLED IN TO CANCEL HER APPOINTMENT SHE SAID THAT SHE HAD THE ULTRASOUND ALREADY. PROBABLY WILL GET A DIFFERENT REFERRAL

## 2020-01-31 ENCOUNTER — Ambulatory Visit: Payer: BC Managed Care – PPO

## 2020-02-29 ENCOUNTER — Emergency Department (HOSPITAL_COMMUNITY)
Admission: EM | Admit: 2020-02-29 | Discharge: 2020-02-29 | Disposition: A | Discharge status: Home / Self Care | Payer: BC Managed Care – PPO | Attending: Emergency Medicine | Admitting: Emergency Medicine

## 2020-02-29 DIAGNOSIS — O99412 Diseases of the circulatory system complicating pregnancy, second trimester: Secondary | ICD-10-CM | POA: Diagnosis not present

## 2020-02-29 DIAGNOSIS — Z87891 Personal history of nicotine dependence: Secondary | ICD-10-CM | POA: Insufficient documentation

## 2020-02-29 DIAGNOSIS — I10 Essential (primary) hypertension: Secondary | ICD-10-CM | POA: Diagnosis not present

## 2020-02-29 DIAGNOSIS — I471 Supraventricular tachycardia, unspecified: Secondary | ICD-10-CM

## 2020-02-29 DIAGNOSIS — Z3A26 26 weeks gestation of pregnancy: Secondary | ICD-10-CM

## 2020-02-29 DIAGNOSIS — J45909 Unspecified asthma, uncomplicated: Secondary | ICD-10-CM | POA: Diagnosis not present

## 2020-02-29 LAB — URINALYSIS, ROUTINE W REFLEX MICROSCOPIC
Bilirubin Urine: NEGATIVE
Glucose, UA: NEGATIVE mg/dL
Hgb urine dipstick: NEGATIVE
Ketones, ur: NEGATIVE mg/dL
Nitrite: NEGATIVE
Protein, ur: NEGATIVE mg/dL
Specific Gravity, Urine: 1.003 — ABNORMAL LOW (ref 1.005–1.030)
pH: 7 (ref 5.0–8.0)

## 2020-02-29 LAB — CBC WITH DIFFERENTIAL/PLATELET
Abs Immature Granulocytes: 0.04 10*3/uL (ref 0.00–0.07)
Basophils Absolute: 0 10*3/uL (ref 0.0–0.1)
Basophils Relative: 0 %
Eosinophils Absolute: 0.1 10*3/uL (ref 0.0–0.5)
Eosinophils Relative: 2 %
HCT: 32.4 % — ABNORMAL LOW (ref 36.0–46.0)
Hemoglobin: 11.1 g/dL — ABNORMAL LOW (ref 12.0–15.0)
Immature Granulocytes: 1 %
Lymphocytes Relative: 16 %
Lymphs Abs: 1.2 10*3/uL (ref 0.7–4.0)
MCH: 32.7 pg (ref 26.0–34.0)
MCHC: 34.3 g/dL (ref 30.0–36.0)
MCV: 95.6 fL (ref 80.0–100.0)
Monocytes Absolute: 0.5 10*3/uL (ref 0.1–1.0)
Monocytes Relative: 6 %
Neutro Abs: 5.5 10*3/uL (ref 1.7–7.7)
Neutrophils Relative %: 75 %
Platelets: 178 10*3/uL (ref 150–400)
RBC: 3.39 MIL/uL — ABNORMAL LOW (ref 3.87–5.11)
RDW: 13.2 % (ref 11.5–15.5)
WBC: 7.3 10*3/uL (ref 4.0–10.5)
nRBC: 0 % (ref 0.0–0.2)

## 2020-02-29 LAB — COMPREHENSIVE METABOLIC PANEL
ALT: 15 U/L (ref 0–44)
AST: 14 U/L — ABNORMAL LOW (ref 15–41)
Albumin: 2.8 g/dL — ABNORMAL LOW (ref 3.5–5.0)
Alkaline Phosphatase: 30 U/L — ABNORMAL LOW (ref 38–126)
Anion gap: 9 (ref 5–15)
BUN: 5 mg/dL — ABNORMAL LOW (ref 6–20)
CO2: 20 mmol/L — ABNORMAL LOW (ref 22–32)
Calcium: 8.7 mg/dL — ABNORMAL LOW (ref 8.9–10.3)
Chloride: 109 mmol/L (ref 98–111)
Creatinine, Ser: 0.49 mg/dL (ref 0.44–1.00)
GFR, Estimated: >60 mL/min (ref >60)
Glucose, Bld: 109 mg/dL — ABNORMAL HIGH (ref 70–99)
Potassium: 4.1 mmol/L (ref 3.5–5.1)
Sodium: 138 mmol/L (ref 135–145)
Total Bilirubin: 0.6 mg/dL (ref 0.3–1.2)
Total Protein: 5.6 g/dL — ABNORMAL LOW (ref 6.5–8.1)

## 2020-02-29 MED ORDER — LABETALOL HCL 200 MG PO TABS
200.0000 mg | ORAL_TABLET | Freq: Once | ORAL | Status: AC
  Administered 2020-02-29: 200 mg via ORAL
  Filled 2020-02-29: qty 1

## 2020-02-29 MED ORDER — ALUM & MAG HYDROXIDE-SIMETH 200-200-20 MG/5ML PO SUSP
15.0000 mL | Freq: Once | ORAL | Status: AC
  Administered 2020-02-29: 15 mL via ORAL
  Filled 2020-02-29: qty 30

## 2020-02-29 NOTE — ED Triage Notes (Signed)
Pt arrives to ED via [facility] from work with a chief compliant of sudden feeling fo hearting racing and lightheaded ness, pt is [redacted] weeks gestation and has never experienced this before. Per ems pt was in SVT rate of 180 and after unsuccessful vagal maneuvers  administered 6mg  adenosine. She arrives to ER feeling weak. Pt denies any abd pain no vaginal bleeding or leaking of fluid. Pt states baby is kicking a lot.  Will call [facility] and inform charge of need for a room for further monitoring,

## 2020-02-29 NOTE — Progress Notes (Signed)
OB cleared.  Has PNV on 3/9.  Call Dr Mody's office to be seen early next week.
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
## 2020-02-29 NOTE — Progress Notes (Addendum)
G2P1 at 9 1/7 weeks reports to [facility] with c/o weakness and heart racing.  Was treated with adenosine on EMS x1 dose for HR 183.  Currently, 115-121 in ED.  Receiving [facility] with Dr Hilary Hertz.  Current problems are Type 2 DM, diet controlled.  HTN on labatelol 200mg  TID.  Has not taken BP meds since yesterday at lunch.  Current BP 155/87. Currently feeling better after medication administration on EMS. [facility] MD at bedside for assessment. Monitors applied.  Feeling infant move well.  No cxns, bleeding or leaking.  FHT's 150's.

## 2020-03-01 ENCOUNTER — Encounter: Payer: Self-pay | Admitting: Family Medicine

## 2020-03-26 ENCOUNTER — Encounter: Payer: Self-pay | Admitting: Family Medicine

## 2020-03-30 ENCOUNTER — Other Ambulatory Visit: Payer: Self-pay

## 2020-03-30 DIAGNOSIS — T8859XA Other complications of anesthesia, initial encounter: Secondary | ICD-10-CM | POA: Insufficient documentation

## 2020-03-30 DIAGNOSIS — M1990 Unspecified osteoarthritis, unspecified site: Secondary | ICD-10-CM | POA: Insufficient documentation

## 2020-03-30 DIAGNOSIS — F32A Depression, unspecified: Secondary | ICD-10-CM | POA: Insufficient documentation

## 2020-03-30 DIAGNOSIS — J45909 Unspecified asthma, uncomplicated: Secondary | ICD-10-CM | POA: Insufficient documentation

## 2020-03-30 DIAGNOSIS — F410 Panic disorder [episodic paroxysmal anxiety] without agoraphobia: Secondary | ICD-10-CM | POA: Insufficient documentation

## 2020-03-30 DIAGNOSIS — K760 Fatty (change of) liver, not elsewhere classified: Secondary | ICD-10-CM | POA: Insufficient documentation

## 2020-03-30 DIAGNOSIS — R7303 Prediabetes: Secondary | ICD-10-CM | POA: Insufficient documentation

## 2020-04-02 ENCOUNTER — Ambulatory Visit (INDEPENDENT_AMBULATORY_CARE_PROVIDER_SITE_OTHER): Payer: BC Managed Care – PPO | Admitting: Cardiology

## 2020-04-02 ENCOUNTER — Other Ambulatory Visit: Payer: Self-pay

## 2020-04-02 ENCOUNTER — Ambulatory Visit (INDEPENDENT_AMBULATORY_CARE_PROVIDER_SITE_OTHER): Payer: BC Managed Care – PPO

## 2020-04-02 ENCOUNTER — Encounter: Payer: Self-pay | Admitting: Cardiology

## 2020-04-02 VITALS — BP 130/78 | HR 94 | Ht 66.0 in | Wt 266.0 lb

## 2020-04-02 DIAGNOSIS — O163 Unspecified maternal hypertension, third trimester: Secondary | ICD-10-CM

## 2020-04-02 DIAGNOSIS — R0602 Shortness of breath: Secondary | ICD-10-CM

## 2020-04-02 DIAGNOSIS — I471 Supraventricular tachycardia, unspecified: Secondary | ICD-10-CM

## 2020-04-02 DIAGNOSIS — E119 Type 2 diabetes mellitus without complications: Secondary | ICD-10-CM | POA: Insufficient documentation

## 2020-04-02 DIAGNOSIS — O09523 Supervision of elderly multigravida, third trimester: Secondary | ICD-10-CM

## 2020-04-02 HISTORY — DX: Unspecified maternal hypertension, third trimester: O16.3

## 2020-04-02 NOTE — Progress Notes (Signed)
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 replace advice given to  you by your health care provider. Make sure you discuss any questions you have with your health care provider. Document Revised: 08/23/2019 Document Reviewed: 08/23/2019 Elsevier Patient Education  2021 Elsevier Inc.      Adopting a Healthy Lifestyle.  Know what a healthy weight is for you (roughly BMI <25) and aim to maintain this   Aim for 7+ servings of fruits and vegetables daily   65-80+ fluid ounces of water or unsweet tea for healthy kidneys   Limit to max 1 drink of alcohol per day; avoid smoking/tobacco   Limit animal fats in diet for cholesterol and heart health - choose grass fed whenever available   Avoid highly processed foods, and foods high in saturated/trans fats   Aim for low stress - take time to unwind and care for your mental health   Aim for 150 min of moderate intensity exercise weekly for heart health, and weights twice weekly for  bone health   Aim for 7-9 hours of sleep daily   When it comes to diets, agreement about the perfect plan isnt easy to find, even among the experts. Experts at the [facility] [employer] developed an idea known as the Healthy Eating Plate. Just imagine a plate divided into logical, healthy portions.   The emphasis is on diet quality:   Load up on vegetables and fruits - one-half of your plate: Aim for color and variety, and remember that potatoes dont count.   Go for whole grains - one-quarter of your plate: Whole wheat, barley, wheat berries, quinoa, oats, brown rice, and foods made with them. If you want pasta, go with whole wheat pasta.   Protein power - one-quarter of your plate: Fish, chicken, beans, and nuts are all healthy, versatile protein sources. Limit red meat.   The diet, however, does go beyond the plate, offering a few other suggestions.   Use healthy plant oils, such as olive, canola, soy, corn, sunflower and peanut. Check the labels, and avoid partially hydrogenated oil, which have unhealthy trans fats.   If youre thirsty, drink water. Coffee and tea are good in moderation, but skip sugary drinks and limit milk and dairy products to one or two daily servings.   The type of carbohydrate in the diet is more important than the amount. Some sources of carbohydrates, such as vegetables, fruits, whole grains, and beans-are healthier than others.   Finally, stay active  Signed, Thomasene Ripple, DO  04/02/2020 5:16 PM    [facility]

## 2020-04-02 NOTE — Patient Instructions (Signed)
Medication Instructions:  No medication changes.  *If you need a refill on your cardiac medications before your next appointment, please call your pharmacy*   Lab Work: Your physician recommends that you have labs done in the office today. Your test included  basic metabolic panel, complete blood count and magnesium.  If you have labs (blood work) drawn today and your tests are completely normal, you will receive your results only by: Marland Kitchen MyChart Message (if you have MyChart) OR . A paper copy in the mail If you have any lab test that is abnormal or we need to change your treatment, we will call you to review the results.   Testing/Procedures: Your physician has requested that you have an echocardiogram. Echocardiography is a painless test that uses sound waves to create images of your heart. It provides your doctor with information about the size and shape of your heart and how well your heart's chambers and valves are working. This procedure takes approximately one hour. There are no restrictions for this procedure.   WHY IS MY DOCTOR PRESCRIBING ZIO? The Zio system is proven and trusted by physicians to detect and diagnose irregular heart rhythms -- and has been prescribed to hundreds of thousands of patients.  The FDA has cleared the Zio system to monitor for many different kinds of irregular heart rhythms. In a study, physicians were able to reach a diagnosis 90% of the time with the Zio system1.  You can wear the Zio monitor -- a small, discreet, comfortable patch -- during your normal day-to-day activity, including while you sleep, shower, and exercise, while it records every single heartbeat for analysis.  1Barrett, P., et al. Comparison of 24 Hour Holter Monitoring Versus 14 Day Novel Adhesive Patch Electrocardiographic Monitoring. American Journal of Medicine, 2014.  ZIO VS. HOLTER MONITORING The Zio monitor can be comfortably worn for up to 14 days. Holter monitors can be worn  for 24 to 48 hours, limiting the time to record any irregular heart rhythms you may have. Zio is able to capture data for the 51% of patients who have their first symptom-triggered arrhythmia after 48 hours.1  LIVE WITHOUT RESTRICTIONS The Zio ambulatory cardiac monitor is a small, unobtrusive, and water-resistant patch--you might even forget you're wearing it. The Zio monitor records and stores every beat of your heart, whether you're sleeping, working out, or showering. Wear the monitor for 7 days, remove on 04/09/20.   Follow-Up: At [facility], you and your health needs are our priority.  As part of our continuing mission to provide you with exceptional heart care, we have created designated Provider Care Teams.  These Care Teams include your primary Cardiologist (physician) and Advanced Practice Providers (APPs -  Physician Assistants and Nurse Practitioners) who all work together to provide you with the care you need, when you need it.  We recommend signing up for the patient portal called "MyChart".  Sign up information is provided on this After Visit Summary.  MyChart is used to connect with patients for Virtual Visits (Telemedicine).  Patients are able to view lab/test results, encounter notes, upcoming appointments, etc.  Non-urgent messages can be sent to your provider as well.   To learn more about what you can do with MyChart, go to [email or web address].    Your next appointment:   4 week(s)  The format for your next appointment:   In Person  Provider:   Thomasene Ripple, DO   Other Instructions Propranolol Extended-Release Capsules What is this medicine? PROPRANOLOL (  proe PRAN oh lole) is a beta blocker. It decreases the amount of work your heart has to do and helps your heart beat regularly. It treats high blood pressure and/or prevent chest pain (also called angina). This medicine may be used for other purposes; ask your health care provider or pharmacist if you have  questions. COMMON BRAND NAME(S): Inderal LA, Inderal XL, InnoPran XL What should I tell my health care provider before I take this medicine? They need to know if you have any of these conditions:  circulation problems, or blood vessel disease  diabetes  history of heart attack or heart disease, vasospastic angina  kidney disease  liver disease  lung or breathing disease, like asthma or emphysema  pheochromocytoma  slow heart rate  thyroid disease  an unusual or allergic reaction to propranolol, other beta-blockers, medicines, foods, dyes, or preservatives  pregnant or trying to get pregnant  breast-feeding How should I use this medicine? Take this drug by mouth. Take it as directed on the prescription label at the same time every day. Do not cut, crush or chew this drug. Swallow the capsules whole. You can take it with or without food. If it upsets your stomach, take it with food. Keep taking it unless your health care provider tells you to stop. Talk to your health care provider about the use of this drug in children. Special care may be needed. Overdosage: If you think you have taken too much of this medicine contact a poison control center or emergency room at once. NOTE: This medicine is only for you. Do not share this medicine with others. What if I miss a dose? If you miss a dose, take it as soon as you can. If it is almost time for your next dose, take only that dose. Do not take double or extra doses. What may interact with this medicine? Do not take this medicine with any of the following medications:  feverfew  phenothiazines like chlorpromazine, mesoridazine, prochlorperazine, thioridazine This medicine may also interact with the following medications:  aluminum hydroxide gel  antipyrine  antiviral medicines for HIV or AIDS  barbiturates like phenobarbital  certain medicines for blood pressure, heart disease, irregular heart  beat  cimetidine  ciprofloxacin  diazepam  fluconazole  haloperidol  isoniazid  medicines for cholesterol like cholestyramine or colestipol  medicines for mental depression  medicines for migraine headache like almotriptan, eletriptan, frovatriptan, naratriptan, rizatriptan, sumatriptan, zolmitriptan  NSAIDs, medicines for pain and inflammation, like ibuprofen or naproxen  phenytoin  rifampin  teniposide  theophylline  thyroid medicines  tolbutamide  warfarin  zileuton This list may not describe all possible interactions. Give your health care provider a list of all the medicines, herbs, non-prescription drugs, or dietary supplements you use. Also tell them if you smoke, drink alcohol, or use illegal drugs. Some items may interact with your medicine. What should I watch for while using this medicine? Visit your doctor or health care professional for regular check ups. Contact your doctor right away if your symptoms worsen. Check your blood pressure and pulse rate regularly. Ask your health care professional what your blood pressure and pulse rate should be, and when you should contact them. Do not stop taking this medicine suddenly. This could lead to serious heart-related effects. You may get drowsy or dizzy. Do not drive, use machinery, or do anything that needs mental alertness until you know how this drug affects you. Do not stand or sit up quickly, especially if you are  an older patient. This reduces the risk of dizzy or fainting spells. Alcohol can make you more drowsy and dizzy. Avoid alcoholic drinks. This medicine may increase blood sugar. Ask your healthcare provider if changes in diet or medicines are needed if you have diabetes. Do not treat yourself for coughs, colds, or pain while you are taking this medicine without asking your doctor or health care professional for advice. Some ingredients may increase your blood pressure. What side effects may I notice  from receiving this medicine? Side effects that you should report to your doctor or health care professional as soon as possible:  allergic reactions like skin rash, itching or hives, swelling of the face, lips, or tongue  breathing problems  cold hands or feet  difficulty sleeping, nightmares  dry peeling skin  hallucinations  muscle cramps or weakness  signs and symptoms of high blood sugar such as being more thirsty or hungry or having to urinate more than normal. You may also feel very tired or have blurry vision.  slow heart rate  swelling of the legs and ankles  vomiting Side effects that usually do not require medical attention (report to your doctor or health care professional if they continue or are bothersome):  change in sex drive or performance  diarrhea  dry sore eyes  hair loss  nausea  weak or tired This list may not describe all possible side effects. Call your doctor for medical advice about side effects. You may report side effects to FDA at 1-800-FDA-1088. Where should I keep my medicine? Keep out of the reach of children and pets. Store at room temperature between 15 and 30 degrees C (59 and 86 degrees F). Protect from light and moisture. Keep the container tightly closed. Avoid exposure to extreme heat. Do not freeze. Throw away any unused drug after the expiration date. NOTE: This sheet is a summary. It may not cover all possible information. If you have questions about this medicine, talk to your doctor, pharmacist, or health care provider.  2021 Elsevier/Gold Standard (2018-08-09 16:23:26)  Echocardiogram An echocardiogram is a test that uses sound waves (ultrasound) to produce images of the heart. Images from an echocardiogram can provide important information about:  Heart size and shape.  The size and thickness and movement of your heart's walls.  Heart muscle function and strength.  Heart valve function or if you have stenosis.  Stenosis is when the heart valves are too narrow.  If blood is flowing backward through the heart valves (regurgitation).  A tumor or infectious growth around the heart valves.  Areas of heart muscle that are not working well because of poor blood flow or injury from a heart attack.  Aneurysm detection. An aneurysm is a weak or damaged part of an artery wall. The wall bulges out from the normal force of blood pumping through the body. Tell a health care provider about:  Any allergies you have.  All medicines you are taking, including vitamins, herbs, eye drops, creams, and over-the-counter medicines.  Any blood disorders you have.  Any surgeries you have had.  Any medical conditions you have.  Whether you are pregnant or may be pregnant. What are the risks? Generally, this is a safe test. However, problems may occur, including an allergic reaction to dye (contrast) that may be used during the test. What happens before the test? No specific preparation is needed. You may eat and drink normally. What happens during the test?  You will take off your  clothes from the waist up and put on a hospital gown.  Electrodes or electrocardiogram (ECG)patches may be placed on your chest. The electrodes or patches are then connected to a device that monitors your heart rate and rhythm.  You will lie down on a table for an ultrasound exam. A gel will be applied to your chest to help sound waves pass through your skin.  A handheld device, called a transducer, will be pressed against your chest and moved over your heart. The transducer produces sound waves that travel to your heart and bounce back (or "echo" back) to the transducer. These sound waves will be captured in real-time and changed into images of your heart that can be viewed on a video monitor. The images will be recorded on a computer and reviewed by your health care provider.  You may be asked to change positions or hold your breath for  a short time. This makes it easier to get different views or better views of your heart.  In some cases, you may receive contrast through an IV in one of your veins. This can improve the quality of the pictures from your heart. The procedure may vary among health care providers and hospitals.   What can I expect after the test? You may return to your normal, everyday life, including diet, activities, and medicines, unless your health care provider tells you not to do that. Follow these instructions at home:  It is up to you to get the results of your test. Ask your health care provider, or the department that is doing the test, when your results will be ready.  Keep all follow-up visits. This is important. Summary  An echocardiogram is a test that uses sound waves (ultrasound) to produce images of the heart.  Images from an echocardiogram can provide important information about the size and shape of your heart, heart muscle function, heart valve function, and other possible heart problems.  You do not need to do anything to prepare before this test. You may eat and drink normally.  After the echocardiogram is completed, you may return to your normal, everyday life, unless your health care provider tells you not to do that. This information is not intended to replace advice given to you by your health care provider. Make sure you discuss any questions you have with your health care provider. Document Revised: 08/23/2019 Document Reviewed: 08/23/2019 Elsevier Patient Education  2021 [employer].

## 2020-04-03 ENCOUNTER — Telehealth: Payer: Self-pay

## 2020-04-03 LAB — CBC WITH DIFFERENTIAL/PLATELET
Basophils Absolute: 0 10*3/uL (ref 0.0–0.2)
Basos: 0 %
EOS (ABSOLUTE): 0.1 10*3/uL (ref 0.0–0.4)
Eos: 2 %
Hematocrit: 30.4 % — ABNORMAL LOW (ref 34.0–46.6)
Hemoglobin: 10.5 g/dL — ABNORMAL LOW (ref 11.1–15.9)
Immature Grans (Abs): 0.1 10*3/uL (ref 0.0–0.1)
Immature Granulocytes: 1 %
Lymphocytes Absolute: 1.2 10*3/uL (ref 0.7–3.1)
Lymphs: 15 %
MCH: 31.9 pg (ref 26.6–33.0)
MCHC: 34.5 g/dL (ref 31.5–35.7)
MCV: 92 fL (ref 79–97)
Monocytes Absolute: 0.6 10*3/uL (ref 0.1–0.9)
Monocytes: 7 %
Neutrophils Absolute: 6 10*3/uL (ref 1.4–7.0)
Neutrophils: 75 %
Platelets: 191 10*3/uL (ref 150–450)
RBC: 3.29 x10E6/uL — ABNORMAL LOW (ref 3.77–5.28)
RDW: 13 % (ref 11.7–15.4)
WBC: 8 10*3/uL (ref 3.4–10.8)

## 2020-04-03 LAB — BASIC METABOLIC PANEL
BUN/Creatinine Ratio: 18 (ref 9–23)
BUN: 10 mg/dL (ref 6–20)
CO2: 20 mmol/L (ref 20–29)
Calcium: 9.8 mg/dL (ref 8.7–10.2)
Chloride: 103 mmol/L (ref 96–106)
Creatinine, Ser: 0.55 mg/dL — ABNORMAL LOW (ref 0.57–1.00)
Glucose: 100 mg/dL — ABNORMAL HIGH (ref 65–99)
Potassium: 4.7 mmol/L (ref 3.5–5.2)
Sodium: 140 mmol/L (ref 134–144)
eGFR: 122 mL/min/{1.73_m2} (ref >59)

## 2020-04-03 LAB — MAGNESIUM: Magnesium: 1.5 mg/dL — ABNORMAL LOW (ref 1.6–2.3)

## 2020-04-03 MED ORDER — MAGNESIUM 400 MG PO CAPS: 400.0000 mg | ORAL_CAPSULE | Freq: Two times a day (BID) | ORAL | 0 refills | Status: DC

## 2020-04-03 NOTE — Telephone Encounter (Signed)
Spoke with patient regarding results and recommendation.  Patient verbalizes understanding and is agreeable to plan of care. Advised patient to call back with any issues or concerns.  

## 2020-04-03 NOTE — Telephone Encounter (Signed)
-----   Message from Thomasene Ripple, DO sent at 04/03/2020 12:02 PM EDT ----- You magnesium is low at 1.5 would like to replete that magnesium oxide 400 mg twice a day for 14 days.  Please repeat blood work after this.  All the labs stable

## 2020-04-09 ENCOUNTER — Ambulatory Visit (HOSPITAL_BASED_OUTPATIENT_CLINIC_OR_DEPARTMENT_OTHER)
Admission: RE | Admit: 2020-04-09 | Discharge: 2020-04-09 | Disposition: A | Discharge status: Home / Self Care | Payer: BC Managed Care – PPO | Source: Ambulatory Visit | Attending: Cardiology | Admitting: Cardiology

## 2020-04-09 ENCOUNTER — Other Ambulatory Visit: Payer: Self-pay

## 2020-04-09 DIAGNOSIS — R0602 Shortness of breath: Secondary | ICD-10-CM

## 2020-04-09 DIAGNOSIS — I471 Supraventricular tachycardia: Secondary | ICD-10-CM | POA: Diagnosis present

## 2020-04-09 LAB — ECHOCARDIOGRAM COMPLETE
AR max vel: 2.69 cm2
AV Area VTI: 2.72 cm2
AV Area mean vel: 2.44 cm2
AV Mean grad: 6 mmHg
AV Peak grad: 11.3 mmHg
Ao pk vel: 1.68 m/s
Area-P 1/2: 4.96 cm2
S' Lateral: 3.16 cm

## 2020-04-17 ENCOUNTER — Other Ambulatory Visit: Payer: Self-pay

## 2020-04-18 ENCOUNTER — Encounter: Payer: Self-pay | Admitting: Family Medicine

## 2020-04-18 ENCOUNTER — Inpatient Hospital Stay: Payer: BC Managed Care – PPO | Admitting: Family Medicine

## 2020-04-19 ENCOUNTER — Telehealth: Payer: Self-pay

## 2020-04-19 NOTE — Telephone Encounter (Signed)
Spoke with patient regarding results and recommendation.  Patient verbalizes understanding and is agreeable to plan of care. Advised patient to call back with any issues or concerns.  

## 2020-04-19 NOTE — Telephone Encounter (Signed)
-----   Message from Thomasene Ripple, DO sent at 04/10/2020  4:44 PM EDT ----- The echo showed that the heart is not fully relaxing like it should ( diastolic dysfunction) ,but otherwise normal. I will discuss it at the next office visit.

## 2020-04-25 ENCOUNTER — Encounter: Payer: Self-pay | Admitting: Family Medicine

## 2020-04-25 ENCOUNTER — Other Ambulatory Visit: Payer: Self-pay

## 2020-04-25 ENCOUNTER — Ambulatory Visit (INDEPENDENT_AMBULATORY_CARE_PROVIDER_SITE_OTHER): Payer: BC Managed Care – PPO | Admitting: Family Medicine

## 2020-04-25 VITALS — BP 136/80 | HR 99 | Temp 98.0°F | Ht 66.0 in | Wt 270.8 lb

## 2020-04-25 DIAGNOSIS — K219 Gastro-esophageal reflux disease without esophagitis: Secondary | ICD-10-CM

## 2020-04-25 DIAGNOSIS — G5601 Carpal tunnel syndrome, right upper limb: Secondary | ICD-10-CM | POA: Diagnosis not present

## 2020-04-25 MED ORDER — OMEPRAZOLE 40 MG PO CPDR: 40.0000 mg | DELAYED_RELEASE_CAPSULE | Freq: Every day | ORAL | 3 refills | Status: DC

## 2020-04-25 MED ORDER — FAMOTIDINE 20 MG PO TABS
20.0000 mg | ORAL_TABLET | Freq: Two times a day (BID) | ORAL | 2 refills | Status: DC
Start: 2020-04-25 — End: 2020-07-30

## 2020-04-25 NOTE — Progress Notes (Signed)
Katie [name] DOB: 1985-06-21 Encounter date: 04/25/2020  This is a 35 y.o. female who presents with Chief Complaint  Patient presents with  . Injections    History of present illness: She is working over 80 hours/week and is now [redacted] weeks pregnant. Her ob is traveling out of the country so she is worried about having a different provider.   Right wrist has been bothering her for at least a month - waking up in middle of night in pain, swelling. Not had carpal tunnel in past. Numb in first three fingers and little in 4th finger. Wore splint for a couple of weeks; helps a little, but still there.   Also with acid reflux symptoms that are worsening and waking her at night. Has been taking omeprazole regularly recently, but wasn't doing this through whole pregnancy.    Allergies  Allergen Reactions  . Flexeril [Cyclobenzaprine Hcl] Shortness Of Breath  . Lorazepam     Other reaction(s): Other Neck spasm   Current Meds  Medication Sig  . blood glucose meter kit and supplies KIT Use as directed twice a day  . Blood Glucose Monitoring Suppl (CONTOUR NEXT ONE) KIT Use as directed  . cetirizine (ZYRTEC) 10 MG tablet Take 10 mg by mouth daily.  . famotidine (PEPCID) 20 MG tablet Take 1 tablet (20 mg total) by mouth 2 (two) times daily. Take in morning and at bedtime  . glucose blood (CONTOUR NEXT TEST) test strip Use as instructed  . Insulin Glargine (LANTUS SOLOSTAR [place] [employer]) Inject 18 Units into the skin daily.  Marland Kitchen labetalol (NORMODYNE) 100 MG tablet Take 100 mg by mouth 2 (two) times daily.  . Lancets MISC Use as directed  . Magnesium 400 MG CAPS Take 400 mg by mouth in the morning and at bedtime.  . metFORMIN (GLUCOPHAGE-XR) 750 MG 24 hr tablet Take 750 mg by mouth daily.  Marland Kitchen omeprazole (PRILOSEC) 40 MG capsule Take 1 capsule (40 mg total) by mouth daily.  . sertraline (ZOLOFT) 25 MG tablet Take 25 mg by mouth daily.  . [DISCONTINUED] omeprazole (PRILOSEC) 20 MG capsule TAKE ONE  CAPSULE BY MOUTH TWICE A DAY BEFORE A MEAL    Review of Systems  Constitutional: Negative for chills, fatigue and fever.  Respiratory: Negative for cough, chest tightness, shortness of breath and wheezing.   Cardiovascular: Negative for chest pain, palpitations and leg swelling.  Gastrointestinal:       Acid reflux that wakes her at night - choking on acid  Musculoskeletal:       See HPI - right wrist pain, worsening even with splint wearing.    Objective:  BP 136/80 (BP Location: Left Arm, Patient Position: Sitting, Cuff Size: Large)   Pulse 99   Temp 98 F (36.7 C) (Oral)   Ht '5\' 6"'  (1.676 m)   Wt 270 lb 12.8 oz (122.8 kg)   LMP 08/30/2019 (Exact Date)   BMI 43.71 kg/m   Weight: 270 lb 12.8 oz (122.8 kg)   BP Readings from Last 3 Encounters:  04/25/20 136/80  04/02/20 130/78  02/29/20 126/77   Wt Readings from Last 3 Encounters:  04/25/20 270 lb 12.8 oz (122.8 kg)  04/02/20 266 lb (120.7 kg)  02/29/20 255 lb (115.7 kg)    Physical Exam Constitutional:      General: She is not in acute distress.    Appearance: She is well-developed.  Cardiovascular:     Rate and Rhythm: Normal rate and regular rhythm.  Heart sounds: Normal heart sounds. No murmur heard. No friction rub.  Pulmonary:     Effort: Pulmonary effort is normal. No respiratory distress.     Breath sounds: Normal breath sounds. No wheezing or rales.  Musculoskeletal:     Right lower leg: No edema.     Left lower leg: No edema.  Neurological:     Mental Status: She is alert and oriented to person, place, and time.  Psychiatric:        Behavior: Behavior normal.    Carpal Tunnel injection procedure note: After verbal consent was obtained and risks and benefits of procedure was discussed (including nerve damage, increased discomfort, lack of resolution of discomfort, etc.) right wrist was placed in extension over towel roll.  Wrist was cleansed with ChloraPrep.  Using 25-gauge needle and after  identifying palmaris longus tendon, combination of lidocaine 1 cc and Kenalog 40 mg was injected in a 45 degree angle into the carpal tunnel.  Patient tolerated procedure well, and did not have any nerve discomfort during procedure.  Sterile bandage applied.   Assessment/Plan  1. Carpal tunnel syndrome on right Injected today; encouraged her to continue with icing and wearing splint day/night.  We discussed that typically carpal tunnel does improve after delivery.  2. Gastroesophageal reflux disease, unspecified whether esophagitis present We discussed changing PPI to Protonix.  I have also given the option of adding in Pepcid.  We discussed limiting foods that promote acid reflux including tomato-based foods (which are her favorite). Let us know if not improving with this.     Return if symptoms worsen or fail to improve.    Micheline Rough, MD

## 2020-04-25 NOTE — Patient Instructions (Addendum)
Wear splint for carpal tunnel day and at night while sleeping.   Start pepcid in morning and bedtime - continue with omeprazole but we are increasing the dose and will have you try to take this 30 minutes prior to dinner.   Carpal Tunnel Syndrome  Carpal tunnel syndrome is a condition that causes pain, numbness, and weakness in your hand and fingers. The carpal tunnel is a narrow area located on the palm side of your wrist. Repeated wrist motion or certain diseases may cause swelling within the tunnel. This swelling pinches the main nerve in the wrist. The main nerve in the wrist is called the median nerve. What are the causes? This condition may be caused by:  Repeated and forceful wrist and hand motions.  Wrist injuries.  Arthritis.  A cyst or tumor in the carpal tunnel.  Fluid buildup during pregnancy.  Use of tools that vibrate. Sometimes the cause of this condition is not known. What increases the risk? The following factors may make you more likely to develop this condition:  Having a job that requires you to repeatedly or forcefully move your wrist or hand or requires you to use tools that vibrate. This may include jobs that involve using computers, working on an [employer]assembly line, or working with power tools such as Radiographer, therapeuticdrills or sanders.  Being a woman.  Having certain conditions, such as: ? Diabetes. ? Obesity. ? An underactive thyroid (hypothyroidism). ? Kidney failure. ? Rheumatoid arthritis. What are the signs or symptoms? Symptoms of this condition include:  A tingling feeling in your fingers, especially in your thumb, index, and middle fingers.  Tingling or numbness in your hand.  An aching feeling in your entire arm, especially when your wrist and elbow are bent for a long time.  Wrist pain that goes up your arm to your shoulder.  Pain that goes down into your palm or fingers.  A weak feeling in your hands. You may have trouble grabbing and holding items. Your  symptoms may feel worse during the night. How is this diagnosed? This condition is diagnosed with a medical history and physical exam. You may also have tests, including:  Electromyogram (EMG). This test measures electrical signals sent by your nerves into the muscles.  Nerve conduction study. This test measures how well electrical signals pass through your nerves.  Imaging tests, such as X-rays, ultrasound, and MRI. These tests check for possible causes of your condition. How is this treated? This condition may be treated with:  Lifestyle changes. It is important to stop or change the activity that caused your condition.  Doing exercise and activities to strengthen and stretch your muscles and tendons (physical therapy).  Making lifestyle changes to help with your condition and learning how to do your daily activities safely (occupational therapy).  Medicines for pain and inflammation. This may include medicine that is injected into your wrist.  A wrist splint or brace.  Surgery. Follow these instructions at home: If you have a splint or brace:  Wear the splint or brace as told by your health care provider. Remove it only as told by your health care provider.  Loosen the splint or brace if your fingers tingle, become numb, or turn cold and blue.  Keep the splint or brace clean.  If the splint or brace is not waterproof: ? Do not let it get wet. ? Cover it with a watertight covering when you take a bath or shower. Managing pain, stiffness, and swelling If directed,  put ice on the painful area. To do this:  If you have a removeable splint or brace, remove it as told by your health care provider.  Put ice in a plastic bag.  Place a towel between your skin and the bag or between the splint or brace and the bag.  Leave the ice on for 20 minutes, 2-3 times a day. Do not fall asleep with the cold pack on your skin.  Remove the ice if your skin turns bright red. This is very  important. If you cannot feel pain, heat, or cold, you have a greater risk of damage to the area. Move your fingers often to reduce stiffness and swelling.   General instructions  Take over-the-counter and prescription medicines only as told by your health care provider.  Rest your wrist and hand from any activity that may be causing your pain. If your condition is work related, talk with your employer about changes that can be made, such as getting a wrist pad to use while typing.  Do any exercises as told by your health care provider, physical therapist, or occupational therapist.  Keep all follow-up visits. This is important. Contact a health care provider if:  You have new symptoms.  Your pain is not controlled with medicines.  Your symptoms get worse. Get help right away if:  You have severe numbness or tingling in your wrist or hand. Summary  Carpal tunnel syndrome is a condition that causes pain, numbness, and weakness in your hand and fingers.  It is usually caused by repeated wrist motions.  Lifestyle changes and medicines are used to treat carpal tunnel syndrome. Surgery may be recommended.  Follow your health care provider's instructions about wearing a splint, resting from activity, keeping follow-up visits, and calling for help. This information is not intended to replace advice given to you by your health care provider. Make sure you discuss any questions you have with your health care provider. Document Revised: 05/12/2019 Document Reviewed: 05/12/2019 Elsevier Patient Education  2021 Elsevier Inc.  Food Choices for Gastroesophageal Reflux Disease, Adult When you have gastroesophageal reflux disease (GERD), the foods you eat and your eating habits are very important. Choosing the right foods can help ease the discomfort of GERD. Consider working with a dietitian to help you make healthy food choices. What are tips for following this plan? Reading food  labels  Look for foods that are low in saturated fat. Foods that have less than 5% of daily value (DV) of fat and 0 g of trans fats may help with your symptoms. Cooking  Cook foods using methods other than frying. This may include baking, steaming, grilling, or broiling. These are all methods that do not need a lot of fat for cooking.  To add flavor, try to use herbs that are low in spice and acidity. Meal planning  Choose healthy foods that are low in fat, such as fruits, vegetables, whole grains, low-fat dairy products, lean meats, fish, and poultry.  Eat frequent, small meals instead of three large meals each day. Eat your meals slowly, in a relaxed setting. Avoid bending over or lying down until 2-3 hours after eating.  Limit high-fat foods such as fatty meats or fried foods.  Limit your intake of fatty foods, such as oils, butter, and shortening.  Avoid the following as told by your health care provider: ? Foods that cause symptoms. These may be different for different people. Keep a food diary to keep track of foods  that cause symptoms. ? Alcohol. ? Drinking large amounts of liquid with meals. ? Eating meals during the 2-3 hours before bed.   Lifestyle  Maintain a healthy weight. Ask your health care provider what weight is healthy for you. If you need to lose weight, work with your health care provider to do so safely.  Exercise for at least 30 minutes on 5 or more days each week, or as told by your health care provider.  Avoid wearing clothes that fit tightly around your waist and chest.  Do not use any products that contain nicotine or tobacco. These products include cigarettes, chewing tobacco, and vaping devices, such as e-cigarettes. If you need help quitting, ask your health care provider.  Sleep with the head of your bed raised. Use a wedge under the mattress or blocks under the bed frame to raise the head of the bed.  Chew sugar-free gum after mealtimes. What foods  should I eat? Eat a healthy, well-balanced diet of fruits, vegetables, whole grains, low-fat dairy products, lean meats, fish, and poultry. Each person is different. Foods that may trigger symptoms in one person may not trigger any symptoms in another person. Work with your health care provider to identify foods that are safe for you. The items listed above may not be a complete list of recommended foods and beverages. Contact a dietitian for more information.   What foods should I avoid? Limiting some of these foods may help manage the symptoms of GERD. Everyone is different. Consult a dietitian or your health care provider to help you identify the exact foods to avoid, if any. Fruits Any fruits prepared with added fat. Any fruits that cause symptoms. For some people this may include citrus fruits, such as oranges, grapefruit, pineapple, and lemons. Vegetables Deep-fried vegetables. [place] fries. Any vegetables prepared with added fat. Any vegetables that cause symptoms. For some people, this may include tomatoes and tomato products, chili peppers, onions and garlic, and horseradish. Grains Pastries or quick breads with added fat. Meats and other proteins High-fat meats, such as fatty beef or pork, hot dogs, ribs, ham, sausage, salami, and bacon. Fried meat or protein, including fried fish and fried chicken. Nuts and nut butters, in large amounts. Dairy Whole milk and chocolate milk. Sour cream. Cream. Ice cream. Cream cheese. Milkshakes. Fats and oils Butter. Margarine. Shortening. Ghee. Beverages Coffee and tea, with or without caffeine. Carbonated beverages. Sodas. Energy drinks. Fruit juice made with acidic fruits, such as orange or grapefruit. Tomato juice. Alcoholic drinks. Sweets and desserts Chocolate and cocoa. Donuts. Seasonings and condiments Pepper. Peppermint and spearmint. Added salt. Any condiments, herbs, or seasonings that cause symptoms. For some people, this may include  curry, hot sauce, or vinegar-based salad dressings. The items listed above may not be a complete list of foods and beverages to avoid. Contact a dietitian for more information. Questions to ask your health care provider Diet and lifestyle changes are usually the first steps that are taken to manage symptoms of GERD. If diet and lifestyle changes do not improve your symptoms, talk with your health care provider about taking medicines. Where to find more information  International Foundation for Gastrointestinal Disorders: aboutgerd.org Summary  When you have gastroesophageal reflux disease (GERD), food and lifestyle choices may be very helpful in easing the discomfort of GERD.  Eat frequent, small meals instead of three large meals each day. Eat your meals slowly, in a relaxed setting. Avoid bending over or lying down until 2-3 hours after  eating.  Limit high-fat foods such as fatty meats or fried foods. This information is not intended to replace advice given to you by your health care provider. Make sure you discuss any questions you have with your health care provider. Document Revised: 07/11/2019 Document Reviewed: 07/11/2019 Elsevier Patient Education  2021 [employer].

## 2020-04-26 ENCOUNTER — Encounter: Payer: Self-pay | Admitting: Family Medicine

## 2020-05-04 ENCOUNTER — Ambulatory Visit: Payer: BC Managed Care – PPO | Admitting: Cardiology

## 2020-05-06 ENCOUNTER — Other Ambulatory Visit: Payer: Self-pay | Admitting: Family Medicine

## 2020-05-10 ENCOUNTER — Encounter: Payer: Self-pay | Admitting: Family Medicine

## 2020-05-14 ENCOUNTER — Encounter (HOSPITAL_COMMUNITY): Payer: Self-pay | Admitting: Obstetrics and Gynecology

## 2020-05-14 ENCOUNTER — Inpatient Hospital Stay (HOSPITAL_COMMUNITY)
Admission: AD | Admit: 2020-05-14 | Discharge: 2020-05-14 | Disposition: A | Discharge status: Home / Self Care | Payer: BC Managed Care – PPO | Attending: Obstetrics and Gynecology | Admitting: Obstetrics and Gynecology

## 2020-05-14 ENCOUNTER — Other Ambulatory Visit: Payer: Self-pay

## 2020-05-14 DIAGNOSIS — O10013 Pre-existing essential hypertension complicating pregnancy, third trimester: Secondary | ICD-10-CM

## 2020-05-14 DIAGNOSIS — O09523 Supervision of elderly multigravida, third trimester: Secondary | ICD-10-CM | POA: Diagnosis not present

## 2020-05-14 DIAGNOSIS — O10913 Unspecified pre-existing hypertension complicating pregnancy, third trimester: Secondary | ICD-10-CM | POA: Insufficient documentation

## 2020-05-14 DIAGNOSIS — Z87891 Personal history of nicotine dependence: Secondary | ICD-10-CM | POA: Diagnosis not present

## 2020-05-14 DIAGNOSIS — Z3A36 36 weeks gestation of pregnancy: Secondary | ICD-10-CM | POA: Diagnosis not present

## 2020-05-14 DIAGNOSIS — Z79899 Other long term (current) drug therapy: Secondary | ICD-10-CM | POA: Diagnosis not present

## 2020-05-14 DIAGNOSIS — Z7984 Long term (current) use of oral hypoglycemic drugs: Secondary | ICD-10-CM | POA: Insufficient documentation

## 2020-05-14 DIAGNOSIS — Z794 Long term (current) use of insulin: Secondary | ICD-10-CM | POA: Diagnosis not present

## 2020-05-14 DIAGNOSIS — O10919 Unspecified pre-existing hypertension complicating pregnancy, unspecified trimester: Secondary | ICD-10-CM

## 2020-05-14 LAB — URINALYSIS, ROUTINE W REFLEX MICROSCOPIC
Bilirubin Urine: NEGATIVE
Glucose, UA: NEGATIVE mg/dL
Hgb urine dipstick: NEGATIVE
Ketones, ur: NEGATIVE mg/dL
Leukocytes,Ua: NEGATIVE
Nitrite: NEGATIVE
Protein, ur: NEGATIVE mg/dL
Specific Gravity, Urine: 1.013 (ref 1.005–1.030)
pH: 7 (ref 5.0–8.0)

## 2020-05-14 LAB — COMPREHENSIVE METABOLIC PANEL
ALT: 17 U/L (ref 0–44)
AST: 14 U/L — ABNORMAL LOW (ref 15–41)
Albumin: 2.7 g/dL — ABNORMAL LOW (ref 3.5–5.0)
Alkaline Phosphatase: 38 U/L (ref 38–126)
Anion gap: 8 (ref 5–15)
BUN: 11 mg/dL (ref 6–20)
CO2: 22 mmol/L (ref 22–32)
Calcium: 8.9 mg/dL (ref 8.9–10.3)
Chloride: 106 mmol/L (ref 98–111)
Creatinine, Ser: 0.63 mg/dL (ref 0.44–1.00)
GFR, Estimated: >60 mL/min (ref >60)
Glucose, Bld: 153 mg/dL — ABNORMAL HIGH (ref 70–99)
Potassium: 4.5 mmol/L (ref 3.5–5.1)
Sodium: 136 mmol/L (ref 135–145)
Total Bilirubin: 0.6 mg/dL (ref 0.3–1.2)
Total Protein: 5.7 g/dL — ABNORMAL LOW (ref 6.5–8.1)

## 2020-05-14 LAB — CBC
HCT: 32.8 % — ABNORMAL LOW (ref 36.0–46.0)
Hemoglobin: 10.5 g/dL — ABNORMAL LOW (ref 12.0–15.0)
MCH: 31.4 pg (ref 26.0–34.0)
MCHC: 32 g/dL (ref 30.0–36.0)
MCV: 98.2 fL (ref 80.0–100.0)
Platelets: 192 10*3/uL (ref 150–400)
RBC: 3.34 MIL/uL — ABNORMAL LOW (ref 3.87–5.11)
RDW: 14.1 % (ref 11.5–15.5)
WBC: 7.2 10*3/uL (ref 4.0–10.5)
nRBC: 0 % (ref 0.0–0.2)

## 2020-05-14 LAB — PROTEIN / CREATININE RATIO, URINE
Creatinine, Urine: 65.1 mg/dL
Protein Creatinine Ratio: 0.12 mg/mg{Cre} (ref 0.00–0.15)
Total Protein, Urine: 8 mg/dL

## 2020-05-14 NOTE — Discharge Instructions (Signed)

## 2020-05-14 NOTE — MAU Note (Signed)
Katie [name] is a 35 y.o. at [redacted]w[redacted]d here in MAU reporting: here for HTN eval, states she checks her BP at home and noticed it has been going up. Went to the office today and it was elevated so they sent her in. Sometimes get a headache but denies pain currently. No visual changes. No regular contractons, LOF, or bleeding. +FM  Has CHTN, on 200mg  TID. Took AM dose and states next dose is due around now.   Onset of complaint: ongoing  Pain score: 0/10  Vitals:   05/14/20 1327  BP: 125/71  Pulse: (!) 102  Resp: 20  Temp: 99.3 F (37.4 C)  SpO2: 99%     FHT: EFM applied in room  Lab orders placed from triage: UA
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
## 2020-05-15 ENCOUNTER — Telehealth (HOSPITAL_COMMUNITY): Payer: Self-pay | Admitting: *Deleted

## 2020-05-15 NOTE — Telephone Encounter (Signed)
Preadmission screen  

## 2020-05-18 ENCOUNTER — Encounter (HOSPITAL_COMMUNITY): Payer: Self-pay | Admitting: *Deleted

## 2020-05-22 LAB — OB RESULTS CONSOLE GBS: GBS: POSITIVE

## 2020-05-25 ENCOUNTER — Other Ambulatory Visit: Payer: Self-pay | Admitting: Obstetrics & Gynecology

## 2020-05-28 ENCOUNTER — Other Ambulatory Visit (HOSPITAL_COMMUNITY): Payer: BC Managed Care – PPO | Attending: Obstetrics & Gynecology

## 2020-05-29 ENCOUNTER — Inpatient Hospital Stay (HOSPITAL_COMMUNITY): Payer: BC Managed Care – PPO | Admitting: Anesthesiology

## 2020-05-29 ENCOUNTER — Other Ambulatory Visit: Payer: Self-pay

## 2020-05-29 ENCOUNTER — Encounter (HOSPITAL_COMMUNITY): Payer: Self-pay | Admitting: Obstetrics & Gynecology

## 2020-05-29 ENCOUNTER — Inpatient Hospital Stay (HOSPITAL_COMMUNITY): Payer: BC Managed Care – PPO

## 2020-05-29 ENCOUNTER — Inpatient Hospital Stay (HOSPITAL_COMMUNITY)
Admission: AD | Admit: 2020-05-29 | Discharge: 2020-06-02 | DRG: 786 | Disposition: A | Discharge status: Home / Self Care | Payer: BC Managed Care – PPO | Attending: Obstetrics & Gynecology | Admitting: Obstetrics & Gynecology

## 2020-05-29 DIAGNOSIS — O9081 Anemia of the puerperium: Secondary | ICD-10-CM | POA: Diagnosis not present

## 2020-05-29 DIAGNOSIS — F603 Borderline personality disorder: Secondary | ICD-10-CM | POA: Diagnosis present

## 2020-05-29 DIAGNOSIS — Z7984 Long term (current) use of oral hypoglycemic drugs: Secondary | ICD-10-CM | POA: Diagnosis not present

## 2020-05-29 DIAGNOSIS — Z87891 Personal history of nicotine dependence: Secondary | ICD-10-CM | POA: Diagnosis not present

## 2020-05-29 DIAGNOSIS — O1002 Pre-existing essential hypertension complicating childbirth: Principal | ICD-10-CM | POA: Diagnosis present

## 2020-05-29 DIAGNOSIS — Z3490 Encounter for supervision of normal pregnancy, unspecified, unspecified trimester: Secondary | ICD-10-CM | POA: Diagnosis present

## 2020-05-29 DIAGNOSIS — O2412 Pre-existing diabetes mellitus, type 2, in childbirth: Secondary | ICD-10-CM | POA: Diagnosis present

## 2020-05-29 DIAGNOSIS — O26893 Other specified pregnancy related conditions, third trimester: Secondary | ICD-10-CM | POA: Diagnosis present

## 2020-05-29 DIAGNOSIS — F32A Depression, unspecified: Secondary | ICD-10-CM | POA: Diagnosis present

## 2020-05-29 DIAGNOSIS — O99892 Other specified diseases and conditions complicating childbirth: Secondary | ICD-10-CM | POA: Diagnosis present

## 2020-05-29 DIAGNOSIS — F909 Attention-deficit hyperactivity disorder, unspecified type: Secondary | ICD-10-CM | POA: Diagnosis present

## 2020-05-29 DIAGNOSIS — F419 Anxiety disorder, unspecified: Secondary | ICD-10-CM | POA: Diagnosis present

## 2020-05-29 DIAGNOSIS — O09523 Supervision of elderly multigravida, third trimester: Secondary | ICD-10-CM | POA: Diagnosis present

## 2020-05-29 DIAGNOSIS — O99824 Streptococcus B carrier state complicating childbirth: Secondary | ICD-10-CM | POA: Diagnosis present

## 2020-05-29 DIAGNOSIS — O99214 Obesity complicating childbirth: Secondary | ICD-10-CM | POA: Diagnosis present

## 2020-05-29 DIAGNOSIS — Z794 Long term (current) use of insulin: Secondary | ICD-10-CM | POA: Diagnosis not present

## 2020-05-29 DIAGNOSIS — Z3A39 39 weeks gestation of pregnancy: Secondary | ICD-10-CM | POA: Diagnosis not present

## 2020-05-29 DIAGNOSIS — D62 Acute posthemorrhagic anemia: Secondary | ICD-10-CM | POA: Diagnosis not present

## 2020-05-29 DIAGNOSIS — E119 Type 2 diabetes mellitus without complications: Secondary | ICD-10-CM | POA: Diagnosis present

## 2020-05-29 DIAGNOSIS — Z20822 Contact with and (suspected) exposure to covid-19: Secondary | ICD-10-CM | POA: Diagnosis present

## 2020-05-29 DIAGNOSIS — F339 Major depressive disorder, recurrent, unspecified: Secondary | ICD-10-CM | POA: Diagnosis present

## 2020-05-29 DIAGNOSIS — O99344 Other mental disorders complicating childbirth: Secondary | ICD-10-CM | POA: Diagnosis present

## 2020-05-29 DIAGNOSIS — Z98891 History of uterine scar from previous surgery: Secondary | ICD-10-CM

## 2020-05-29 DIAGNOSIS — O163 Unspecified maternal hypertension, third trimester: Secondary | ICD-10-CM | POA: Diagnosis present

## 2020-05-29 LAB — COMPREHENSIVE METABOLIC PANEL
ALT: 19 U/L (ref 0–44)
AST: 18 U/L (ref 15–41)
Albumin: 2.8 g/dL — ABNORMAL LOW (ref 3.5–5.0)
Alkaline Phosphatase: 48 U/L (ref 38–126)
Anion gap: 11 (ref 5–15)
BUN: 10 mg/dL (ref 6–20)
CO2: 18 mmol/L — ABNORMAL LOW (ref 22–32)
Calcium: 9.2 mg/dL (ref 8.9–10.3)
Chloride: 108 mmol/L (ref 98–111)
Creatinine, Ser: 0.72 mg/dL (ref 0.44–1.00)
GFR, Estimated: >60 mL/min (ref >60)
Glucose, Bld: 168 mg/dL — ABNORMAL HIGH (ref 70–99)
Potassium: 4.2 mmol/L (ref 3.5–5.1)
Sodium: 137 mmol/L (ref 135–145)
Total Bilirubin: 0.4 mg/dL (ref 0.3–1.2)
Total Protein: 5.6 g/dL — ABNORMAL LOW (ref 6.5–8.1)

## 2020-05-29 LAB — CBC
HCT: 35.1 % — ABNORMAL LOW (ref 36.0–46.0)
HCT: 36 % (ref 36.0–46.0)
Hemoglobin: 11.5 g/dL — ABNORMAL LOW (ref 12.0–15.0)
Hemoglobin: 11.3 g/dL — ABNORMAL LOW (ref 12.0–15.0)
MCH: 31.9 pg (ref 26.0–34.0)
MCH: 30.7 pg (ref 26.0–34.0)
MCHC: 32.8 g/dL (ref 30.0–36.0)
MCHC: 31.4 g/dL (ref 30.0–36.0)
MCV: 97.2 fL (ref 80.0–100.0)
MCV: 97.8 fL (ref 80.0–100.0)
Platelets: 205 10*3/uL (ref 150–400)
Platelets: 210 10*3/uL (ref 150–400)
RBC: 3.61 MIL/uL — ABNORMAL LOW (ref 3.87–5.11)
RBC: 3.68 MIL/uL — ABNORMAL LOW (ref 3.87–5.11)
RDW: 14 % (ref 11.5–15.5)
RDW: 14.2 % (ref 11.5–15.5)
WBC: 8 10*3/uL (ref 4.0–10.5)
WBC: 8.3 10*3/uL (ref 4.0–10.5)
nRBC: 0 % (ref 0.0–0.2)
nRBC: 0 % (ref 0.0–0.2)

## 2020-05-29 LAB — GLUCOSE, CAPILLARY
Glucose-Capillary: 152 mg/dL — ABNORMAL HIGH (ref 70–99)
Glucose-Capillary: 123 mg/dL — ABNORMAL HIGH (ref 70–99)
Glucose-Capillary: 98 mg/dL (ref 70–99)
Glucose-Capillary: 97 mg/dL (ref 70–99)

## 2020-05-29 LAB — TYPE AND SCREEN
ABO/RH(D): O POS
Antibody Screen: NEGATIVE

## 2020-05-29 LAB — RESP PANEL BY RT-PCR (FLU A&B, COVID) ARPGX2
Influenza A by PCR: NEGATIVE
Influenza B by PCR: NEGATIVE
SARS Coronavirus 2 by RT PCR: NEGATIVE

## 2020-05-29 LAB — PROTEIN / CREATININE RATIO, URINE
Creatinine, Urine: 116.87 mg/dL
Protein Creatinine Ratio: 0.15 mg/mg{Cre} (ref 0.00–0.15)
Total Protein, Urine: 17 mg/dL

## 2020-05-29 MED ORDER — SODIUM CHLORIDE 0.9 % IV SOLN
5.0000 10*6.[IU] | Freq: Once | INTRAVENOUS | Status: AC
  Administered 2020-05-29: 5 10*6.[IU] via INTRAVENOUS
  Filled 2020-05-29: qty 5

## 2020-05-29 MED ORDER — LIDOCAINE HCL (PF) 1 % IJ SOLN: 30.0000 mL | INTRAMUSCULAR | Status: DC | PRN

## 2020-05-29 MED ORDER — OXYTOCIN-SODIUM CHLORIDE 30-0.9 UT/500ML-% IV SOLN
1.0000 m[IU]/min | INTRAVENOUS | Status: DC
  Administered 2020-05-29: 2 m[IU]/min via INTRAVENOUS
  Administered 2020-05-30: 4 m[IU]/min via INTRAVENOUS
  Filled 2020-05-29: qty 500

## 2020-05-29 MED ORDER — ONDANSETRON HCL 4 MG/2ML IJ SOLN: 4.0000 mg | Freq: Four times a day (QID) | INTRAMUSCULAR | Status: DC | PRN

## 2020-05-29 MED ORDER — BUTALBITAL-APAP-CAFFEINE 50-325-40 MG PO TABS
1.0000 | ORAL_TABLET | Freq: Four times a day (QID) | ORAL | Status: DC | PRN
  Administered 2020-05-29: 1 via ORAL
  Filled 2020-05-29: qty 1

## 2020-05-29 MED ORDER — EPHEDRINE 5 MG/ML INJ: 10.0000 mg | INTRAVENOUS | Status: DC | PRN

## 2020-05-29 MED ORDER — LACTATED RINGERS IV SOLN
500.0000 mL | Freq: Once | INTRAVENOUS | Status: AC
  Administered 2020-05-29: 500 mL via INTRAVENOUS

## 2020-05-29 MED ORDER — TERBUTALINE SULFATE 1 MG/ML IJ SOLN: 0.2500 mg | Freq: Once | INTRAMUSCULAR | Status: DC | PRN

## 2020-05-29 MED ORDER — FENTANYL-BUPIVACAINE-NACL 0.5-0.125-0.9 MG/250ML-% EP SOLN
12.0000 mL/h | EPIDURAL | Status: DC | PRN
  Administered 2020-05-29: 12 mL/h via EPIDURAL
  Filled 2020-05-29: qty 250

## 2020-05-29 MED ORDER — LACTATED RINGERS IV SOLN
500.0000 mL | INTRAVENOUS | Status: DC | PRN
  Administered 2020-05-30: 500 mL via INTRAVENOUS

## 2020-05-29 MED ORDER — OXYTOCIN-SODIUM CHLORIDE 30-0.9 UT/500ML-% IV SOLN: 2.5000 [IU]/h | INTRAVENOUS | Status: DC

## 2020-05-29 MED ORDER — PHENYLEPHRINE 40 MCG/ML (10ML) SYRINGE FOR IV PUSH (FOR BLOOD PRESSURE SUPPORT): 80.0000 ug | PREFILLED_SYRINGE | INTRAVENOUS | Status: DC | PRN

## 2020-05-29 MED ORDER — OXYTOCIN BOLUS FROM INFUSION: 333.0000 mL | Freq: Once | INTRAVENOUS | Status: DC

## 2020-05-29 MED ORDER — BUTALBITAL-APAP-CAFFEINE 50-325-40 MG PO TABS
1.0000 | ORAL_TABLET | Freq: Once | ORAL | Status: AC
  Administered 2020-05-29: 1 via ORAL
  Filled 2020-05-29: qty 1

## 2020-05-29 MED ORDER — INSULIN ASPART 100 UNIT/ML IJ SOLN
2.0000 [IU] | Freq: Once | INTRAMUSCULAR | Status: AC
  Administered 2020-05-29: 2 [IU] via SUBCUTANEOUS

## 2020-05-29 MED ORDER — LIDOCAINE HCL (PF) 1 % IJ SOLN
INTRAMUSCULAR | Status: DC | PRN
  Administered 2020-05-29 (×2): 4 mL via EPIDURAL

## 2020-05-29 MED ORDER — LACTATED RINGERS IV SOLN: INTRAVENOUS | Status: DC

## 2020-05-29 MED ORDER — PENICILLIN G POT IN DEXTROSE 60000 UNIT/ML IV SOLN
3.0000 10*6.[IU] | INTRAVENOUS | Status: DC
  Administered 2020-05-29 – 2020-05-30 (×4): 3 10*6.[IU] via INTRAVENOUS
  Filled 2020-05-29 (×4): qty 50

## 2020-05-29 MED ORDER — PROMETHAZINE HCL 25 MG PO TABS
25.0000 mg | ORAL_TABLET | Freq: Once | ORAL | Status: AC
  Administered 2020-05-29: 25 mg via ORAL
  Filled 2020-05-29: qty 1

## 2020-05-29 MED ORDER — PHENYLEPHRINE 40 MCG/ML (10ML) SYRINGE FOR IV PUSH (FOR BLOOD PRESSURE SUPPORT)
80.0000 ug | PREFILLED_SYRINGE | INTRAVENOUS | Status: DC | PRN
  Filled 2020-05-29: qty 10

## 2020-05-29 MED ORDER — DIPHENHYDRAMINE HCL 50 MG/ML IJ SOLN: 12.5000 mg | INTRAMUSCULAR | Status: DC | PRN

## 2020-05-29 MED ORDER — SOD CITRATE-CITRIC ACID 500-334 MG/5ML PO SOLN: 30.0000 mL | ORAL | Status: DC | PRN

## 2020-05-29 MED ORDER — ACETAMINOPHEN 325 MG PO TABS: 650.0000 mg | ORAL_TABLET | ORAL | Status: DC | PRN

## 2020-05-29 NOTE — Anesthesia Preprocedure Evaluation (Signed)
Anesthesia Evaluation  Patient identified by MRN, date of birth, ID band Patient awake    Reviewed: Allergy & Precautions, Patient's Chart, lab work & pertinent test results, reviewed documented beta blocker date and time   History of Anesthesia Complications Negative for: history of anesthetic complications  Airway Mallampati: II  TM Distance: >3 FB Neck ROM: Full    Dental no notable dental hx.    Pulmonary asthma , former smoker,    Pulmonary exam normal        Cardiovascular hypertension, Pt. on medications and Pt. on home beta blockers Normal cardiovascular exam     Neuro/Psych Anxiety Depression negative neurological ROS     GI/Hepatic negative GI ROS, Neg liver ROS,   Endo/Other  diabetes, Type obesity  Renal/GU negative Renal ROS  negative genitourinary   Musculoskeletal  (+) Arthritis , Fibromyalgia -  Abdominal   Peds  Hematology negative hematology ROS (+)   Anesthesia Other Findings Day of surgery medications reviewed with patient.  Reproductive/Obstetrics (+) Pregnancy                             Anesthesia Physical Anesthesia Plan  ASA: III  Anesthesia Plan: Epidural   Post-op Pain Management:    Induction:   PONV Risk Score and Plan: Treatment may vary due to age or medical condition  Airway Management Planned: Natural Airway  Additional Equipment:   Intra-op Plan:   Post-operative Plan:   Informed Consent: I have reviewed the patients History and Physical, chart, labs and discussed the procedure including the risks, benefits and alternatives for the proposed anesthesia with the patient or authorized representative who has indicated his/her understanding and acceptance.       Plan Discussed with:   Anesthesia Plan Comments:         Anesthesia Quick Evaluation
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
## 2020-05-29 NOTE — Progress Notes (Signed)
Subjective: Doing well, epidural working  Objective: BP 138/74   Pulse 77   Temp 98.2 F (36.8 C)   Resp 18   Ht 5\' 6"  (1.676 m)   Wt 128.8 kg   LMP 08/30/2019 (Exact Date)   SpO2 100%   BMI 45.83 kg/m   FHT:  FHR: 150 bpm, variability: moderate,  accelerations:  Present,  decelerations:  Present recent repetitive variable decels UC:   regular, every 2-3 minutes, pitocin at 18 mu rate  SVE:   Dilation: 8.5 Effacement (%): 100 Station: -3 Exam by:: Dr. [email or web address] BBOW, controlled AROM, head well applied to cx. Meconium stained fluid., FSE placed   Assessment / Plan: IOL for DM II and CHTN. Macrosomia, shoulder dystocia risk and plan d/w pt and RN Station high, assess in 1hr, tried exaggerated Sims on both sides, baby didn't tolerate, so changed to sit up in chair pose.   Fetal Wellbeing: cat II recently, overall cat I  Pain Control:  Epidural GBS (+), PCN per protocol  DM II - continue q 2 hr CBGs CHTN- took AM Labetalol 200mg , holding doses since BP mostly 120s/ 80s except last one. Now reports HA, plan repeat Fioricet dose  Anticipated MOD:  Guarded, working towards SVD  Juliene Pina 05/29/2020, 11:31 PM

## 2020-05-29 NOTE — Anesthesia Procedure Notes (Signed)
Epidural Patient location during procedure: OB Start time: 05/29/2020 8:57 PM End time: 05/29/2020 9:00 PM  Staffing Anesthesiologist: Kaylyn Layer, MD Performed: anesthesiologist   Preanesthetic Checklist Completed: patient identified, IV checked, risks and benefits discussed, monitors and equipment checked, pre-op evaluation and timeout performed  Epidural Patient position: sitting Prep: DuraPrep and site prepped and draped Patient monitoring: continuous pulse ox, blood pressure and heart rate Approach: midline Location: L3-L4 Injection technique: LOR air  Needle:  Needle type: Tuohy  Needle gauge: 17 G Needle length: 9 cm Needle insertion depth: 6 cm Catheter type: closed end flexible Catheter size: 19 Gauge Catheter at skin depth: 11 cm Test dose: negative and Other (1% lidocaine)  Assessment Events: blood not aspirated, injection not painful, no injection resistance, no paresthesia and negative IV test  Additional Notes Patient identified. Risks, benefits, and alternatives discussed with patient including but not limited to bleeding, infection, nerve damage, paralysis, failed block, incomplete pain control, headache, blood pressure changes, nausea, vomiting, reactions to medication, itching, and postpartum back pain. Confirmed with bedside nurse the patient's most recent platelet count. Confirmed with patient that they are not currently taking any anticoagulation, have any bleeding history, or any family history of bleeding disorders. Patient expressed understanding and wished to proceed. All questions were answered. Sterile technique was used throughout the entire procedure. Please see nursing notes for vital signs.   Crisp LOR on first pass. Test dose was given through epidural catheter and negative prior to continuing to dose epidural or start infusion. Warning signs of high block given to the patient including shortness of breath, tingling/numbness in hands, complete  motor block, or any concerning symptoms with instructions to call for help. Patient was given instructions on fall risk and not to get out of bed. All questions and concerns addressed with instructions to call with any issues or inadequate analgesia.  Reason for block:procedure for pain

## 2020-05-30 ENCOUNTER — Encounter (HOSPITAL_COMMUNITY): Payer: Self-pay | Admitting: Obstetrics & Gynecology

## 2020-05-30 ENCOUNTER — Encounter (HOSPITAL_COMMUNITY)
Admission: AD | Disposition: A | Discharge status: Home / Self Care | Payer: Self-pay | Source: Home / Self Care | Attending: Obstetrics & Gynecology

## 2020-05-30 DIAGNOSIS — O99892 Other specified diseases and conditions complicating childbirth: Secondary | ICD-10-CM | POA: Diagnosis present

## 2020-05-30 DIAGNOSIS — Z98891 History of uterine scar from previous surgery: Secondary | ICD-10-CM

## 2020-05-30 LAB — GLUCOSE, CAPILLARY
Glucose-Capillary: 95 mg/dL (ref 70–99)
Glucose-Capillary: 99 mg/dL (ref 70–99)
Glucose-Capillary: 98 mg/dL (ref 70–99)
Glucose-Capillary: 90 mg/dL (ref 70–99)
Glucose-Capillary: 83 mg/dL (ref 70–99)
Glucose-Capillary: 117 mg/dL — ABNORMAL HIGH (ref 70–99)
Glucose-Capillary: 120 mg/dL — ABNORMAL HIGH (ref 70–99)
Glucose-Capillary: 133 mg/dL — ABNORMAL HIGH (ref 70–99)

## 2020-05-30 LAB — RPR: RPR Ser Ql: NONREACTIVE

## 2020-05-30 SURGERY — Surgical Case
Anesthesia: Epidural

## 2020-05-30 MED ORDER — MORPHINE SULFATE (PF) 0.5 MG/ML IJ SOLN
INTRAMUSCULAR | Status: DC | PRN
  Administered 2020-05-30: 3 mg via EPIDURAL

## 2020-05-30 MED ORDER — ACETAMINOPHEN 500 MG PO TABS
1000.0000 mg | ORAL_TABLET | Freq: Four times a day (QID) | ORAL | Status: DC
  Administered 2020-05-30 – 2020-06-02 (×12): 1000 mg via ORAL
  Filled 2020-05-30 (×12): qty 2

## 2020-05-30 MED ORDER — WITCH HAZEL-GLYCERIN EX PADS: 1.0000 "application " | MEDICATED_PAD | CUTANEOUS | Status: DC | PRN

## 2020-05-30 MED ORDER — MORPHINE SULFATE (PF) 0.5 MG/ML IJ SOLN: INTRAMUSCULAR | Status: DC | PRN

## 2020-05-30 MED ORDER — IBUPROFEN 600 MG PO TABS
600.0000 mg | ORAL_TABLET | Freq: Four times a day (QID) | ORAL | Status: DC
  Administered 2020-05-31 – 2020-06-02 (×9): 600 mg via ORAL
  Filled 2020-05-30 (×9): qty 1

## 2020-05-30 MED ORDER — NALBUPHINE HCL 10 MG/ML IJ SOLN: 5.0000 mg | INTRAMUSCULAR | Status: DC | PRN

## 2020-05-30 MED ORDER — NALOXONE HCL 0.4 MG/ML IJ SOLN: 0.4000 mg | INTRAMUSCULAR | Status: DC | PRN

## 2020-05-30 MED ORDER — SERTRALINE HCL 50 MG PO TABS
50.0000 mg | ORAL_TABLET | Freq: Every day | ORAL | Status: DC
  Administered 2020-05-30 – 2020-06-01 (×3): 50 mg via ORAL
  Filled 2020-05-30 (×3): qty 1

## 2020-05-30 MED ORDER — ACETAMINOPHEN 10 MG/ML IV SOLN
INTRAVENOUS | Status: AC
  Filled 2020-05-30: qty 100

## 2020-05-30 MED ORDER — TETANUS-DIPHTH-ACELL PERTUSSIS 5-2.5-18.5 LF-MCG/0.5 IM SUSY: 0.5000 mL | PREFILLED_SYRINGE | Freq: Once | INTRAMUSCULAR | Status: DC

## 2020-05-30 MED ORDER — SCOPOLAMINE 1 MG/3DAYS TD PT72
MEDICATED_PATCH | TRANSDERMAL | Status: AC
  Filled 2020-05-30: qty 1

## 2020-05-30 MED ORDER — MORPHINE SULFATE (PF) 0.5 MG/ML IJ SOLN
INTRAMUSCULAR | Status: AC
  Filled 2020-05-30: qty 10

## 2020-05-30 MED ORDER — ZOLPIDEM TARTRATE 5 MG PO TABS: 5.0000 mg | ORAL_TABLET | Freq: Every evening | ORAL | Status: DC | PRN

## 2020-05-30 MED ORDER — FENTANYL CITRATE (PF) 100 MCG/2ML IJ SOLN
INTRAMUSCULAR | Status: DC | PRN
  Administered 2020-05-30: 100 ug via EPIDURAL

## 2020-05-30 MED ORDER — METFORMIN HCL ER 750 MG PO TB24
750.0000 mg | ORAL_TABLET | Freq: Every day | ORAL | Status: DC
  Administered 2020-05-30 – 2020-06-02 (×4): 750 mg via ORAL
  Filled 2020-05-30 (×4): qty 1

## 2020-05-30 MED ORDER — SENNOSIDES-DOCUSATE SODIUM 8.6-50 MG PO TABS
2.0000 | ORAL_TABLET | Freq: Every day | ORAL | Status: DC
  Administered 2020-05-31 – 2020-06-01 (×2): 2 via ORAL
  Filled 2020-05-30 (×2): qty 2

## 2020-05-30 MED ORDER — NALOXONE HCL 4 MG/10ML IJ SOLN
1.0000 ug/kg/h | INTRAVENOUS | Status: DC | PRN
  Filled 2020-05-30: qty 5

## 2020-05-30 MED ORDER — SIMETHICONE 80 MG PO CHEW
80.0000 mg | CHEWABLE_TABLET | ORAL | Status: DC | PRN
Start: 2020-05-30 — End: 2020-06-02

## 2020-05-30 MED ORDER — DIPHENHYDRAMINE HCL 25 MG PO CAPS: 25.0000 mg | ORAL_CAPSULE | Freq: Four times a day (QID) | ORAL | Status: DC | PRN

## 2020-05-30 MED ORDER — OXYCODONE HCL 5 MG PO TABS: 5.0000 mg | ORAL_TABLET | Freq: Once | ORAL | Status: DC | PRN

## 2020-05-30 MED ORDER — AMISULPRIDE (ANTIEMETIC) 5 MG/2ML IV SOLN: 10.0000 mg | Freq: Once | INTRAVENOUS | Status: DC | PRN

## 2020-05-30 MED ORDER — NALBUPHINE HCL 10 MG/ML IJ SOLN: 5.0000 mg | Freq: Once | INTRAMUSCULAR | Status: DC | PRN

## 2020-05-30 MED ORDER — DIPHENHYDRAMINE HCL 25 MG PO CAPS
25.0000 mg | ORAL_CAPSULE | ORAL | Status: DC | PRN
Start: 2020-05-30 — End: 2020-06-02

## 2020-05-30 MED ORDER — OXYCODONE HCL 5 MG/5ML PO SOLN: 5.0000 mg | Freq: Once | ORAL | Status: DC | PRN

## 2020-05-30 MED ORDER — PROMETHAZINE HCL 25 MG/ML IJ SOLN: 6.2500 mg | INTRAMUSCULAR | Status: DC | PRN

## 2020-05-30 MED ORDER — MENTHOL 3 MG MT LOZG: 1.0000 | LOZENGE | OROMUCOSAL | Status: DC | PRN

## 2020-05-30 MED ORDER — CEFAZOLIN IN SODIUM CHLORIDE 3-0.9 GM/100ML-% IV SOLN
INTRAVENOUS | Status: AC
  Filled 2020-05-30: qty 100

## 2020-05-30 MED ORDER — ONDANSETRON HCL 4 MG/2ML IJ SOLN
INTRAMUSCULAR | Status: AC
  Filled 2020-05-30: qty 2

## 2020-05-30 MED ORDER — COCONUT OIL OIL: 1.0000 "application " | TOPICAL_OIL | Status: DC | PRN

## 2020-05-30 MED ORDER — ONDANSETRON HCL 4 MG/2ML IJ SOLN
INTRAMUSCULAR | Status: DC | PRN
  Administered 2020-05-30: 4 mg via INTRAVENOUS

## 2020-05-30 MED ORDER — CEFAZOLIN IN SODIUM CHLORIDE 3-0.9 GM/100ML-% IV SOLN
3.0000 g | INTRAVENOUS | Status: AC
  Administered 2020-05-30: 3 g via INTRAVENOUS

## 2020-05-30 MED ORDER — OXYTOCIN-SODIUM CHLORIDE 30-0.9 UT/500ML-% IV SOLN
INTRAVENOUS | Status: DC | PRN
  Administered 2020-05-30: 30 [IU] via INTRAVENOUS

## 2020-05-30 MED ORDER — LIDOCAINE-EPINEPHRINE (PF) 2 %-1:200000 IJ SOLN
INTRAMUSCULAR | Status: DC | PRN
  Administered 2020-05-30 (×4): 5 mL via EPIDURAL

## 2020-05-30 MED ORDER — ONDANSETRON HCL 4 MG/2ML IJ SOLN: 4.0000 mg | Freq: Three times a day (TID) | INTRAMUSCULAR | Status: DC | PRN

## 2020-05-30 MED ORDER — ACETAMINOPHEN 10 MG/ML IV SOLN
1000.0000 mg | Freq: Once | INTRAVENOUS | Status: DC | PRN
  Administered 2020-05-30: 1000 mg via INTRAVENOUS

## 2020-05-30 MED ORDER — OXYCODONE HCL 5 MG PO TABS
5.0000 mg | ORAL_TABLET | Freq: Four times a day (QID) | ORAL | Status: DC | PRN
  Administered 2020-05-30 – 2020-06-02 (×8): 5 mg via ORAL
  Filled 2020-05-30 (×9): qty 1

## 2020-05-30 MED ORDER — POVIDONE-IODINE 10 % EX SWAB: 2.0000 "application " | Freq: Once | CUTANEOUS | Status: DC

## 2020-05-30 MED ORDER — DIBUCAINE (PERIANAL) 1 % EX OINT: 1.0000 "application " | TOPICAL_OINTMENT | CUTANEOUS | Status: DC | PRN

## 2020-05-30 MED ORDER — SODIUM CHLORIDE 0.9% FLUSH: 3.0000 mL | INTRAVENOUS | Status: DC | PRN

## 2020-05-30 MED ORDER — MEPERIDINE HCL 25 MG/ML IJ SOLN: 6.2500 mg | INTRAMUSCULAR | Status: DC | PRN

## 2020-05-30 MED ORDER — FENTANYL CITRATE (PF) 100 MCG/2ML IJ SOLN
INTRAMUSCULAR | Status: AC
  Filled 2020-05-30: qty 2

## 2020-05-30 MED ORDER — PRENATAL MULTIVITAMIN CH
1.0000 | ORAL_TABLET | Freq: Every day | ORAL | Status: DC
  Administered 2020-05-30 – 2020-06-02 (×4): 1 via ORAL
  Filled 2020-05-30 (×4): qty 1

## 2020-05-30 MED ORDER — OXYTOCIN-SODIUM CHLORIDE 30-0.9 UT/500ML-% IV SOLN: 2.5000 [IU]/h | INTRAVENOUS | Status: AC

## 2020-05-30 MED ORDER — SCOPOLAMINE 1 MG/3DAYS TD PT72
1.0000 | MEDICATED_PATCH | Freq: Once | TRANSDERMAL | Status: AC
  Administered 2020-05-30: 1.5 mg via TRANSDERMAL

## 2020-05-30 MED ORDER — PANTOPRAZOLE SODIUM 40 MG PO TBEC
40.0000 mg | DELAYED_RELEASE_TABLET | Freq: Every day | ORAL | Status: DC
  Administered 2020-05-30 – 2020-06-02 (×4): 40 mg via ORAL
  Filled 2020-05-30 (×4): qty 1

## 2020-05-30 MED ORDER — DIPHENHYDRAMINE HCL 50 MG/ML IJ SOLN: 12.5000 mg | Freq: Four times a day (QID) | INTRAMUSCULAR | Status: DC | PRN

## 2020-05-30 MED ORDER — KETOROLAC TROMETHAMINE 30 MG/ML IJ SOLN
30.0000 mg | Freq: Four times a day (QID) | INTRAMUSCULAR | Status: AC
  Administered 2020-05-30 – 2020-05-31 (×4): 30 mg via INTRAVENOUS
  Filled 2020-05-30 (×4): qty 1

## 2020-05-30 MED ORDER — STERILE WATER FOR IRRIGATION IR SOLN
Status: DC | PRN
  Administered 2020-05-30: 1000 mL

## 2020-05-30 MED ORDER — LORATADINE 10 MG PO TABS
10.0000 mg | ORAL_TABLET | Freq: Every day | ORAL | Status: DC
  Administered 2020-05-30 – 2020-06-02 (×4): 10 mg via ORAL
  Filled 2020-05-30 (×4): qty 1

## 2020-05-30 MED ORDER — FENTANYL CITRATE (PF) 100 MCG/2ML IJ SOLN
25.0000 ug | INTRAMUSCULAR | Status: DC | PRN
  Administered 2020-05-30 (×2): 50 ug via INTRAVENOUS

## 2020-05-30 MED ORDER — LACTATED RINGERS IV SOLN: INTRAVENOUS | Status: DC | PRN

## 2020-05-30 MED ORDER — SODIUM CHLORIDE 0.9 % IR SOLN
Status: DC | PRN
  Administered 2020-05-30: 1000 mL

## 2020-05-30 MED ORDER — LABETALOL HCL 200 MG PO TABS
200.0000 mg | ORAL_TABLET | Freq: Three times a day (TID) | ORAL | Status: DC
  Administered 2020-05-30 – 2020-06-02 (×9): 200 mg via ORAL
  Filled 2020-05-30 (×9): qty 1

## 2020-05-30 MED ORDER — SIMETHICONE 80 MG PO CHEW
80.0000 mg | CHEWABLE_TABLET | Freq: Three times a day (TID) | ORAL | Status: DC
  Administered 2020-05-30 – 2020-06-02 (×9): 80 mg via ORAL
  Filled 2020-05-30 (×9): qty 1

## 2020-05-30 SURGICAL SUPPLY — 40 items
APL SKNCLS STERI-STRIP NONHPOA (GAUZE/BANDAGES/DRESSINGS)
BARRIER ADHS 3X4 INTERCEED (GAUZE/BANDAGES/DRESSINGS) ×2 IMPLANT
BENZOIN TINCTURE PRP APPL 2/3 (GAUZE/BANDAGES/DRESSINGS) IMPLANT
BRR ADH 4X3 ABS CNTRL BYND (GAUZE/BANDAGES/DRESSINGS) ×1
CHLORAPREP W/TINT 26ML (MISCELLANEOUS) ×2 IMPLANT
CLAMP CORD UMBIL (MISCELLANEOUS) IMPLANT
CLOTH BEACON ORANGE TIMEOUT ST (SAFETY) ×2 IMPLANT
DRESSING PREVENA PLUS CUSTOM (GAUZE/BANDAGES/DRESSINGS) ×1 IMPLANT
DRSG OPSITE POSTOP 4X10 (GAUZE/BANDAGES/DRESSINGS) ×2 IMPLANT
DRSG PREVENA PLUS CUSTOM (GAUZE/BANDAGES/DRESSINGS) ×2
ELECT REM PT RETURN 9FT ADLT (ELECTROSURGICAL) ×2
ELECTRODE REM PT RTRN 9FT ADLT (ELECTROSURGICAL) ×1 IMPLANT
EXTRACTOR VACUUM KIWI (MISCELLANEOUS) IMPLANT
EXTRACTOR VACUUM M CUP 4 TUBE (SUCTIONS) IMPLANT
GLOVE BIO SURGEON STRL SZ7 (GLOVE) ×2 IMPLANT
GLOVE BIOGEL PI IND STRL 7.0 (GLOVE) ×2 IMPLANT
GLOVE BIOGEL PI INDICATOR 7.0 (GLOVE) ×2
GOWN STRL REUS W/TWL LRG LVL3 (GOWN DISPOSABLE) ×4 IMPLANT
KIT ABG SYR 3ML LUER SLIP (SYRINGE) IMPLANT
NEEDLE HYPO 25X5/8 SAFETYGLIDE (NEEDLE) IMPLANT
NS IRRIG 1000ML POUR BTL (IV SOLUTION) ×2 IMPLANT
PACK C SECTION WH (CUSTOM PROCEDURE TRAY) ×2 IMPLANT
PAD OB MATERNITY 4.3X12.25 (PERSONAL CARE ITEMS) ×2 IMPLANT
RTRCTR C-SECT PINK 25CM LRG (MISCELLANEOUS) IMPLANT
SPONGE LAP 18X18 RF (DISPOSABLE) ×14 IMPLANT
STRIP CLOSURE SKIN 1/2X4 (GAUZE/BANDAGES/DRESSINGS) IMPLANT
SUT MNCRL 0 VIOLET CTX 36 (SUTURE) ×3 IMPLANT
SUT MONOCRYL 0 CTX 36 (SUTURE) ×6
SUT PLAIN 0 NONE (SUTURE) IMPLANT
SUT PLAIN 2 0 (SUTURE)
SUT PLAIN ABS 2-0 CT1 27XMFL (SUTURE) IMPLANT
SUT VIC AB 0 CT1 27 (SUTURE) ×4
SUT VIC AB 0 CT1 27XBRD ANBCTR (SUTURE) ×2 IMPLANT
SUT VIC AB 2-0 CT1 27 (SUTURE) ×2
SUT VIC AB 2-0 CT1 TAPERPNT 27 (SUTURE) ×1 IMPLANT
SUT VIC AB 4-0 KS 27 (SUTURE) ×2 IMPLANT
SUT VICRYL 0 TIES 12 18 (SUTURE) IMPLANT
TOWEL OR 17X24 6PK STRL BLUE (TOWEL DISPOSABLE) ×2 IMPLANT
TRAY FOLEY W/BAG SLVR 14FR LF (SET/KITS/TRAYS/PACK) IMPLANT
WATER STERILE IRR 1000ML POUR (IV SOLUTION) ×2 IMPLANT

## 2020-05-30 NOTE — Op Note (Signed)
Cesarean Section Procedure Note   Katie [name]  05/30/2020 Procedure: Primary Low Transverse Cesarean section.                     (2 layer closure)  Indications: Arrest of dilatation at 8.5 cm for 6 hours. High fetal head at -3.    Pre-operative Diagnosis: Arrest of dilatation. DM II, Chronic hypertension, Maternal obesity  Post-operative Diagnosis: Same   Surgeon: Shea Evans, MD   Assistants: Arlan Organ, CNM   Anesthesia: epidural   Procedure Details:  The patient was seen in the Holding Room. The risks, benefits, complications, treatment options, and expected outcomes were discussed with the patient. The patient concurred with the proposed plan, giving informed consent. identified as Katie [name] and the procedure verified as C-Section Delivery. A Time Out was held and the above information confirmed. 3 gm Ancef given. Track-see placed for pannus retraction. After induction of anesthesia, the patient was draped and prepped in the usual sterile manner, foley was draining urine well.  A pfannenstiel incision was made and carried down through the subcutaneous tissue to the fascia that was significantly thick. Fascial incision was made and extended transversely. The fascia was separated from the underlying rectus tissue superiorly and inferiorly. The peritoneum was identified and entered. Peritoneal incision was extended longitudinally. Alexis-O retractor placed. The utero-vesical peritoneal reflection was incised transversely and the bladder flap was bluntly freed from the lower uterine segment. A low transverse uterine incision was made. Delivered from cephalic presentation was a FEMALE infant with vigorous cry. Apgar scores of 9 at one minute and 10 at five minutes. Delayed cord clamping done at 1 minute and baby handed to NICU team in attendance. Cord ph was not sent. Cord blood was obtained for evaluation. The placenta was removed Intact and appeared normal. The  uterine outline, tubes and ovaries appeared normal}. The uterine incision was closed with running locked sutures of . A second imbricating layer sutured.  Hemostasis was observed. Alexis retractor removed. Peritoneal closure done with 2-0 Vicryl.  The fascia was then reapproximated with running sutures of 0Vicryl. The subcuticular closure was performed using 2-0plain gut. The skin was closed with 4-0Vicryl. Pravena dressing/ wound vac placed.  Instrument, sponge, and needle counts were correct prior the abdominal closure and were correct at the conclusion of the case.   Findings: Female infant delivered via [facility] hysterotomy at 8.26 AM on 05/30/20, Apgars 9 and 10. Light meconium stained fluid.    Estimated Blood Loss: 1084 cc   Total IV Fluids: 2800 cc LR  Urine Output: 150CC OF clear urine at the end of surgery  Specimens: cord blood, placenta  Complications: no complications  Disposition: PACU - hemodynamically stable.   Maternal Condition: stable   Baby condition / location:  Couplet care / Skin to Skin  Attending Attestation: I performed the procedure.   Signed: Surgeon(s): Shea Evans, MD

## 2020-05-30 NOTE — Progress Notes (Signed)
Entered the room to do the patients 1st 4hr check with fundal rub and orthostatic vitals. Patient refused orthos and to stand at this time. Educated patient on benefits of early ambulation. Patient declined until her 2nd 4hr check.

## 2020-05-30 NOTE — Progress Notes (Signed)
Subjective: Doing well, epidural working. No rectal/ vag pressure.   Objective: BP 121/68   Pulse 89   Temp 98.5 F (36.9 C) (Oral)   Resp 18   Ht 5\' 6"  (1.676 m)   Wt 128.8 kg   LMP 08/30/2019 (Exact Date)   SpO2 98%   BMI 45.83 kg/m   FHT: 150s Mod variability, + accels, off and on variable decels with contractions but overall cat I UC: q 3 minutes, with pitocin. Adequate per IUPC SVE:  8.5/ 100%/ -3/ Vx. --> no change since 11 pm last evening ( over 8 hours)   Assessment / Plan: Arrest of dilatation at 8.5 cm for 8.1/2 hours. 39.1 weeks, IOL for DM II and CHTN, suspected macrosomia. Recommend C-section.  Risks/complications of surgery reviewed incl infection, bleeding, damage to internal organs including bladder, bowels, ureters, blood vessels, other risks from anesthesia, VTE and delayed complications of any surgery, complications in future surgery reviewed. Also discussed neonatal complications incl difficult delivery, laceration, vacuum assistance, TTN etc. Pt understands and agrees, all concerns addressed.   Patient declines permanent sterilization in case C-section was needed.   09/01/2019 05/30/2020, 7:35 AM

## 2020-05-30 NOTE — Transfer of Care (Signed)
Immediate Anesthesia Transfer of Care Note  Patient: Katie [name]  Procedure(s) Performed: CESAREAN SECTION (N/A )  Patient Location: PACU  Anesthesia Type:Epidural  Level of Consciousness: awake  Airway & Oxygen Therapy: Patient Spontanous Breathing  Post-op Assessment: Report given to RN and Post -op Vital signs reviewed and stable  Post vital signs: Reviewed and stable  Last Vitals:  Vitals Value Taken Time  BP 129/71 05/30/20 0939  Temp    Pulse 89 05/30/20 0942  Resp 19 05/30/20 0942  SpO2 100 % 05/30/20 0942  Vitals shown include unvalidated device data.  Last Pain:  Vitals:   05/30/20 0732  TempSrc: Oral  PainSc: 0-No pain         Complications: No complications documented.

## 2020-05-30 NOTE — Progress Notes (Signed)
Dr. Bradley Ferris stated repeat CBC was not needed to pull out epidural.  PLT on 5/17 is 210

## 2020-05-30 NOTE — Lactation Note (Signed)
This note was copied from a baby's chart. Lactation Consultation Note  Patient Name: Katie [name] [id number] Date: 05/30/2020 Reason for consult: Initial assessment;Term;Other (Comment);Maternal endocrine disorder (LGA infant greater than 9 lbs, mom right breast ( only) is flat mom was given shells, hand pump and 24 mm NS, for right breast whereas her left is evert and infant latches well on her left breast. C/S delivery) Age:37 hours P2, term female infant, LGA. Infant had two voids and two stools, MGM changed one void and stool diaper while LC was in the room. Mom attempted to latch infant on her right breast, infant was off and on not sustaining latch at first, mom fitted with 24 mm NS, infant breastfeed for 26 minutes. Mom used NS with her daughter on her right breast in the beginning. LC discussed infant's  input and output with mom. Mom made aware of O/P services, breastfeeding support groups, community resources, and our phone # for post-discharge questions.  Mom is active on the [facility] Program and have DEBP at home. Mom's Plan: 1- Continue to BF infant according to cues, 8 to 12+ times within 24 hours, STS. 2-Prior to latching infant at the breast, right nipple only, mom will pre-pump breast with hand pump and then apply 24 mm NS. 3- Mom knows to call RN or LC if she needs further assistance with latching infant at the breast.  Maternal Data Has patient been taught Hand Expression?: Yes Does the patient have breastfeeding experience prior to this delivery?: Yes How long did the patient breastfeed?: Per mom, she BF her 59 year old daughter for 6 months and continue to give her pumped breastmilk unil she was [age] year of age.  Feeding Mother's Current Feeding Choice: Breast Milk  LATCH Score Latch: Repeated attempts needed to sustain latch, nipple held in mouth throughout feeding, stimulation needed to elicit sucking reflex.  Audible Swallowing: A few with stimulation  Type of  Nipple: Everted at rest and after stimulation (Right breast is short shafted only and becomes flat when stimulated or touched.)  Comfort (Breast/Nipple): Soft / non-tender  Hold (Positioning): Assistance needed to correctly position infant at breast and maintain latch.  LATCH Score: 7   Lactation Tools Discussed/Used Tools: Shells;Pump Breast pump type: Manual Pumping frequency: Pre-pump to help evert right breast nipple shaft out to help with infant latch.  Interventions Interventions: Breast feeding basics reviewed;Assisted with latch;Skin to skin;Breast massage;Pre-pump if needed;Breast compression;Adjust position;Support pillows;Position options;Expressed milk;Shells;Hand pump;Education  Discharge Pump: Manual  Consult Status Consult Status: Follow-up Date: 05/31/20 Follow-up type: In-patient    Danelle Earthly 05/30/2020, 4:53 PM

## 2020-05-30 NOTE — Progress Notes (Signed)
FHT assessed, 150s, minimal variability, return of repetitive variable decels with contractions. Off and on accels noted- cat II  Toco q 4-5 minutes, pitocin.  SVD deferred., recheck at 6.30 am

## 2020-05-30 NOTE — Progress Notes (Signed)
Subjective: Doing well, epidural working. No rectal/ vag pressure. HA better since 2nd Fiorecet   Objective: BP (!) 143/75   Pulse 86   Temp 98.4 F (36.9 C)   Resp 20   Ht 5\' 6"  (1.676 m)   Wt 128.8 kg   LMP 08/30/2019 (Exact Date)   SpO2 98%   BMI 45.83 kg/m   FHT: 150s Mod variability, + accels, no decels (decels resolved after pitocin was stopped entirely, tapered from 18 to 10 and then stopped) UC: tapered off since stopping pitocin in last 1 hr   SVE:  8-9/ 100%/ -3/ Vx. IUPC placed.   Assessment / Plan: 35 yo G2P1001, 39.1 weeks, IOL for DM II and CHTN. Macrosomia, shoulder dystocia risk and plan d/w pt and RN Station remains at inlet.  Need to restart pitocin and assess if baby tolerates labor, IUPC placed. C/section risk d/w pt - for either arrest of dilatation or fetal intolerance to labor.   GBS (+), PCN per protocol  DM II - continue q 2 hr CBGs  CHTN- took AM Labetalol 200mg , holding additional doses since BP mostly 120s/ 80s except last few being higher but none in severe range.   Anticipated MOD:  Guarded, working towards SVD  Patient declines permanent sterilization in case C-section was needed.   31 05/30/2020, 4:50 AM

## 2020-05-30 NOTE — Lactation Note (Signed)
This note was copied from a baby's chart. Lactation Consultation Note  Patient Name: Katie [name] [id number] Date: 05/30/2020 Reason for consult: Mother's request;Follow-up assessment Age:37 hours  Mom current feeding plan is breastfeeding and supplementing infant with donor breast milk. Mom is using 24 mm on right breast due to it being flat and she has manual hand pump to pre-pump breast prior to latching infant.  Mom latched infant on her left breast infant not sustaining latch and fussy. Mom requested donor breast milk to supplement infant with breastfeeding. Mom latched infant on her left breast and infant was supplemented with 10 mls of donor breast milk  at the breast using a  curve tip syringe and infant BF for 10 minutes. LC provided full  assistance  in holding infant at the breast due to mom being very tired and sleepy. Mom will continue to BF infant according to cues , 8 to 12 + times within 24 hours, STS and will call RN or LC for breastfeeding assistance if needed.  Maternal Data    Feeding Mother's Current Feeding Choice: Breast Milk and Donor Milk  LATCH Score Latch: Grasps breast easily, tongue down, lips flanged, rhythmical sucking.  Audible Swallowing: Spontaneous and intermittent  Type of Nipple: Flat  Comfort (Breast/Nipple): Soft / non-tender  Hold (Positioning): Full assist, staff holds infant at breast (Mom very  tired and sleepy and not holding infant in football hold postion or supporting breast.)  LATCH Score: 7   Lactation Tools Discussed/Used Tools: Nipple Shields Nipple shield size: 24  Interventions    Discharge    Consult Status Consult Status: Follow-up Date: 05/31/20 Follow-up type: In-patient    Danelle Earthly 05/30/2020, 11:21 PM

## 2020-05-30 NOTE — Anesthesia Postprocedure Evaluation (Signed)
Anesthesia Post Note  Patient: Katie [name]  Procedure(s) Performed: CESAREAN SECTION (N/A )     Patient location during evaluation: PACU Anesthesia Type: Epidural Level of consciousness: awake Pain management: pain level controlled Vital Signs Assessment: post-procedure vital signs reviewed and stable Respiratory status: spontaneous breathing, nonlabored ventilation and respiratory function stable Cardiovascular status: stable Postop Assessment: no headache, no backache and epidural receding Anesthetic complications: no   No complications documented.  Last Vitals:  Vitals:   05/30/20 1425 05/30/20 1536  BP:    Pulse: 82 86  Resp: 16 16  Temp:    SpO2: 100% 100%    Last Pain:  Vitals:   05/30/20 1425  TempSrc:   PainSc: 0-No pain   Pain Goal:                   Catheryn Bacon Ahtziri Jeffries

## 2020-05-31 DIAGNOSIS — D62 Acute posthemorrhagic anemia: Secondary | ICD-10-CM | POA: Diagnosis not present

## 2020-05-31 LAB — GLUCOSE, CAPILLARY
Glucose-Capillary: 113 mg/dL — ABNORMAL HIGH (ref 70–99)
Glucose-Capillary: 118 mg/dL — ABNORMAL HIGH (ref 70–99)
Glucose-Capillary: 123 mg/dL — ABNORMAL HIGH (ref 70–99)
Glucose-Capillary: 127 mg/dL — ABNORMAL HIGH (ref 70–99)
Glucose-Capillary: 130 mg/dL — ABNORMAL HIGH (ref 70–99)
Glucose-Capillary: 177 mg/dL — ABNORMAL HIGH (ref 70–99)

## 2020-05-31 LAB — CBC
HCT: 25.6 % — ABNORMAL LOW (ref 36.0–46.0)
Hemoglobin: 8.3 g/dL — ABNORMAL LOW (ref 12.0–15.0)
MCH: 32 pg (ref 26.0–34.0)
MCHC: 32.4 g/dL (ref 30.0–36.0)
MCV: 98.8 fL (ref 80.0–100.0)
Platelets: 134 10*3/uL — ABNORMAL LOW (ref 150–400)
RBC: 2.59 MIL/uL — ABNORMAL LOW (ref 3.87–5.11)
RDW: 14.2 % (ref 11.5–15.5)
WBC: 6.5 10*3/uL (ref 4.0–10.5)
nRBC: 0 % (ref 0.0–0.2)

## 2020-05-31 LAB — SURGICAL PATHOLOGY

## 2020-05-31 MED ORDER — SODIUM CHLORIDE 0.9 % IV SOLN
500.0000 mg | Freq: Once | INTRAVENOUS | Status: AC
  Administered 2020-05-31: 500 mg via INTRAVENOUS
  Filled 2020-05-31: qty 25

## 2020-05-31 MED ORDER — LACTATED RINGERS IV BOLUS
300.0000 mL | Freq: Once | INTRAVENOUS | Status: AC
  Administered 2020-05-31: 300 mL via INTRAVENOUS

## 2020-05-31 MED ORDER — POLYSACCHARIDE IRON COMPLEX 150 MG PO CAPS
150.0000 mg | ORAL_CAPSULE | Freq: Two times a day (BID) | ORAL | Status: DC
  Administered 2020-06-01 – 2020-06-02 (×3): 150 mg via ORAL
  Filled 2020-05-31 (×3): qty 1

## 2020-05-31 MED ORDER — MAGNESIUM OXIDE -MG SUPPLEMENT 400 (240 MG) MG PO TABS
400.0000 mg | ORAL_TABLET | Freq: Every day | ORAL | Status: DC
  Administered 2020-06-01 – 2020-06-02 (×2): 400 mg via ORAL
  Filled 2020-05-31 (×2): qty 1

## 2020-05-31 NOTE — Social Work (Signed)
CSW received consult for hx of Anxiety, Depression and PTSD.  CSW met with MOB to offer support and complete assessment.     CSW introduced self and role. CSW observed MGM Aminah bedside and MOB breast feeding infant. MOB declined to have CSW return for assessment and provided permission for [facility] to remain present. CSW informed MOB of reason for consult and assessed current emotions. MOB was engaged and open with CSW. MOB shared she is currently feeling okay. MOB disclosed she experienced some medical challenges during the pregnancy, however it was good overall. MOB reported she has a diagnosis of chronic depression and anxiety. CSW notes a diagnosis of borderline personality disorder in the chart, which was not acknowledged by MOB. MOB stated she was first diagnosed about 10 years ago. MOB reported she used to take Valium and now takes Buspar 57m, which she feels is helpful. MOB shared she tried therapy in the past year and did not find it to be helpful. MOB stated her mother and father are her primary supports. MOB declined to acknowledge FOB. MOB denies any current SI or HI. MOB was observed appropriately interacting with infant throughout assessment and did not display any acute mental health symptoms.  MOB stated she lives with her 147yo daughter (Katie [name]. [facility] CSW informed MOB she can contact DSS to have infant added to benefits.   CSW provided education regarding the baby blues period versus PPD and provided resources. CSW provided the New Mom Checklist and encouraged MOB to self evaluate and contact a medical professional if symptoms are noted at any time.  CSW provided review of Sudden Infant Death Syndrome (SIDS) precautions. MOB stated she has everything needed for infant. MOB [facility] and denies barriers. MOB denies any additional needs at this time.     CSW identifies no further need for intervention  and no barriers to discharge at this time.  CDarra Lis LDewey-HumboldtWork WEnterprise Productsand CMolson [employer]([phone]

## 2020-05-31 NOTE — Progress Notes (Addendum)
Subjective: POD# 1 Live born female  Birth Weight: 9 lb 5.7 oz (4245 g) APGAR: 9, 10  Newborn Delivery   Birth date/time: 05/[birth year]/2020 08:26:00 Delivery type: C-Section, Low Transverse Trial of labor: Yes C-section categorization: Primary     Baby name: undecided  Delivering provider: MODY, VAISHALI   circumcision yes. Planning.  Feeding: breast and planning to supplement with donor milk.   Pain control at delivery: Epidural   Reports feeling sore and uncomfortable with movement. Getting up and moving around the room well. Expressed some concern for difficulty with latching. Mother of pt at bedside.   Patient reports tolerating PO.   Breast symptoms: none.  Pain controlled with IV Toradol this AM, and PO tylenol Denies HA/SOB/C/P/N/V/dizziness. Flatus absent. She reports vaginal bleeding as normal, without clots.  She is ambulating, urinating without difficulty.     Objective:   VS:    Vitals:   05/30/20 2041 05/31/20 0038 05/31/20 0438 05/31/20 0940  BP:  115/68 (!) 106/54 124/82  Pulse:  91 96 92  Resp: 16 18 18 18   Temp: 98 F (36.7 C) 97.7 F (36.5 C) 97.9 F (36.6 C) 98.2 F (36.8 C)  TempSrc: Oral Oral Oral Oral  SpO2: 100% 98% 98% 100%  Weight:      Height:          Intake/Output Summary (Last 24 hours) at 05/31/2020 1111 Last data filed at 05/31/2020 0430 Gross per 24 hour  Intake 720 ml  Output 1150 ml  Net -430 ml        Recent Labs    05/29/20 1957 05/31/20 0549  WBC 8.3 6.5  HGB 11.3* 8.3*  HCT 36.0 25.6*  PLT 210 134*     Blood type: --/--/O POS (05/17 1147)  Rubella:   immune Vaccines: TDaP          UTD         Flu             UTD                    COVID-19 UTD   Physical Exam:  General: alert, cooperative, no distress and pale CV: Regular rate and rhythm S1,S2 present Resp: clear and unlabored  Abdomen: soft, bowel sounds present, hypoactive in lower quadrants.  Incision: Covered by negative pressure wound dressing. clean, dry,  intact and no drainage present.  Uterine Fundus: firm, below umbilicus, tender to palpation  Lochia: minimal, no clots  Ext: moderate edema present, no pain or erythema noted. No sx of DVT.       Assessment/Plan: 35 y.o.   POD# 1. 31                  Principal Problem:   Postpartum care following cesarean delivery 5/18  -Continue with routine Post/Op orders. Encouraged frequent ambulation as tolerated for soreness and got motility. Recommended to continue PO Ibuprofen and Tylenol for pain management; PO Oxycodone for PRN. Education provided on normal discomforts of Post/Op surgery. Encouraged to continue to use in [facility] and try to rest as baby rest.  Active Problems:   Borderline personality disorder (HCC)  -Encouraged to seek counseling in PP.    Depression  -Stable status- on 50mg  Zoloft. Previously taken Buspar in pregnancy. States currently coping "okay".  Discussed PP depression versus baby blues. Warning s/s reviewed. Follow up closely in PP for mood stability.   - family dysfunction - social services consult pending   Type  2 diabetes mellitus without complication, without long-term current  use of insulin (HCC)  -CBG this AM 113. Off Lantus postpartum. 750mg  Metformin daily. Contiue to monitor closely.    Hypertension during pregnancy in third trimester  -BPs labile, on labetalol 200mg  TID. No neural symptoms. Continue to monitor closely. May re-evaluate dosage if worsens.    Morbid obesity (HCC)   Encounter for induction of labor   Status post primary low transverse cesarean section FTP 5/18   Delivery by emergency cesarean   Acute blood loss anemia (ABLA)  -AM Hbg 8.4. Decreased urine output overnight. Strict I/O throughout the day. Pt otherwise asymptomatic. Initiate IV Venofer today. Begin PO Nifferex for acute anemia, and Mag oxide for gut motility starting tomorrow.      Katie [name] ) 02-23-2000, BSN, RNC-OB  Student Nurse-Midwife   05/31/2020  11:11 AM    Medical screening examination/treatment/procedure(s) were conducted as a shared visit with non-physician practitioner(s) and myself.  I personally evaluated the patient during the encounter.   Katie [name], CNM, MSN 05/31/2020, 3:17 PM

## 2020-06-01 LAB — GLUCOSE, CAPILLARY
Glucose-Capillary: 118 mg/dL — ABNORMAL HIGH (ref 70–99)
Glucose-Capillary: 133 mg/dL — ABNORMAL HIGH (ref 70–99)
Glucose-Capillary: 89 mg/dL (ref 70–99)
Glucose-Capillary: 139 mg/dL — ABNORMAL HIGH (ref 70–99)
Glucose-Capillary: 96 mg/dL (ref 70–99)
Glucose-Capillary: 144 mg/dL — ABNORMAL HIGH (ref 70–99)

## 2020-06-01 MED ORDER — HYDROCHLOROTHIAZIDE 12.5 MG PO CAPS
12.5000 mg | ORAL_CAPSULE | Freq: Every day | ORAL | Status: DC
  Administered 2020-06-01 – 2020-06-02 (×2): 12.5 mg via ORAL
  Filled 2020-06-01 (×3): qty 1

## 2020-06-01 NOTE — Lactation Note (Addendum)
This note was copied from a baby's chart. Lactation Consultation Note  Patient Name: Boy Trinisha Paget IOEVO'J Date: 06/01/2020 Reason for consult: Follow-up assessment;Term;Maternal endocrine disorder (-5% weight loss, LGA infant > 9 lbs at birth.) Age:37 hours, term female infant. Mom is still working on latching infant at he breast ,she is using 24 mm NS when latching infant. Per MGM, infant is consuming 20 mls of EBM  per feeding using a slow flow bottle nipple. Per mom, she pumping only one breast and then the other one afterwards using her Spectra DEBP from home. LC  Suggested she pump both breast at the same time and give him,   back any volume of EBM first before offering donor breast milk.   Maternal Data    Feeding Mother's Current Feeding Choice: Breast Milk and Donor Milk Nipple Type: Slow - flow  LATCH Score                    Lactation Tools Discussed/Used    Interventions    Discharge Pump: DEBP  Consult Status Consult Status: Follow-up Date: 06/01/20 Follow-up type: In-patient    Danelle Earthly 06/01/2020, 12:31 AM

## 2020-06-01 NOTE — Social Work (Signed)
CSW acknowledges consult for MOB scoring 13 on Edinburgh. CSW has completed assessment with MOB.  CSW provided MOB with necessary mental health resources, as well as education on PPD.  CSW identifies no further need for intervention and no barriers to discharge at this time.  Allie Gerhold, LCSWA Clinical Social Work [facility] [phone]    

## 2020-06-01 NOTE — Progress Notes (Signed)
Inpatient Diabetes Program Recommendations  AACE/ADA: New Consensus Statement on Inpatient Glycemic Control (2015)  Target Ranges:  Prepandial:   less than 140 mg/dL      Peak postprandial:   less than 180 mg/dL (1-2 hours)      Critically ill patients:  140 - 180 mg/dL   Lab Results  Component Value Date   GLUCAP 139 (H) 06/01/2020   HGBA1C 5.5 05/11/2019    Review of Glycemic Control Results for Katie, [name] (MRN [id number]) as of 06/01/2020 13:57  Ref. Range 06/01/2020 08:40 06/01/2020 12:12  Glucose-Capillary Latest Ref Range: 70 - 99 mg/dL 89 761 (H)   Diabetes history:  DM2 Outpatient Diabetes medications:  Pre-pregnancy-diet controlled Prior to delivery-Semglee 32-34 units daily Metformin 750 mg daily Current orders for Inpatient glycemic control:  Metformin 750 mg daily  Spoke with patient at bedside.  She was diagnosed with type 2 DM last year and lost 70lbs.  Was able to control her DM with diet.  Was placed on insulin during pregnancy.  She checks her blood sugar occasionally.  She has a meter at home.  Agree with current regimen of Metformin 750 mg daily.  Encouraged her to follow up with PCP and have A1C checked at least every 3-6 months.  Encouraged her to check her blood sugar a few times a week if not daily.  Ordered Living Well with DM booklet.  Discussed CHO's and eliminating beverages with sugar.  She states she plans on loosing weight.    Will continue to follow while inpatient.  Thank you, Dulce Sellar, RN, BSN Diabetes Coordinator Inpatient Diabetes Program [phone] (team pager from 8a-5p)

## 2020-06-01 NOTE — Progress Notes (Signed)
Subjective: POD# 2 Live born female  Birth Weight: 9 lb 5.7 oz (4245 g) APGAR: 9, 10  Newborn Delivery   Birth date/time: 05/30/2020 08:26:00 Delivery type: C-Section, Low Transverse Trial of labor: Yes C-section categorization: Primary     Baby name: Katie [name] Delivering provider: MODY, VAISHALI   Circumcision Yes, complete  Feeding: breast and bottle  Pain control at delivery: Epidural   Reports feeling better today. Moderate pain with ambulation. Breastfeeding and supplementing with donor milk and formula. Mother at the bedside providing support.   Patient reports tolerating PO.   Pain controlled with acetaminophen, ibuprofen (OTC) and narcotic analgesics including Oxy IR Denies HA/SOB/C/P/N/V/dizziness. She reports vaginal bleeding as normal, without clots. She is ambulating and urinating without difficulty.     Objective:   Vitals:   05/31/20 1427 05/31/20 1637 05/31/20 2218 06/01/20 0629  BP: 116/63 115/72 118/65 118/65  Pulse: 88  96 89  Resp: 18  18 16   Temp: 98.1 F (36.7 C)  97.9 F (36.6 C) 98.1 F (36.7 C)  TempSrc: Oral  Oral Oral  SpO2: 98%  98% 100%  Weight:      Height:         Intake/Output Summary (Last 24 hours) at 06/01/2020 0913 Last data filed at 06/01/2020 0400 Gross per 24 hour  Intake 960 ml  Output 1600 ml  Net -640 ml      Recent Labs    05/29/20 1957 05/31/20 0549  WBC 8.3 6.5  HGB 11.3* 8.3*  HCT 36.0 25.6*  PLT 210 134*    Blood type: --/--/O POS (05/17 1147)  Rubella:   Immune  Vaccines:   TDaP          UTD                      COVID-19 UTD  Physical Exam:  General: alert and cooperative CV: Regular rate and rhythm Resp: clear Abdomen: soft, nontender, normal bowel sounds Incision: clean, dry and intact , Prevena dressing on and functional Uterine Fundus: firm, below umbilicus, nontender Lochia: minimal Ext: extremities normal, atraumatic, no cyanosis or edema  Assessment/Plan: 35 y.o.   POD# 1. 37                   Principal Problem:   Postpartum care following cesarean delivery 5/18  Encourage rest when baby rests  Breastfeeding support  Encourage to ambulate  Routine post-op care Active Problems:   Borderline personality disorder (HCC)   Depression  Stable on Zoloft 50mg    Type 2 diabetes mellitus without complication, without long-term current use of insulin (HCC)  Diabetic consult ordered  Continue Metformin 750mg    Hypertension during pregnancy in third trimester  BPs stable  Continue Labetalol 200mg  TID  Close PP F/U for BP   Morbid obesity (HCC)   Encounter for induction of labor   Status post primary low transverse cesarean section FTP 5/18   Delivery by emergency cesarean   Acute blood loss anemia (ABLA)  Asymptomatic  S/P IV Venofer yesterday  Start PO Niferex and mag oxide today  Anticipate discharge tomorrow.             , CNM, MSN 06/01/2020, 9:13 AM

## 2020-06-02 LAB — GLUCOSE, CAPILLARY
Glucose-Capillary: 100 mg/dL — ABNORMAL HIGH (ref 70–99)
Glucose-Capillary: 121 mg/dL — ABNORMAL HIGH (ref 70–99)
Glucose-Capillary: 94 mg/dL (ref 70–99)

## 2020-06-02 MED ORDER — IBUPROFEN 600 MG PO TABS: 600.0000 mg | ORAL_TABLET | Freq: Four times a day (QID) | ORAL | 0 refills | Status: DC

## 2020-06-02 MED ORDER — ACETAMINOPHEN 500 MG PO TABS: 1000.0000 mg | ORAL_TABLET | Freq: Four times a day (QID) | ORAL | 0 refills | Status: AC

## 2020-06-02 MED ORDER — OXYCODONE HCL 5 MG PO TABS: 5.0000 mg | ORAL_TABLET | Freq: Four times a day (QID) | ORAL | 0 refills | Status: AC | PRN

## 2020-06-02 MED ORDER — POLYSACCHARIDE IRON COMPLEX 150 MG PO CAPS: 150.0000 mg | ORAL_CAPSULE | Freq: Two times a day (BID) | ORAL | 0 refills | Status: DC

## 2020-06-02 NOTE — Lactation Note (Signed)
This note was copied from a baby's chart. Lactation Consultation Note  Patient Name: Boy Darlynn Ricco WCBJS'E Date: 06/02/2020 Reason for consult: Follow-up assessment;Primapara;1st time breastfeeding;Term;Infant weight loss;Other (Comment) (7 % weight loss ,) Age:37 hours  LC reviewed the doc flow sheets and baby has been receiving mostly bottles Of donor milk, and EBM. The most mom has pumped off is 35 ml. Milk is coming in. LC assisted mom latch/ depth achieved and baby fed for short interval and released / off to sleep.  LC Plan :  Work on the latching , and fi baby still hungry after supplement with EBM.  If EBM not available - formula .  Change the nipple to a Dr. Manson Passey newborn ( med base to assist to help baby open wider at the breast / pace feeding.  Post pump  With the DEBP both breast for 10 - 15 mins , save milk for the next feeding.  LC offered mom to request and LC O/P and mom receptive. LC sent a request via Epic. Mom aware she will receive a call .    Maternal Data - per mom has been pumping and milk is coming in.     Feeding Mother's Current Feeding Choice: Breast Milk and Donor Milk Nipple Type: Slow - flow  LATCH Score - Latch score 8 for short interval.                     Lactation Tools Discussed/Used    Interventions Interventions: Breast feeding basics reviewed;Education  Discharge Discharge Education: Engorgement and breast care  Consult Status Consult Status: Complete Date: 06/02/20    Kathrin Greathouse 06/02/2020, 2:36 PM

## 2020-06-02 NOTE — Discharge Summary (Signed)
Postpartum Discharge Summary  Date of Service updated 06/02/20     Patient Name: Katie [name] DOB: 1985-09-18 MRN: [id number]  Date of admission: 05/29/2020 Delivery date:05/30/2020  Delivering provider: MODY, VAISHALI  Date of discharge: 06/02/2020  Admitting diagnosis: Encounter for induction of labor [Z34.90] Delivery by emergency cesarean [O99.892] Intrauterine pregnancy: [redacted]w[redacted]d    Secondary diagnosis:  Principal Problem:   Postpartum care following cesarean delivery 5/18 Active Problems:   Borderline personality disorder ([place]   Depression   Type 2 diabetes mellitus without complication, without long-term current use of insulin ([place]   Hypertension during pregnancy in third trimester   Morbid obesity ([place]   Encounter for induction of labor   Status post primary low transverse cesarean section FTP 5/18   Delivery by emergency cesarean   Acute blood loss anemia (ABLA)  Additional problems: none    Discharge diagnosis: Term Pregnancy Delivered, CHTN, Type 2 DM and Anemia                                              Post partum procedures:n/a Augmentation: Pitocin Complications: None  Hospital course: Induction of Labor With Cesarean Section   35y.o. yo G2P1001 at 349w4das admitted to the hospital 05/29/2020 for induction of labor. Patient had a labor course significant for IOL with Pitocin complicated by NRFHT and arrest of dilation. The patient went for cesarean section due to Arrest of Dilation and Non-Reassuring FHR. Delivery details are as follows: Membrane Rupture Time/Date: 10:55 PM ,05/29/2020   Delivery Method:C-Section, Low Transverse  Details of operation can be found in separate operative Note.  Patient had an uncomplicated postpartum course. She is ambulating, tolerating a regular diet, passing flatus, and urinating well.  Patient is discharged home in stable condition on 06/02/20.      Newborn Data: Birth date:05/30/2020  Birth time:8:26 AM   Gender:Female  Living status:Living  Apgars:9 ,10  Weight:4245 g                                 Magnesium Sulfate received: No BMZ received: No Rhophylac:N/A MMR:N/A  Physical exam  Vitals:   06/01/20 1502 06/01/20 2136 06/01/20 2212 06/02/20 0500  BP: 101/64 122/63 114/66 118/65  Pulse: 93 88 96 79  Resp: '17 16 16 18  ' Temp: 98.3 F (36.8 C) 98.4 F (36.9 C)  98.2 F (36.8 C)  TempSrc: Oral Oral  Oral  SpO2: 99% 100%    Weight:      Height:       General: alert and no distress Lochia: appropriate Uterine Fundus: difficult to palpate due to maternal body habitus Incision: Dressing is clean, dry, and intact, Pravena in place DVT Evaluation: No evidence of DVT seen on physical exam. Calf/Ankle edema is present Labs: Lab Results  Component Value Date   WBC 6.5 05/31/2020   HGB 8.3 (L) 05/31/2020   HCT 25.6 (L) 05/31/2020   MCV 98.8 05/31/2020   PLT 134 (L) 05/31/2020   CMP Latest Ref Rng & Units 05/29/2020  Glucose 70 - 99 mg/dL 168(H)  BUN 6 - 20 mg/dL 10  Creatinine 0.44 - 1.00 mg/dL 0.72  Sodium 135 - 145 mmol/L 137  Potassium 3.5 - 5.1 mmol/L 4.2  Chloride 98 - 111 mmol/L 108  CO2  22 - 32 mmol/L 18(L)  Calcium 8.9 - 10.3 mg/dL 9.2  Total Protein 6.5 - 8.1 g/dL 5.6(L)  Total Bilirubin 0.3 - 1.2 mg/dL 0.4  Alkaline Phos 38 - 126 U/L 48  AST 15 - 41 U/L 18  ALT 0 - 44 U/L 19   Edinburgh Score: Edinburgh Postnatal Depression Scale Screening Tool 05/31/2020  I have been able to laugh and see the funny side of things. 1  I have looked forward with enjoyment to things. 1  I have blamed myself unnecessarily when things went wrong. 2  I have been anxious or worried for no good reason. 2  I have felt scared or panicky for no good reason. 2  Things have been getting on top of me. 1  I have been so unhappy that I have had difficulty sleeping. 2  I have felt sad or miserable. 1  I have been so unhappy that I have been crying. 1  The thought of harming myself has  occurred to me. 0  Edinburgh Postnatal Depression Scale Total 13      After visit meds:  Allergies as of 06/02/2020      Reactions   Flexeril [cyclobenzaprine Hcl] Shortness Of Breath   Lorazepam    Other reaction(s): Other Neck spasm      Medication List    STOP taking these medications   Magnesium 400 MG Caps     TAKE these medications   acetaminophen 500 MG tablet Commonly known as: TYLENOL Take 2 tablets (1,000 mg total) by mouth every 6 (six) hours.   blood glucose meter kit and supplies Kit Use as directed twice a day   cetirizine 10 MG tablet Commonly known as: ZYRTEC Take 10 mg by mouth daily.   Contour Next One Kit Use as directed   Contour Next Test test strip Generic drug: glucose blood Use as instructed   famotidine 20 MG tablet Commonly known as: Pepcid Take 1 tablet (20 mg total) by mouth 2 (two) times daily. Take in morning and at bedtime   ibuprofen 600 MG tablet Commonly known as: ADVIL Take 1 tablet (600 mg total) by mouth every 6 (six) hours.   iron polysaccharides 150 MG capsule Commonly known as: NIFEREX Take 1 capsule (150 mg total) by mouth 2 (two) times daily.   labetalol 100 MG tablet Commonly known as: NORMODYNE Take 200 mg by mouth 3 (three) times daily.   Lancets Misc Use as directed   metFORMIN 750 MG 24 hr tablet Commonly known as: GLUCOPHAGE-XR Take 750 mg by mouth daily.   omeprazole 40 MG capsule Commonly known as: PRILOSEC Take 1 capsule (40 mg total) by mouth daily.   oxyCODONE 5 MG immediate release tablet Commonly known as: Oxy IR/ROXICODONE Take 1 tablet (5 mg total) by mouth every 6 (six) hours as needed for up to 3 days for moderate pain.   sertraline 25 MG tablet Commonly known as: ZOLOFT Take 50 mg by mouth daily.            Discharge Care Instructions  (From admission, onward)         Start     Ordered   06/02/20 0000  Discharge wound care:       Comments: Sitz baths 2 times /day with warm  water x 1 week. May add herbals: 1 ounce dried comfrey leaf* 1 ounce calendula flowers 1 ounce lavender flowers  Supplies can be found online at [employer] sources at [employer]  Shop, [employer]  1/2 ounce dried uva ursi leaves 1/2 ounce witch hazel blossoms (if you can find them) 1/2 ounce dried sage leaf 1/2 cup sea salt Directions: Bring 2 quarts of water to a boil. Turn off heat, and place 1 ounce (approximately 1 large handful) of the above mixed herbs (not the salt) into the pot. Steep, covered, for 30 minutes.  Strain the liquid well with a fine mesh strainer, and discard the herb material. Add 2 quarts of liquid to the tub, along with the 1/2 cup of salt. This medicinal liquid can also be made into compresses and peri-rinses.   06/02/20 1153           Discharge home in stable condition Infant Feeding: Bottle and Breast Infant Disposition:home with mother Discharge instruction: per After Visit Summary and Postpartum booklet. Activity: Advance as tolerated. Pelvic rest for 6 weeks.  Diet: carb modified diet Anticipated Birth Control: Unsure Postpartum Appointment:1 week and 6 weeks Additional Postpartum F/U: Incision check 1 week, BP check 1 week, Blood sugar check 1 week, Depression screening 1 week Future Appointments: Future Appointments  Date Time Provider [place]  06/05/2020  4:20 PM Berniece Salines, DO [facility] None  06/18/2020 12:30 PM Caren Macadam, MD [facility]       06/02/2020 Jasher Barkan A Harjas Biggins, DO

## 2020-06-02 NOTE — Lactation Note (Signed)
This note was copied from a baby's chart. Lactation Consultation Note Baby 70 hrs old. Baby acts as if he is starving at the breast. MGM gave him 6 ml colostrum and 25 ml DM. Mom giving baby pacifier when LC came in room. Baby rooting vigorously. Offered to assist in latching. Mom stated yes that she is having trouble w/latching or feeding because he goes to sleep on the breast and not BF well. Got baby latch and he was acting frantic at the breast. Got mom to talk to baby can calm him down. Baby quickly went to sleep.  Encouraged mom not to use pacifier for a while for next 2 weeks and explained. Mom agreed. Discussed increasing volume amount if baby isn't feeding on the breast.  W/the volume the baby had right before going to the breast the baby shouldn't had been frantic acting. LC contributes it to nipple confusion.  Mom is going to pump w/her DEBP. Praised mom for her hard work. Encouraged to call for further assistance or questions. Gave mom supplemental amount sheet.    Patient Name: Katie [name] FIEPP'I Date: 06/02/2020 Reason for consult: Follow-up assessment;Term Age:72 hours  Maternal Data    Feeding Mother's Current Feeding Choice: Breast Milk and Donor Milk Nipple Type: Slow - flow  LATCH Score Latch: Grasps breast easily, tongue down, lips flanged, rhythmical sucking.  Audible Swallowing: None  Type of Nipple: Everted at rest and after stimulation  Comfort (Breast/Nipple): Soft / non-tender  Hold (Positioning): Assistance needed to correctly position infant at breast and maintain latch.  LATCH Score: 7   Lactation Tools Discussed/Used Tools: Pump Breast pump type: Double-Electric Breast Pump;Other (comment) (personal DEBP)  Interventions Interventions: Breast feeding basics reviewed;Support pillows;Assisted with latch;Position options;Skin to skin;Breast massage;Pre-pump if needed;DEBP;Breast compression;Adjust position  Discharge    Consult  Status Consult Status: Follow-up Date: 06/02/20 Follow-up type: In-patient    Charyl Dancer 06/02/2020, 3:59 AM

## 2020-06-03 LAB — URINE CULTURE: Culture: NO GROWTH

## 2020-06-04 DIAGNOSIS — F410 Panic disorder [episodic paroxysmal anxiety] without agoraphobia: Secondary | ICD-10-CM | POA: Insufficient documentation

## 2020-06-04 DIAGNOSIS — E669 Obesity, unspecified: Secondary | ICD-10-CM | POA: Insufficient documentation

## 2020-06-04 DIAGNOSIS — T424X1A Poisoning by benzodiazepines, accidental (unintentional), initial encounter: Secondary | ICD-10-CM | POA: Insufficient documentation

## 2020-06-04 DIAGNOSIS — F419 Anxiety disorder, unspecified: Secondary | ICD-10-CM | POA: Insufficient documentation

## 2020-06-04 DIAGNOSIS — J45909 Unspecified asthma, uncomplicated: Secondary | ICD-10-CM | POA: Insufficient documentation

## 2020-06-04 DIAGNOSIS — M797 Fibromyalgia: Secondary | ICD-10-CM | POA: Insufficient documentation

## 2020-06-04 DIAGNOSIS — I10 Essential (primary) hypertension: Secondary | ICD-10-CM | POA: Insufficient documentation

## 2020-06-04 DIAGNOSIS — K760 Fatty (change of) liver, not elsewhere classified: Secondary | ICD-10-CM | POA: Insufficient documentation

## 2020-06-04 DIAGNOSIS — K589 Irritable bowel syndrome without diarrhea: Secondary | ICD-10-CM | POA: Insufficient documentation

## 2020-06-04 DIAGNOSIS — M1990 Unspecified osteoarthritis, unspecified site: Secondary | ICD-10-CM | POA: Insufficient documentation

## 2020-06-05 ENCOUNTER — Other Ambulatory Visit: Payer: Self-pay

## 2020-06-05 ENCOUNTER — Encounter: Payer: Self-pay | Admitting: Cardiology

## 2020-06-05 ENCOUNTER — Ambulatory Visit (INDEPENDENT_AMBULATORY_CARE_PROVIDER_SITE_OTHER): Payer: BC Managed Care – PPO | Admitting: Cardiology

## 2020-06-05 VITALS — BP 130/84 | HR 100 | Ht 66.0 in | Wt 276.0 lb

## 2020-06-05 DIAGNOSIS — I10 Essential (primary) hypertension: Secondary | ICD-10-CM

## 2020-06-05 DIAGNOSIS — R600 Localized edema: Secondary | ICD-10-CM

## 2020-06-05 DIAGNOSIS — O99345 Other mental disorders complicating the puerperium: Secondary | ICD-10-CM

## 2020-06-05 DIAGNOSIS — O10919 Unspecified pre-existing hypertension complicating pregnancy, unspecified trimester: Secondary | ICD-10-CM | POA: Diagnosis not present

## 2020-06-05 DIAGNOSIS — F53 Postpartum depression: Secondary | ICD-10-CM

## 2020-06-05 HISTORY — DX: Unspecified pre-existing hypertension complicating pregnancy, unspecified trimester: O10.919

## 2020-06-05 MED ORDER — FUROSEMIDE 40 MG PO TABS: ORAL_TABLET | ORAL | 3 refills | Status: DC

## 2020-06-05 NOTE — Patient Instructions (Addendum)
Medication Instructions:  Your physician has recommended you make the following change in your medication: START: Lasix 40 mg once daily for 7 days then every other day  *If you need a refill on your cardiac medications before your next appointment, please call your pharmacy*   Lab Work: Your physician recommends that you return for lab work: TODAY: BMET, Mag If you have labs (blood work) drawn today and your tests are completely normal, you will receive your results only by: Marland Kitchen MyChart Message (if you have MyChart) OR . A paper copy in the mail If you have any lab test that is abnormal or we need to change your treatment, we will call you to review the results.   Testing/Procedures: None   Follow-Up: At [facility], you and your health needs are our priority.  As part of our continuing mission to provide you with exceptional heart care, we have created designated Provider Care Teams.  These Care Teams include your primary Cardiologist (physician) and Advanced Practice Providers (APPs -  Physician Assistants and Nurse Practitioners) who all work together to provide you with the care you need, when you need it.  We recommend signing up for the patient portal called "MyChart".  Sign up information is provided on this After Visit Summary.  MyChart is used to connect with patients for Virtual Visits (Telemedicine).  Patients are able to view lab/test results, encounter notes, upcoming appointments, etc.  Non-urgent messages can be sent to your provider as well.   To learn more about what you can do with MyChart, go to [email or web address].    Your next appointment:   June 17th at 3:20pm  The format for your next appointment:   In Person  Provider:   [facility] - Dr. Servando Salina, DO   Other Instructions

## 2020-06-05 NOTE — Progress Notes (Signed)
[facility]  Follow up  This a 35 year old postpartum female with her recent pregnancy complicated by chronic hypertension in pregnancy, paroxysmal supraventricular tachycardia which was suspected to be AVNRT versus atrial tachycardia,Type 2 diabetes mellitus, anxiety, obesity.  We initially saw the patient on April 02, 2020, in our cardio obstetrics clinic due to her cardiovascular risk factors. During her initial visit we ordered an echocardiogram to assess her LV and RV function and for any structural abnormalities.  We also placed a ZIO monitor on the patient.  In the interim we did share the 0 monitor results as well as her echocardiogram results with her.  Since I saw the patient, she has undergone cesarean section and giving birth to her baby.  She was discharged to home on Jun 02, 2020.  Since her discharge she tells me that she has had significant leg edema. She denies chest pain and shortness of breath.  During her visit she is teary and admits to feeling down. She denies any suicidal ideation and homicidal idea.  Prior CV Studies Reviewed: The following studies were reviewed today: TTE 04/30/2020 IMPRESSIONS   1. Left ventricular ejection fraction, by estimation, is 60 to 65%. The left ventricle has normal function. Left ventricular endocardial border not optimally defined to evaluate regional wall motion. There is mild concentric left ventricular  hypertrophy. Left ventricular diastolic parameters are consistent with Grade I diastolic dysfunction (impaired relaxation).   2. Right ventricular systolic function is normal. The right ventricular  size is normal. There is normal pulmonary artery systolic pressure.   3. The mitral valve is normal in structure. No evidence of mitral valve  regurgitation. No evidence of mitral stenosis.   4. The aortic valve is tricuspid. Aortic valve regurgitation is not  visualized. No aortic stenosis is present.   5. The inferior  vena cava is normal in size with greater than 50%  respiratory variability, suggesting right atrial pressure of 3 mmHg.   FINDINGS   Left Ventricle: Left ventricular ejection fraction, by estimation, is 60  to 65%. The left ventricle has normal function. Left ventricular  endocardial border not optimally defined to evaluate regional wall motion.  The left ventricular internal cavity  size was normal in size. There is mild concentric left ventricular  hypertrophy. Left ventricular diastolic parameters are consistent with  Grade I diastolic dysfunction (impaired relaxation). Normal left  ventricular filling pressure.   Right Ventricle: The right ventricular size is normal. No increase in right ventricular wall thickness. Right ventricular systolic function is  normal. There is normal pulmonary artery systolic pressure. The tricuspid  regurgitant velocity is 2.42 m/s, and   with an assumed right atrial pressure of 3 mmHg, the estimated right  ventricular systolic pressure is 36.4 mmHg.   Left Atrium: Left atrial size was normal in size.   Right Atrium: Right atrial size was normal in size.   Pericardium: There is no evidence of pericardial effusion.   Mitral Valve: The mitral valve is normal in structure. No evidence of mitral valve regurgitation. No evidence of mitral valve stenosis.   Tricuspid Valve: The tricuspid valve is normal in structure. Tricuspid valve regurgitation is trivial. No evidence of tricuspid stenosis.   Aortic Valve: The aortic valve is tricuspid. Aortic valve regurgitation is not visualized. No aortic stenosis is present. Aortic valve mean gradient measures 6.0 mmHg. Aortic valve peak gradient measures 11.3 mmHg. Aortic  valve area, by VTI measures 2.72  cm.   Pulmonic Valve: The pulmonic  valve was normal in structure. Pulmonic valve regurgitation is not visualized. No evidence of pulmonic stenosis.   Aorta: The aortic root and ascending aorta are structurally  normal, with  no evidence of dilitation and the aortic arch was not well visualized.   Venous: The pulmonary veins were not well visualized. The inferior vena cava is normal in size with greater than 50% respiratory variability, suggesting right atrial pressure of 3 mmHg.   IAS/Shunts: No atrial level shunt detected by color flow Doppler.   Zio monitor  The patient wore the monitor for 77 hours starting April 02, 2020. Indication: Palpitations   The minimum heart rate was 64 bpm, maximum heart rate was 149 bpm, and average heart rate was 99 bpm. Predominant underlying rhythm was Sinus Rhythm.     Premature atrial complexes were rare less than 1%. Premature Ventricular complexes were rare less than 1%.   No ventricular tachycardia, no significant pauses, No AV block, no supraventricular tachycardia and no atrial fibrillation present.   6 patient triggered events noted are associated with sinus tachycardia and premature ventricular complex.   Conclusion: Normal/unremarkable study with no evidence of significant arrhythmia.   Past Medical History:  Diagnosis Date  . ADHD (attention deficit hyperactivity disorder) 03/01/2011  . Anxiety   . Arthritis    neck   . Asthma    as a child  . Borderline personality disorder ([place]) 05/26/2019  . Bronchospasm 01/19/2014   Formatting of this note might be different from the original. Prn albuterol, WARI  . Chronic neck pain 04/04/2019  . Complex atypical endometrial hyperplasia 02/24/2019  . Depression   . Fibromyalgia   . Hepatic steatosis   . Hypertension   . IBS (irritable bowel syndrome)   . Newly diagnosed diabetes ([place]) 03/09/2019  . Obesity   . Panic attack   . Torticollis 01/19/2014  . Valium overdose     Past Surgical History:  Procedure Laterality Date  . big toes ingrown toenail removed    . CESAREAN SECTION N/A 05/30/2020   Procedure: CESAREAN SECTION;  Surgeon: Azucena Fallen, MD;  Location: [facility];  Service: Obstetrics;   Laterality: N/A;  . DILATATION & CURETTAGE/HYSTEROSCOPY WITH MYOSURE N/A 02/24/2019   Procedure: DILATATION & CURETTAGE/HYSTEROSCOPY WITH MYOSURE;  Surgeon: Azucena Fallen, MD;  Location: [facility];  Service: Gynecology;  Laterality: N/A;  . MOUTH SURGERY     "baby teeth removed,adult teeth  pulled down"  . RADIOLOGY WITH ANESTHESIA N/A 06/07/2019   Procedure: MRI CERVICAL SPINE WITHOUT CONTRAST;  Surgeon: Radiologist, Medication, MD;  Location: [facility];  Service: Radiology;  Laterality: N/A;  . wisdom teeth        OB History    Gravida  3   Para  3   Term  2   Preterm      AB      Living  1     SAB      IAB      Ectopic      Multiple      Live Births  1               Current Medications: Current Meds  Medication Sig  . acetaminophen (TYLENOL) 500 MG tablet Take 2 tablets (1,000 mg total) by mouth every 6 (six) hours.  . blood glucose meter kit and supplies KIT Use as directed twice a day  . Blood Glucose Monitoring Suppl (CONTOUR NEXT ONE) KIT Use as directed  . cetirizine (ZYRTEC)  10 MG tablet Take 10 mg by mouth daily.  . famotidine (PEPCID) 20 MG tablet Take 1 tablet (20 mg total) by mouth 2 (two) times daily. Take in morning and at bedtime  . furosemide (LASIX) 40 MG tablet Lasix 40 mg once daily for 7 days then every other day.  Marland Kitchen glucose blood (CONTOUR NEXT TEST) test strip Use as instructed  . ibuprofen (ADVIL) 600 MG tablet Take 1 tablet (600 mg total) by mouth every 6 (six) hours.  . iron polysaccharides (NIFEREX) 150 MG capsule Take 1 capsule (150 mg total) by mouth 2 (two) times daily.  Marland Kitchen labetalol (NORMODYNE) 100 MG tablet Take 200 mg by mouth 3 (three) times daily.  . Lancets MISC Use as directed  . metFORMIN (GLUCOPHAGE-XR) 750 MG 24 hr tablet Take 750 mg by mouth daily.  Marland Kitchen omeprazole (PRILOSEC) 40 MG capsule Take 1 capsule (40 mg total) by mouth daily.  . [EXPIRED] oxyCODONE (OXY IR/ROXICODONE) 5 MG immediate release tablet Take 1  tablet (5 mg total) by mouth every 6 (six) hours as needed for up to 3 days for moderate pain.  Marland Kitchen sertraline (ZOLOFT) 50 MG tablet Take 1 tablet by mouth daily.     Allergies:   Flexeril [cyclobenzaprine hcl] and Lorazepam   Social History   Socioeconomic History  . Marital status: Single    Spouse name: Not on file  . Number of children: Not on file  . Years of education: Not on file  . Highest education level: Not on file  Occupational History  . Not on file  Tobacco Use  . Smoking status: Former Smoker    Packs/day: 0.50    Years: 4.00    Pack years: 2.00    Types: Cigarettes    Quit date: 01/13/2005    Years since quitting: 15.4  . Smokeless tobacco: Never Used  Vaping Use  . Vaping Use: Never used  Substance and Sexual Activity  . Alcohol use: Not Currently  . Drug use: No  . Sexual activity: Not Currently  Other Topics Concern  . Not on file  Social History Narrative  . Not on file   Social Determinants of Health   Financial Resource Strain: Not on file  Food Insecurity: Not on file  Transportation Needs: Not on file  Physical Activity: Not on file  Stress: Not on file  Social Connections: Not on file      Family History  Problem Relation Age of Onset  . Heart disease Mother   . Depression Mother   . High blood pressure Mother   . Kidney disease Mother   . Psychosis Mother   . Hearing loss Father   . Depression Father   . Anxiety disorder Father   . Asthma Daughter   . Learning disabilities Daughter   . Heart disease Maternal Grandfather   . Other Brother        fatty liver  . Multiple sclerosis Maternal Grandmother   . COPD Paternal Grandmother   . Heart failure Paternal Grandmother   . Heart attack Paternal Grandmother   . Stroke Paternal Grandmother   . Lung cancer Paternal Grandfather       ROS:   Review of Systems  Constitution: Negative for decreased appetite, fever and weight gain.  HENT: Negative for congestion, ear discharge,  hoarse voice and sore throat.   Eyes: Negative for discharge, redness, vision loss in right eye and visual halos.  Cardiovascular:Reports worsening leg edema. Negative for chest pain, dyspnea  on exertion, orthopnea and palpitations.  Respiratory: Negative for cough, hemoptysis, shortness of breath and snoring.   Endocrine: Negative for heat intolerance and polyphagia.  Hematologic/Lymphatic: Negative for bleeding problem. Does not bruise/bleed easily.  Skin: Negative for flushing, nail changes, rash and suspicious lesions.  Musculoskeletal: Negative for arthritis, joint pain, muscle cramps, myalgias, neck pain and stiffness.  Gastrointestinal: Negative for abdominal pain, bowel incontinence, diarrhea and excessive appetite.  Genitourinary: Negative for decreased libido, genital sores and incomplete emptying.  Neurological: Negative for brief paralysis, focal weakness, headaches and loss of balance.  Psychiatric/Behavioral: Reports depression.  Negative for altered mental status, and suicidal ideas.  Allergic/Immunologic: Negative for HIV exposure and persistent infections.   Labs/EKG Reviewed:    EKG:   EKG was not ordered today  Recent Labs: 05/29/2020: ALT 19 05/31/2020: Hemoglobin 8.3; Platelets 134 06/05/2020: BUN 10; Creatinine, Ser 0.58; Magnesium 1.8; Potassium 4.0; Sodium 142   Recent Lipid Panel No results found for: CHOL, TRIG, HDL, CHOLHDL, LDLCALC, LDLDIRECT  Physical Exam:    VS:  BP 130/84 (BP Location: Left Arm, Patient Position: Sitting, Cuff Size: Large)   Pulse 100   Ht '5\' 6"'  (1.676 m)   Wt 276 lb (125.2 kg)   LMP 08/30/2019 (Exact Date)   SpO2 95%   Breastfeeding Unknown   BMI 44.55 kg/m     Wt Readings from Last 3 Encounters:  06/05/20 276 lb (125.2 kg)  05/29/20 283 lb 15.2 oz (128.8 kg)  04/25/20 270 lb 12.8 oz (122.8 kg)     GEN: Well nourished, well developed in no acute distress HEENT: Normal NECK: No JVD; No carotid bruits LYMPHATICS: No  lymphadenopathy CARDIAC: RRR, no murmurs, rubs, gallops RESPIRATORY:  Clear to auscultation without rales, wheezing or rhonchi  ABDOMEN: Soft, non-tender, non-distended MUSCULOSKELETAL: +3 bilateral extremity edema; No deformity  SKIN: Warm and dry NEUROLOGIC:  Alert and oriented x 3 PSYCHIATRIC:  Normal affect    Risk Assessment/Risk Calculators:                  ASSESSMENT & PLAN:    Postpartum cardiovascular visit Chronic hypertension in pregnancy Type 2 diabetes Morbid obesity Bilateral leg edema  The patient is 1 week postpartum.  She is coping with her new normal she tells me.  She does appear to be teary during this visit I suspect the postpartum depression may be playing a role.  We will refer the patient to behavioral health and she is also on Zoloft.  In the meantime she does have significant bilateral leg edema I am going to start her on Lasix 40 mg daily for the next 7 days and then every other day.  We will do blood work today for [employer] and mag to check her kidney function and electrolytes.  Her blood pressure in the office is acceptable for now I am going to keep her on her labetalol 200 mg 3 times daily which I plan to subsequently transition to an ARB and diuretic (valsartan and hydrochlorothiazide).  She is taking metformin for now for her diabetes.   She tells me she has her postpartum visit with the OB/GYN team coming up.  Dispo: 2 weeks  Medication Adjustments/Labs and Tests Ordered: Current medicines are reviewed at length with the patient today.  Concerns regarding medicines are outlined above.  Tests Ordered: Orders Placed This Encounter  Procedures  . Basic metabolic panel  . Magnesium   Medication Changes: Meds ordered this encounter  Medications  . furosemide (LASIX) 40  MG tablet    Sig: Lasix 40 mg once daily for 7 days then every other day.    Dispense:  90 tablet    Refill:  3    Please make directions every other day after initial  fill.

## 2020-06-05 NOTE — Progress Notes (Unsigned)
Cardio-Obstetrics Clinic  {Choose New Eval or Follow Up Note:[phone]}   Prior CV Studies Reviewed: The following studies were reviewed today: ***  Past Medical History:  Diagnosis Date  . ADHD (attention deficit hyperactivity disorder) 03/01/2011  . Anxiety   . Arthritis    neck   . Asthma    as a child  . Borderline personality disorder ([place]) 05/26/2019  . Bronchospasm 01/19/2014   Formatting of this note might be different from the original. Prn albuterol, WARI  . Chronic neck pain 04/04/2019  . Complex atypical endometrial hyperplasia 02/24/2019  . Depression   . Fibromyalgia   . Hepatic steatosis   . Hypertension   . IBS (irritable bowel syndrome)   . Newly diagnosed diabetes ([place]) 03/09/2019  . Obesity   . Panic attack   . Torticollis 01/19/2014  . Valium overdose     Past Surgical History:  Procedure Laterality Date  . big toes ingrown toenail removed    . CESAREAN SECTION N/A 05/30/2020   Procedure: CESAREAN SECTION;  Surgeon: Azucena Fallen, MD;  Location: [facility];  Service: Obstetrics;  Laterality: N/A;  . DILATATION & CURETTAGE/HYSTEROSCOPY WITH MYOSURE N/A 02/24/2019   Procedure: DILATATION & CURETTAGE/HYSTEROSCOPY WITH MYOSURE;  Surgeon: Azucena Fallen, MD;  Location: [facility];  Service: Gynecology;  Laterality: N/A;  . MOUTH SURGERY     "baby teeth removed,adult teeth  pulled down"  . RADIOLOGY WITH ANESTHESIA N/A 06/07/2019   Procedure: MRI CERVICAL SPINE WITHOUT CONTRAST;  Surgeon: Radiologist, Medication, MD;  Location: [facility];  Service: Radiology;  Laterality: N/A;  . wisdom teeth     { Click here to update PMH, PSH, OB Hx then refresh note  :1}   OB History    Gravida  2   Para  1   Term  1   Preterm      AB      Living  1     SAB      IAB      Ectopic      Multiple      Live Births  1           { Click here to update OB Charting then refresh note  :1}    Current Medications: Current Meds  Medication Sig   . acetaminophen (TYLENOL) 500 MG tablet Take 2 tablets (1,000 mg total) by mouth every 6 (six) hours.  . blood glucose meter kit and supplies KIT Use as directed twice a day  . Blood Glucose Monitoring Suppl (CONTOUR NEXT ONE) KIT Use as directed  . cetirizine (ZYRTEC) 10 MG tablet Take 10 mg by mouth daily.  . famotidine (PEPCID) 20 MG tablet Take 1 tablet (20 mg total) by mouth 2 (two) times daily. Take in morning and at bedtime  . glucose blood (CONTOUR NEXT TEST) test strip Use as instructed  . ibuprofen (ADVIL) 600 MG tablet Take 1 tablet (600 mg total) by mouth every 6 (six) hours.  . iron polysaccharides (NIFEREX) 150 MG capsule Take 1 capsule (150 mg total) by mouth 2 (two) times daily.  Marland Kitchen labetalol (NORMODYNE) 100 MG tablet Take 200 mg by mouth 3 (three) times daily.  . Lancets MISC Use as directed  . metFORMIN (GLUCOPHAGE-XR) 750 MG 24 hr tablet Take 750 mg by mouth daily.  Marland Kitchen omeprazole (PRILOSEC) 40 MG capsule Take 1 capsule (40 mg total) by mouth daily.  Marland Kitchen oxyCODONE (OXY IR/ROXICODONE) 5 MG immediate release tablet Take 1 tablet (5  mg total) by mouth every 6 (six) hours as needed for up to 3 days for moderate pain.  Marland Kitchen sertraline (ZOLOFT) 50 MG tablet Take 1 tablet by mouth daily.     Allergies:   Flexeril [cyclobenzaprine hcl] and Lorazepam   Social History   Socioeconomic History  . Marital status: Single    Spouse name: Not on file  . Number of children: Not on file  . Years of education: Not on file  . Highest education level: Not on file  Occupational History  . Not on file  Tobacco Use  . Smoking status: Former Smoker    Packs/day: 0.50    Years: 4.00    Pack years: 2.00    Types: Cigarettes    Quit date: 01/13/2005    Years since quitting: 15.4  . Smokeless tobacco: Never Used  Vaping Use  . Vaping Use: Never used  Substance and Sexual Activity  . Alcohol use: Not Currently  . Drug use: No  . Sexual activity: Not Currently  Other Topics Concern  . Not on  file  Social History Narrative  . Not on file   Social Determinants of Health   Financial Resource Strain: Not on file  Food Insecurity: Not on file  Transportation Needs: Not on file  Physical Activity: Not on file  Stress: Not on file  Social Connections: Not on file  { Click here to update SDOH then refresh :1}    Family History  Problem Relation Age of Onset  . Heart disease Mother   . Depression Mother   . High blood pressure Mother   . Kidney disease Mother   . Psychosis Mother   . Hearing loss Father   . Depression Father   . Anxiety disorder Father   . Asthma Daughter   . Learning disabilities Daughter   . Heart disease Maternal Grandfather   . Other Brother        fatty liver  . Multiple sclerosis Maternal Grandmother   . COPD Paternal Grandmother   . Heart failure Paternal Grandmother   . Heart attack Paternal Grandmother   . Stroke Paternal Grandmother   . Lung cancer Paternal Grandfather    { Click here to update FH then refresh note    :1}   ROS:   Please see the history of present illness.    *** All other systems reviewed and are negative.   Labs/EKG Reviewed:    EKG:   EKG is *** ordered today.  The ekg ordered today demonstrates ***  Recent Labs: 04/02/2020: Magnesium 1.5 05/29/2020: ALT 19; BUN 10; Creatinine, Ser 0.72; Potassium 4.2; Sodium 137 05/31/2020: Hemoglobin 8.3; Platelets 134   Recent Lipid Panel No results found for: CHOL, TRIG, HDL, CHOLHDL, LDLCALC, LDLDIRECT  Physical Exam:    VS:  BP 130/84 (BP Location: Left Arm, Patient Position: Sitting, Cuff Size: Large)   Pulse 100   Ht '5\' 6"'  (1.676 m)   Wt 276 lb (125.2 kg)   LMP 08/30/2019 (Exact Date)   SpO2 95%   BMI 44.55 kg/m     Wt Readings from Last 3 Encounters:  06/05/20 276 lb (125.2 kg)  05/29/20 283 lb 15.2 oz (128.8 kg)  04/25/20 270 lb 12.8 oz (122.8 kg)     GEN: *** Well nourished, well developed in no acute distress HEENT: Normal NECK: No JVD; No carotid  bruits LYMPHATICS: No lymphadenopathy CARDIAC: ***RRR, no murmurs, rubs, gallops RESPIRATORY:  Clear to auscultation without rales, wheezing or  rhonchi  ABDOMEN: Soft, non-tender, non-distended MUSCULOSKELETAL:  No edema; No deformity  SKIN: Warm and dry NEUROLOGIC:  Alert and oriented x 3 PSYCHIATRIC:  Normal affect    Risk Assessment/Risk Calculators:   { Click to calculate CARPREG II - THEN refresh note :1}    { Click to caclulate Mod WHO Class of CV Risk - THEN refresh note :1}     { Click for [id number] Score - THEN Refresh Note    :[id number]}      ASSESSMENT & PLAN:    ***   Dispo:  No follow-ups on file.   Medication Adjustments/Labs and Tests Ordered: Current medicines are reviewed at length with the patient today.  Concerns regarding medicines are outlined above.  Tests Ordered: Orders Placed This Encounter  Procedures  . Basic metabolic panel  . Magnesium   Medication Changes: No orders of the defined types were placed in this encounter.

## 2020-06-06 ENCOUNTER — Encounter: Payer: Self-pay | Admitting: Cardiology

## 2020-06-06 ENCOUNTER — Ambulatory Visit: Payer: BC Managed Care – PPO | Admitting: Family Medicine

## 2020-06-06 LAB — BASIC METABOLIC PANEL WITH GFR
BUN/Creatinine Ratio: 17 (ref 9–23)
BUN: 10 mg/dL (ref 6–20)
CO2: 23 mmol/L (ref 20–29)
Calcium: 8.5 mg/dL — ABNORMAL LOW (ref 8.7–10.2)
Chloride: 106 mmol/L (ref 96–106)
Creatinine, Ser: 0.58 mg/dL (ref 0.57–1.00)
Glucose: 90 mg/dL (ref 65–99)
Potassium: 4 mmol/L (ref 3.5–5.2)
Sodium: 142 mmol/L (ref 134–144)
eGFR: 120 mL/min/1.73

## 2020-06-06 LAB — MAGNESIUM: Magnesium: 1.8 mg/dL (ref 1.6–2.3)

## 2020-06-07 ENCOUNTER — Inpatient Hospital Stay (HOSPITAL_COMMUNITY)
Admission: AD | Admit: 2020-06-07 | Discharge: 2020-06-07 | Disposition: A | Discharge status: Home / Self Care | Payer: BC Managed Care – PPO | Attending: Obstetrics and Gynecology | Admitting: Obstetrics and Gynecology

## 2020-06-07 ENCOUNTER — Other Ambulatory Visit: Payer: Self-pay

## 2020-06-07 ENCOUNTER — Encounter (HOSPITAL_COMMUNITY): Payer: Self-pay | Admitting: Obstetrics and Gynecology

## 2020-06-07 DIAGNOSIS — Z87891 Personal history of nicotine dependence: Secondary | ICD-10-CM | POA: Diagnosis not present

## 2020-06-07 DIAGNOSIS — D509 Iron deficiency anemia, unspecified: Secondary | ICD-10-CM | POA: Diagnosis not present

## 2020-06-07 DIAGNOSIS — Z79899 Other long term (current) drug therapy: Secondary | ICD-10-CM | POA: Diagnosis not present

## 2020-06-07 DIAGNOSIS — O1003 Pre-existing essential hypertension complicating the puerperium: Secondary | ICD-10-CM

## 2020-06-07 DIAGNOSIS — R519 Headache, unspecified: Secondary | ICD-10-CM | POA: Insufficient documentation

## 2020-06-07 DIAGNOSIS — I10 Essential (primary) hypertension: Secondary | ICD-10-CM | POA: Diagnosis not present

## 2020-06-07 DIAGNOSIS — O9903 Anemia complicating the puerperium: Secondary | ICD-10-CM | POA: Diagnosis not present

## 2020-06-07 LAB — URINALYSIS, ROUTINE W REFLEX MICROSCOPIC
Bacteria, UA: NONE SEEN
Bilirubin Urine: NEGATIVE
Glucose, UA: NEGATIVE mg/dL
Ketones, ur: NEGATIVE mg/dL
Nitrite: NEGATIVE
Protein, ur: NEGATIVE mg/dL
Specific Gravity, Urine: 1.012 (ref 1.005–1.030)
pH: 5 (ref 5.0–8.0)

## 2020-06-07 LAB — COMPREHENSIVE METABOLIC PANEL
ALT: 20 U/L (ref 0–44)
AST: 13 U/L — ABNORMAL LOW (ref 15–41)
Albumin: 2.6 g/dL — ABNORMAL LOW (ref 3.5–5.0)
Alkaline Phosphatase: 51 U/L (ref 38–126)
Anion gap: 8 (ref 5–15)
BUN: 13 mg/dL (ref 6–20)
CO2: 27 mmol/L (ref 22–32)
Calcium: 8 mg/dL — ABNORMAL LOW (ref 8.9–10.3)
Chloride: 108 mmol/L (ref 98–111)
Creatinine, Ser: 0.78 mg/dL (ref 0.44–1.00)
GFR, Estimated: >60 mL/min (ref >60)
Glucose, Bld: 102 mg/dL — ABNORMAL HIGH (ref 70–99)
Potassium: 3.5 mmol/L (ref 3.5–5.1)
Sodium: 143 mmol/L (ref 135–145)
Total Bilirubin: 0.7 mg/dL (ref 0.3–1.2)
Total Protein: 5.4 g/dL — ABNORMAL LOW (ref 6.5–8.1)

## 2020-06-07 LAB — CBC WITH DIFFERENTIAL/PLATELET
Abs Immature Granulocytes: 0.1 10*3/uL — ABNORMAL HIGH (ref 0.00–0.07)
Basophils Absolute: 0 10*3/uL (ref 0.0–0.1)
Basophils Relative: 0 %
Eosinophils Absolute: 0.1 10*3/uL (ref 0.0–0.5)
Eosinophils Relative: 2 %
HCT: 24.1 % — ABNORMAL LOW (ref 36.0–46.0)
Hemoglobin: 7.6 g/dL — ABNORMAL LOW (ref 12.0–15.0)
Immature Granulocytes: 2 %
Lymphocytes Relative: 15 %
Lymphs Abs: 0.8 10*3/uL (ref 0.7–4.0)
MCH: 31.8 pg (ref 26.0–34.0)
MCHC: 31.5 g/dL (ref 30.0–36.0)
MCV: 100.8 fL — ABNORMAL HIGH (ref 80.0–100.0)
Monocytes Absolute: 0.5 10*3/uL (ref 0.1–1.0)
Monocytes Relative: 10 %
Neutro Abs: 3.5 10*3/uL (ref 1.7–7.7)
Neutrophils Relative %: 71 %
Platelets: 229 10*3/uL (ref 150–400)
RBC: 2.39 MIL/uL — ABNORMAL LOW (ref 3.87–5.11)
RDW: 13.6 % (ref 11.5–15.5)
WBC: 5.1 10*3/uL (ref 4.0–10.5)
nRBC: 0 % (ref 0.0–0.2)

## 2020-06-07 MED ORDER — BUTALBITAL-APAP-CAFFEINE 50-325-40 MG PO TABS: 1.0000 | ORAL_TABLET | Freq: Four times a day (QID) | ORAL | 0 refills | Status: DC | PRN

## 2020-06-07 MED ORDER — LABETALOL HCL 100 MG PO TABS
200.0000 mg | ORAL_TABLET | Freq: Once | ORAL | Status: AC
  Administered 2020-06-07: 200 mg via ORAL
  Filled 2020-06-07: qty 2

## 2020-06-07 MED ORDER — BUTALBITAL-APAP-CAFFEINE 50-325-40 MG PO TABS
2.0000 | ORAL_TABLET | Freq: Once | ORAL | Status: AC
  Administered 2020-06-07: 2 via ORAL
  Filled 2020-06-07: qty 2

## 2020-06-07 NOTE — Discharge Instructions (Signed)
[phone] Infusion center.     [employer] medicine (25th ed., pp. [phone]). [place], [place]: Elsevier.">  Anemia  Anemia is a condition in which there is not enough red blood cells or hemoglobin in the blood. Hemoglobin is a substance in red blood cells that carries oxygen. When you do not have enough red blood cells or hemoglobin (are anemic), your body cannot get enough oxygen and your organs may not work properly. As a result, you may feel very tired or have other problems. What are the causes? Common causes of anemia include:  Excessive bleeding. Anemia can be caused by excessive bleeding inside or outside the body, including bleeding from the intestines or from heavy menstrual periods in females.  Poor nutrition.  Long-lasting (chronic) kidney, thyroid, and liver disease.  Bone marrow disorders, spleen problems, and blood disorders.  Cancer and treatments for cancer.  HIV (human immunodeficiency virus) and AIDS (acquired immunodeficiency syndrome).  Infections, medicines, and autoimmune disorders that destroy red blood cells. What are the signs or symptoms? Symptoms of this condition include:  Minor weakness.  Dizziness.  Headache, or difficulties concentrating and sleeping.  Heartbeats that feel irregular or faster than normal (palpitations).  Shortness of breath, especially with exercise.  Pale skin, lips, and nails, or cold hands and feet.  Indigestion and nausea. Symptoms may occur suddenly or develop slowly. If your anemia is mild, you may not have symptoms. How is this diagnosed? This condition is diagnosed based on blood tests, your medical history, and a physical exam. In some cases, a test may be needed in which cells are removed from the soft tissue inside of a bone and looked at under a microscope (bone marrow biopsy). Your health care provider may also check your stool (feces) for blood and may do additional testing to look for the cause of your  bleeding. Other tests may include:  Imaging tests, such as a CT scan or MRI.  A procedure to see inside your esophagus and stomach (endoscopy).  A procedure to see inside your colon and rectum (colonoscopy). How is this treated? Treatment for this condition depends on the cause. If you continue to lose a lot of blood, you may need to be treated at a hospital. Treatment may include:  Taking supplements of iron, vitamin X45, or folic acid.  Taking a hormone medicine (erythropoietin) that can help to stimulate red blood cell growth.  Having a blood transfusion. This may be needed if you lose a lot of blood.  Making changes to your diet.  Having surgery to remove your spleen. Follow these instructions at home:  Take over-the-counter and prescription medicines only as told by your health care provider.  Take supplements only as told by your health care provider.  Follow any diet instructions that you were given by your health care provider.  Keep all follow-up visits as told by your health care provider. This is important. Contact a health care provider if:  You develop new bleeding anywhere in the body. Get help right away if:  You are very weak.  You are short of breath.  You have pain in your abdomen or chest.  You are dizzy or feel faint.  You have trouble concentrating.  You have bloody stools, black stools, or tarry stools.  You vomit repeatedly or you vomit up blood. These symptoms may represent a serious problem that is an emergency. Do not wait to see if the symptoms will go away. Get medical help right away. Call your local emergency  services (911 in the [place] Do not drive yourself to the hospital. Summary  Anemia is a condition in which you do not have enough red blood cells or enough of a substance in your red blood cells that carries oxygen (hemoglobin).  Symptoms may occur suddenly or develop slowly.  If your anemia is mild, you may not have  symptoms.  This condition is diagnosed with blood tests, a medical history, and a physical exam. Other tests may be needed.  Treatment for this condition depends on the cause of the anemia. This information is not intended to replace advice given to you by your health care provider. Make sure you discuss any questions you have with your health care provider. Document Revised: 12/07/2018 Document Reviewed: 12/07/2018 Elsevier Patient Education  2021 [employer].

## 2020-06-07 NOTE — MAU Note (Signed)
Pt reports that she has had a headache for 3 days. Pt reports taking ibuprofen and tylenol with no relief.    Pt reports throbbing back pain that has been going on since she had the baby.

## 2020-06-07 NOTE — MAU Provider Note (Signed)
History     CSN: [id number]  Arrival date and time: 06/07/20 1642   Event Date/Time   First Provider Initiated Contact with Patient 06/07/20 1736      No chief complaint on file.  HPI   [name] is a 35 y.o. female Status post primary C/S d/t arrest of dilation on 05/30/20 with a history of [place] here with HA. The HA started 3 days ago. She has no history of headaches. The headache is located in her temples, eyes, and all across her head. She rates her HA pain 9/10. She took ibuprofen 800 mg and 1000 mg of extra strength tylenol early this morning. She reports no relief of HA from those medications. Labetalol 200 mg TID, she only took her AM BP dose. She took 3 doses of her medication yesterday.   OB History    Gravida  2   Para  2   Term  2   Preterm      AB      Living  2     SAB      IAB      Ectopic      Multiple      Live Births  2           Past Medical History:  Diagnosis Date  . ADHD (attention deficit hyperactivity disorder) 03/01/2011  . Anxiety   . Arthritis    neck   . Asthma    as a child  . Borderline personality disorder ([place]) 05/26/2019  . Bronchospasm 01/19/2014   Formatting of this note might be different from the original. Prn albuterol, WARI  . Chronic neck pain 04/04/2019  . Complex atypical endometrial hyperplasia 02/24/2019  . Depression   . Fibromyalgia   . Hepatic steatosis   . Hypertension   . IBS (irritable bowel syndrome)   . Newly diagnosed diabetes ([place]) 03/09/2019  . Obesity   . Panic attack   . Torticollis 01/19/2014  . Valium overdose     Past Surgical History:  Procedure Laterality Date  . big toes ingrown toenail removed    . CESAREAN SECTION N/A 05/30/2020   Procedure: CESAREAN SECTION;  Surgeon: Azucena Fallen, MD;  Location: [facility];  Service: Obstetrics;  Laterality: N/A;  . DILATATION & CURETTAGE/HYSTEROSCOPY WITH MYOSURE N/A 02/24/2019   Procedure: DILATATION & CURETTAGE/HYSTEROSCOPY WITH  MYOSURE;  Surgeon: Azucena Fallen, MD;  Location: [facility];  Service: Gynecology;  Laterality: N/A;  . MOUTH SURGERY     "baby teeth removed,adult teeth  pulled down"  . RADIOLOGY WITH ANESTHESIA N/A 06/07/2019   Procedure: MRI CERVICAL SPINE WITHOUT CONTRAST;  Surgeon: Radiologist, Medication, MD;  Location: [facility];  Service: Radiology;  Laterality: N/A;  . wisdom teeth      Family History  Problem Relation Age of Onset  . Heart disease Mother   . Depression Mother   . High blood pressure Mother   . Kidney disease Mother   . Psychosis Mother   . Hearing loss Father   . Depression Father   . Anxiety disorder Father   . Asthma Daughter   . Learning disabilities Daughter   . Heart disease Maternal Grandfather   . Other Brother        fatty liver  . Multiple sclerosis Maternal Grandmother   . COPD Paternal Grandmother   . Heart failure Paternal Grandmother   . Heart attack Paternal Grandmother   . Stroke Paternal Grandmother   . Lung  cancer Paternal Grandfather     Social History   Tobacco Use  . Smoking status: Former Smoker    Packs/day: 0.50    Years: 4.00    Pack years: 2.00    Types: Cigarettes    Quit date: 01/13/2005    Years since quitting: 15.4  . Smokeless tobacco: Never Used  Vaping Use  . Vaping Use: Never used  Substance Use Topics  . Alcohol use: Not Currently  . Drug use: No    Allergies:  Allergies  Allergen Reactions  . Flexeril [Cyclobenzaprine Hcl] Shortness Of Breath  . Lorazepam     Other reaction(s): Other Neck spasm    Medications Prior to Admission  Medication Sig Dispense Refill Last Dose  . acetaminophen (TYLENOL) 500 MG tablet Take 2 tablets (1,000 mg total) by mouth every 6 (six) hours. 30 tablet 0 06/07/2020 at Unknown time  . blood glucose meter kit and supplies KIT Use as directed twice a day 1 each 0 06/06/2020 at Unknown time  . Blood Glucose Monitoring Suppl (CONTOUR NEXT ONE) KIT Use as directed 1 kit 0  06/06/2020 at Unknown time  . busPIRone (BUSPAR) 7.5 MG tablet Take 7.5 mg by mouth 3 (three) times daily.   06/07/2020 at Unknown time  . cetirizine (ZYRTEC) 10 MG tablet Take 10 mg by mouth daily.   06/07/2020 at Unknown time  . famotidine (PEPCID) 20 MG tablet Take 1 tablet (20 mg total) by mouth 2 (two) times daily. Take in morning and at bedtime 60 tablet 2 06/07/2020 at Unknown time  . furosemide (LASIX) 40 MG tablet Lasix 40 mg once daily for 7 days then every other day. 90 tablet 3 06/07/2020 at Unknown time  . glucose blood (CONTOUR NEXT TEST) test strip Use as instructed 100 each 3 06/06/2020 at Unknown time  . ibuprofen (ADVIL) 600 MG tablet Take 1 tablet (600 mg total) by mouth every 6 (six) hours. 30 tablet 0 06/07/2020 at Unknown time  . labetalol (NORMODYNE) 100 MG tablet Take 200 mg by mouth 3 (three) times daily.   06/07/2020 at Unknown time  . Lancets MISC Use as directed 100 each 0 06/06/2020 at Unknown time  . metFORMIN (GLUCOPHAGE-XR) 750 MG 24 hr tablet Take 750 mg by mouth daily.   06/06/2020 at Unknown time  . sertraline (ZOLOFT) 50 MG tablet Take 1 tablet by mouth daily.   06/07/2020 at Unknown time  . iron polysaccharides (NIFEREX) 150 MG capsule Take 1 capsule (150 mg total) by mouth 2 (two) times daily. 30 capsule 0   . omeprazole (PRILOSEC) 40 MG capsule Take 1 capsule (40 mg total) by mouth daily. 30 capsule 3    Results for orders placed or performed during the hospital encounter of 06/07/20 (from the past 48 hour(s))  Urinalysis, Routine w reflex microscopic Urine, Clean Catch     Status: Abnormal   Collection Time: 06/07/20  5:19 PM  Result Value Ref Range   Color, Urine YELLOW YELLOW   APPearance CLEAR CLEAR   Specific Gravity, Urine 1.012 1.005 - 1.030   pH 5.0 5.0 - 8.0   Glucose, UA NEGATIVE NEGATIVE mg/dL   Hgb urine dipstick MODERATE (A) NEGATIVE   Bilirubin Urine NEGATIVE NEGATIVE   Ketones, ur NEGATIVE NEGATIVE mg/dL   Protein, ur NEGATIVE NEGATIVE mg/dL    Nitrite NEGATIVE NEGATIVE   Leukocytes,Ua TRACE (A) NEGATIVE   RBC / HPF 6-10 0 - 5 RBC/hpf   WBC, UA 0-5 0 - 5 WBC/hpf  Bacteria, UA NONE SEEN NONE SEEN   Squamous Epithelial / LPF 0-5 0 - 5   Mucus PRESENT    Hyaline Casts, UA PRESENT     Comment: Performed at [facility], [place] [address] [place], [place] [address]  CBC with Differential/Platelet     Status: Abnormal   Collection Time: 06/07/20  5:46 PM  Result Value Ref Range   WBC 5.1 4.0 - 10.5 K/uL   RBC 2.39 (L) 3.87 - 5.11 MIL/uL   Hemoglobin 7.6 (L) 12.0 - 15.0 g/dL   HCT 24.1 (L) 36.0 - 46.0 %   MCV 100.8 (H) 80.0 - 100.0 fL   MCH 31.8 26.0 - 34.0 pg   MCHC 31.5 30.0 - 36.0 g/dL   RDW 13.6 11.5 - 15.5 %   Platelets 229 150 - 400 K/uL   nRBC 0.0 0.0 - 0.2 %   Neutrophils Relative % 71 %   Neutro Abs 3.5 1.7 - 7.7 K/uL   Lymphocytes Relative 15 %   Lymphs Abs 0.8 0.7 - 4.0 K/uL   Monocytes Relative 10 %   Monocytes Absolute 0.5 0.1 - 1.0 K/uL   Eosinophils Relative 2 %   Eosinophils Absolute 0.1 0.0 - 0.5 K/uL   Basophils Relative 0 %   Basophils Absolute 0.0 0.0 - 0.1 K/uL   Immature Granulocytes 2 %   Abs Immature Granulocytes 0.10 (H) 0.00 - 0.07 K/uL    Comment: Performed at [facility] [address] [place], [place] [address]  Comprehensive metabolic panel     Status: Abnormal   Collection Time: 06/07/20  5:46 PM  Result Value Ref Range   Sodium 143 135 - 145 mmol/L   Potassium 3.5 3.5 - 5.1 mmol/L   Chloride 108 98 - 111 mmol/L   CO2 27 22 - 32 mmol/L   Glucose, Bld 102 (H) 70 - 99 mg/dL    Comment: Glucose reference range applies only to samples taken after fasting for at least 8 hours.   BUN 13 6 - 20 mg/dL   Creatinine, Ser 0.78 0.44 - 1.00 mg/dL   Calcium 8.0 (L) 8.9 - 10.3 mg/dL   Total Protein 5.4 (L) 6.5 - 8.1 g/dL   Albumin 2.6 (L) 3.5 - 5.0 g/dL   AST 13 (L) 15 - 41 U/L   ALT 20 0 - 44 U/L   Alkaline Phosphatase 51 38 - 126 U/L   Total Bilirubin 0.7 0.3 - 1.2 mg/dL   GFR,  Estimated >60 >60 mL/min    Comment: (NOTE) Calculated using the CKD-EPI Creatinine Equation (2021)    Anion gap 8 5 - 15    Comment: Performed at [facility], [place] [address], [place],  [address]    Review of Systems  Constitutional: Negative for fever.  Eyes: Negative for photophobia and visual disturbance.  Neurological: Positive for headaches.   Physical Exam   Blood pressure (!) 147/71, pulse 100, temperature 99.2 F (37.3 C), resp. rate 16, last menstrual period 08/30/2019, SpO2 99 %, unknown if currently breastfeeding.   Patient Vitals for the past 24 hrs:  BP Temp Pulse Resp SpO2  06/07/20 2016 (!) 145/72 -- 91 -- --  06/07/20 2001 (!) 145/78 -- 90 -- --  06/07/20 1946 137/72 -- 89 -- --  06/07/20 1931 133/71 -- 83 -- --  06/07/20 1916 138/75 -- 87 -- --  06/07/20 1901 127/68 -- 86 -- --  06/07/20 1846 134/71 -- 91 -- --  06/07/20 1832 133/75 -- 94 -- --  06/07/20 1816 (!) 146/75 -- 97 -- --  06/07/20 1801 (!) 144/76 -- (!) 102 -- --  06/07/20 1751 (!) 144/72 -- 99 -- --  06/07/20 1731 (!) 147/71 -- 100 -- --  06/07/20 1713 (!) 146/81 -- 94 -- --  06/07/20 1710 -- 99.2 F (37.3 C) -- 16 99 %    Physical Exam Vitals and nursing note reviewed.  Constitutional:      General: She is not in acute distress.    Appearance: She is well-developed. She is not toxic-appearing.  HENT:     Head: Normocephalic.  Eyes:     Extraocular Movements: Extraocular movements intact.  Pulmonary:     Effort: Pulmonary effort is normal.     Breath sounds: Normal breath sounds.  Abdominal:     Comments: Dressing has been removed by OB provider. Incision is clear, dry, without erythema.   Musculoskeletal:     Cervical back: Neck supple.  Neurological:     Mental Status: She is alert and oriented to person, place, and time.     GCS: GCS eye subscore is 4. GCS verbal subscore is 5. GCS motor subscore is 6.     Deep Tendon Reflexes: Reflexes normal.     Comments:  Negative clonus   Psychiatric:        Behavior: Behavior normal.    MAU Course  Procedures  None  MDM  200 mg of oral labetalol given Fioricet 2 tablets given. Patient now rates her HA pain 0/10 Reviewed patient with Dr. Harolyn Rutherford Hgb down to 7.6, patient received a dose of Venofer PP and has not received a 2nd dose. D/t 4 hour infusion time, will schedule this outpatient at the infusion center. Patient is stable at this time. Informed Dr. Valentino Saxon that patient will need f/u for BP check, and arrangements for iron infusion at the infusion center.  Assessment and Plan   A:  1. Iron deficiency anemia, unspecified iron deficiency anemia type   2. Chronic hypertension   3. Postpartum state     P:  Discharge home in stable condition Continue iron every other day Continue your current dose of labetalol 200 mg TID Return to MAU if symptoms worsen   Bona Hubbard, Artist Pais, NP 06/08/2020 1:58 PM

## 2020-06-08 ENCOUNTER — Telehealth: Payer: Self-pay

## 2020-06-08 ENCOUNTER — Other Ambulatory Visit: Payer: Self-pay | Admitting: Obstetrics & Gynecology

## 2020-06-08 DIAGNOSIS — D62 Acute posthemorrhagic anemia: Secondary | ICD-10-CM

## 2020-06-08 NOTE — Telephone Encounter (Signed)
Left message for patient to return our call.

## 2020-06-12 ENCOUNTER — Other Ambulatory Visit: Payer: Self-pay

## 2020-06-13 MED ORDER — BUTALBITAL-APAP-CAFFEINE 50-325-40 MG PO TABS: 1.0000 | ORAL_TABLET | Freq: Four times a day (QID) | ORAL | 0 refills | Status: AC | PRN

## 2020-06-13 NOTE — Telephone Encounter (Signed)
I will refill this, but it is important to make sure her blood pressures are doing well and that edema is doing better. I don't want to be masking signs of high blood pressure.

## 2020-06-14 ENCOUNTER — Other Ambulatory Visit: Payer: Self-pay

## 2020-06-14 ENCOUNTER — Non-Acute Institutional Stay (HOSPITAL_COMMUNITY)
Admission: RE | Admit: 2020-06-14 | Discharge: 2020-06-14 | Disposition: A | Discharge status: Home / Self Care | Payer: BC Managed Care – PPO | Source: Ambulatory Visit | Attending: Internal Medicine | Admitting: Internal Medicine

## 2020-06-14 DIAGNOSIS — D62 Acute posthemorrhagic anemia: Secondary | ICD-10-CM | POA: Diagnosis present

## 2020-06-14 MED ORDER — ACETAMINOPHEN 500 MG PO TABS
1000.0000 mg | ORAL_TABLET | Freq: Once | ORAL | Status: AC
  Administered 2020-06-14: 1000 mg via ORAL
  Filled 2020-06-14: qty 2

## 2020-06-14 MED ORDER — DIPHENHYDRAMINE HCL 25 MG PO CAPS
25.0000 mg | ORAL_CAPSULE | Freq: Once | ORAL | Status: AC
  Administered 2020-06-14: 25 mg via ORAL
  Filled 2020-06-14: qty 1

## 2020-06-14 MED ORDER — SODIUM CHLORIDE 0.9 % IV SOLN
INTRAVENOUS | Status: DC | PRN
  Administered 2020-06-14: 250 mL via INTRAVENOUS

## 2020-06-14 MED ORDER — SODIUM CHLORIDE 0.9 % IV SOLN
500.0000 mg | Freq: Once | INTRAVENOUS | Status: AC
Start: 2020-06-14 — End: 2020-06-14
  Administered 2020-06-14: 500 mg via INTRAVENOUS
  Filled 2020-06-14: qty 25

## 2020-06-14 NOTE — Discharge Instructions (Signed)

## 2020-06-14 NOTE — Progress Notes (Signed)
PATIENT CARE CENTER NOTE    Diagnosis: Acute blood loss anemia (D62)   Provider: Shea Evans, MD   Procedure: Venofer infusion    Note: Patient received Venofer 500 mg infusion x 1 via PIV. Pre-medications (Tylenol and Benadryl) given per order. Patient tolerated infusion well with no adverse reaction. Vital signs stable. Discharge instructions given. Patient alert, oriented and ambulatory at discharge.

## 2020-06-15 ENCOUNTER — Encounter: Payer: Self-pay | Admitting: Cardiology

## 2020-06-15 ENCOUNTER — Ambulatory Visit (INDEPENDENT_AMBULATORY_CARE_PROVIDER_SITE_OTHER): Payer: BC Managed Care – PPO | Admitting: Cardiology

## 2020-06-15 ENCOUNTER — Other Ambulatory Visit: Payer: Self-pay

## 2020-06-15 VITALS — BP 152/82 | HR 103 | Ht 66.0 in | Wt 250.8 lb

## 2020-06-15 DIAGNOSIS — Z79899 Other long term (current) drug therapy: Secondary | ICD-10-CM | POA: Diagnosis not present

## 2020-06-15 DIAGNOSIS — F32A Depression, unspecified: Secondary | ICD-10-CM

## 2020-06-15 DIAGNOSIS — O163 Unspecified maternal hypertension, third trimester: Secondary | ICD-10-CM | POA: Diagnosis not present

## 2020-06-15 MED ORDER — HYDROCHLOROTHIAZIDE 25 MG PO TABS: 25.0000 mg | ORAL_TABLET | Freq: Every day | ORAL | 3 refills | Status: DC

## 2020-06-15 NOTE — Progress Notes (Signed)
[facility]  Follow up visit   Katie [name] is a 35 year old female postpartum female with her recent pregnancy complicated by chronic hypertension in pregnancy, paroxysmal supraventricular tachycardia which was suspected to be AVNRT versus atrial tachycardia,Type 2 diabetes mellitus, anxiety, obesity.  We initially saw the patient on April 02, 2020, in our cardio obstetrics clinic due to her cardiovascular risk factors. During her initial visit we ordered an echocardiogram to assess her LV and RV function and for any structural abnormalities.  We also placed a ZIO monitor on the patient- and these results were discussed with her.  She did undergo cesarean section and giving birth to her baby.  She was discharged to home on Jun 02, 2020.  Since her discharge she tells me that she has had significant leg edema. She denies chest pain and shortness of breath. I saw the patient Jun 05, 2020 at that time she appeared to be volume overloaded.  I started her on Lasix to help with diuresis.  Her last visit she was admitted due to iron deficiency anemia.  She is doing well she has gotten some iron infusions.  She offers no complaints today.  When talking to the patient to understand how she [place] she did not tell me she still is experiencing some postpartum depression.  Of note she tells me that she is still taking some antibiotic for her incision site and she is following with her OB for this. Prior CV Studies Reviewed: The following studies were reviewed today: TTE 04-28-2020 IMPRESSIONS  1. Left ventricular ejection fraction, by estimation, is 60 to 65%. The left ventricle has normal function. Left ventricular endocardial border not optimally defined to evaluate regional wall motion. There is mild concentric left ventricular  hypertrophy. Left ventricular diastolic parameters are consistent with Grade I diastolic dysfunction (impaired relaxation).  2. Right ventricular  systolic function is normal. The right ventricular  size is normal. There is normal pulmonary artery systolic pressure.  3. The mitral valve is normal in structure. No evidence of mitral valve  regurgitation. No evidence of mitral stenosis.  4. The aortic valve is tricuspid. Aortic valve regurgitation is not  visualized. No aortic stenosis is present.  5. The inferior vena cava is normal in size with greater than 50%  respiratory variability, suggesting right atrial pressure of 3 mmHg.   FINDINGS  Left Ventricle: Left ventricular ejection fraction, by estimation, is 60  to 65%. The left ventricle has normal function. Left ventricular  endocardial border not optimally defined to evaluate regional wall motion.  The left ventricular internal cavity  size was normal in size. There is mild concentric left ventricular  hypertrophy. Left ventricular diastolic parameters are consistent with  Grade I diastolic dysfunction (impaired relaxation). Normal left  ventricular filling pressure.   Right Ventricle: The right ventricular size is normal. No increase in right ventricular wall thickness. Right ventricular systolic function is  normal. There is normal pulmonary artery systolic pressure. The tricuspid  regurgitant velocity is 2.42 m/s, and  with an assumed right atrial pressure of 3 mmHg, the estimated right  ventricular systolic pressure is 33.0 mmHg.   Left Atrium: Left atrial size was normal in size.   Right Atrium: Right atrial size was normal in size.   Pericardium: There is no evidence of pericardial effusion.   Mitral Valve: The mitral valve is normal in structure. No evidence of mitral valve regurgitation. No evidence of mitral valve stenosis.   Tricuspid Valve: The tricuspid valve  is normal in structure. Tricuspid valve regurgitation is trivial. No evidence of tricuspid stenosis.   Aortic Valve: The aortic valve is tricuspid. Aortic valve regurgitation is not visualized. No  aortic stenosis is present. Aortic valve mean gradient measures 6.0 mmHg. Aortic valve peak gradient measures 11.3 mmHg. Aortic  valve area, by VTI measures 2.72 cm.   Pulmonic Valve: The pulmonic valve was normal in structure. Pulmonic valve regurgitation is not visualized. No evidence of pulmonic stenosis.   Aorta: The aortic root and ascending aorta are structurally normal, with  no evidence of dilitation and the aortic arch was not well visualized.   Venous: The pulmonary veins were not well visualized. The inferior vena cava is normal in size with greater than 50% respiratory variability, suggesting right atrial pressure of 3 mmHg.   IAS/Shunts: No atrial level shunt detected by color flow Doppler.   Zio monitor  The patient wore the monitor for 77 hours starting April 02, 2020. Indication: Palpitations  The minimum heart rate was 64 bpm, maximum heart rate was 149 bpm, and average heart rate was 99 bpm. Predominant underlying rhythm was Sinus Rhythm.   Premature atrial complexes were rare less than 1%. Premature Ventricular complexes were rare less than 1%.  No ventricular tachycardia, no significant pauses, No AV block, no supraventricular tachycardia and no atrial fibrillation present.  6 patient triggered events noted are associated with sinus tachycardia and premature ventricular complex.  Conclusion: Normal/unremarkable study with no evidence of significant arrhythmia.   Past Medical History:  Diagnosis Date  . ADHD (attention deficit hyperactivity disorder) 03/01/2011  . Anxiety   . Arthritis    neck   . Asthma    as a child  . Borderline personality disorder ([place]) 05/26/2019  . Bronchospasm 01/19/2014   Formatting of this note might be different from the original. Prn albuterol, WARI  . Chronic neck pain 04/04/2019  . Complex atypical endometrial hyperplasia 02/24/2019  . Depression   . Fibromyalgia   . Hepatic steatosis   . Hypertension   . IBS  (irritable bowel syndrome)   . Newly diagnosed diabetes ([place]) 03/09/2019  . Obesity   . Panic attack   . Torticollis 01/19/2014  . Valium overdose     Past Surgical History:  Procedure Laterality Date  . big toes ingrown toenail removed    . CESAREAN SECTION N/A 05/30/2020   Procedure: CESAREAN SECTION;  Surgeon: Azucena Fallen, MD;  Location: [facility];  Service: Obstetrics;  Laterality: N/A;  . DILATATION & CURETTAGE/HYSTEROSCOPY WITH MYOSURE N/A 02/24/2019   Procedure: DILATATION & CURETTAGE/HYSTEROSCOPY WITH MYOSURE;  Surgeon: Azucena Fallen, MD;  Location: [facility];  Service: Gynecology;  Laterality: N/A;  . MOUTH SURGERY     "baby teeth removed,adult teeth  pulled down"  . RADIOLOGY WITH ANESTHESIA N/A 06/07/2019   Procedure: MRI CERVICAL SPINE WITHOUT CONTRAST;  Surgeon: Radiologist, Medication, MD;  Location: [facility];  Service: Radiology;  Laterality: N/A;  . wisdom teeth        OB History    Gravida  2   Para  2   Term  2   Preterm      AB      Living  2     SAB      IAB      Ectopic      Multiple      Live Births  2               Current  Medications: Current Meds  Medication Sig  . acetaminophen (TYLENOL) 500 MG tablet Take 2 tablets (1,000 mg total) by mouth every 6 (six) hours.  . blood glucose meter kit and supplies KIT Use as directed twice a day  . Blood Glucose Monitoring Suppl (CONTOUR NEXT ONE) KIT Use as directed  . busPIRone (BUSPAR) 7.5 MG tablet Take 7.5 mg by mouth 3 (three) times daily.  . butalbital-acetaminophen-caffeine (FIORICET) 50-325-40 MG tablet Take 1-2 tablets by mouth every 6 (six) hours as needed for headache.  . cephALEXin (KEFLEX) 500 MG capsule Take 500 mg by mouth 2 (two) times daily.  . cetirizine (ZYRTEC) 10 MG tablet Take 10 mg by mouth daily.  . famotidine (PEPCID) 20 MG tablet Take 1 tablet (20 mg total) by mouth 2 (two) times daily. Take in morning and at bedtime  . furosemide (LASIX) 40 MG tablet  Lasix 40 mg once daily for 7 days then every other day.  Marland Kitchen glucose blood (CONTOUR NEXT TEST) test strip Use as instructed  . hydrochlorothiazide (HYDRODIURIL) 25 MG tablet Take 1 tablet (25 mg total) by mouth daily.  Marland Kitchen ibuprofen (ADVIL) 600 MG tablet Take 1 tablet (600 mg total) by mouth every 6 (six) hours.  . iron polysaccharides (NIFEREX) 150 MG capsule Take 1 capsule (150 mg total) by mouth 2 (two) times daily.  Marland Kitchen labetalol (NORMODYNE) 100 MG tablet Take 200 mg by mouth 3 (three) times daily.  . Lancets MISC Use as directed  . metFORMIN (GLUCOPHAGE-XR) 750 MG 24 hr tablet Take 750 mg by mouth daily.  Marland Kitchen omeprazole (PRILOSEC) 40 MG capsule Take 1 capsule (40 mg total) by mouth daily.  . sertraline (ZOLOFT) 100 MG tablet Take 1 tablet by mouth daily.  . [DISCONTINUED] sertraline (ZOLOFT) 50 MG tablet Take 1 tablet by mouth daily.     Allergies:   Flexeril [cyclobenzaprine hcl] and Lorazepam   Social History   Socioeconomic History  . Marital status: Single    Spouse name: Not on file  . Number of children: Not on file  . Years of education: Not on file  . Highest education level: Not on file  Occupational History  . Not on file  Tobacco Use  . Smoking status: Former Smoker    Packs/day: 0.50    Years: 4.00    Pack years: 2.00    Types: Cigarettes    Quit date: 01/13/2005    Years since quitting: 15.4  . Smokeless tobacco: Never Used  Vaping Use  . Vaping Use: Never used  Substance and Sexual Activity  . Alcohol use: Not Currently  . Drug use: No  . Sexual activity: Not Currently  Other Topics Concern  . Not on file  Social History Narrative  . Not on file   Social Determinants of Health   Financial Resource Strain: Low Risk   . Difficulty of Paying Living Expenses: Not hard at all  Food Insecurity: Not on file  Transportation Needs: No Transportation Needs  . Lack of Transportation (Medical): No  . Lack of Transportation (Non-Medical): No  Physical Activity: Not on  file  Stress: Not on file  Social Connections: Not on file      Family History  Problem Relation Age of Onset  . Heart disease Mother   . Depression Mother   . High blood pressure Mother   . Kidney disease Mother   . Psychosis Mother   . Hearing loss Father   . Depression Father   . Anxiety disorder  Father   . Asthma Daughter   . Learning disabilities Daughter   . Heart disease Maternal Grandfather   . Other Brother        fatty liver  . Multiple sclerosis Maternal Grandmother   . COPD Paternal Grandmother   . Heart failure Paternal Grandmother   . Heart attack Paternal Grandmother   . Stroke Paternal Grandmother   . Lung cancer Paternal Grandfather       ROS:   Please see the history of present illness.      Labs/EKG Reviewed:    EKG:   Review of Systems  Constitution: Negative for decreased appetite, fever and weight gain.  HENT: Negative for congestion, ear discharge, hoarse voice and sore throat.   Eyes: Negative for discharge, redness, vision loss in right eye and visual halos.  Cardiovascular: Negative for chest pain, dyspnea on exertion, leg swelling, orthopnea and palpitations.  Respiratory: Negative for cough, hemoptysis, shortness of breath and snoring.   Endocrine: Negative for heat intolerance and polyphagia.  Hematologic/Lymphatic: Negative for bleeding problem. Does not bruise/bleed easily.  Skin: Negative for flushing, nail changes, rash and suspicious lesions.  Musculoskeletal: Negative for arthritis, joint pain, muscle cramps, myalgias, neck pain and stiffness.  Gastrointestinal: Negative for abdominal pain, bowel incontinence, diarrhea and excessive appetite.  Genitourinary: Negative for decreased libido, genital sores and incomplete emptying.  Neurological: Negative for brief paralysis, focal weakness, headaches and loss of balance.  Psychiatric/Behavioral: Negative for altered mental status, depression and suicidal ideas.  Allergic/Immunologic:  Negative for HIV exposure and persistent infections.    Recent Labs: 06/05/2020: Magnesium 1.8 06/07/2020: ALT 20; BUN 13; Creatinine, Ser 0.78; Hemoglobin 7.6; Platelets 229; Potassium 3.5; Sodium 143   Recent Lipid Panel No results found for: CHOL, TRIG, HDL, CHOLHDL, LDLCALC, LDLDIRECT  Physical Exam:    VS:  BP (!) 152/82   Pulse (!) 103   Ht 5' 6" (1.676 m)   Wt 250 lb 12.8 oz (113.8 kg)   SpO2 100%   BMI 40.48 kg/m     Wt Readings from Last 3 Encounters:  06/15/20 250 lb 12.8 oz (113.8 kg)  06/05/20 276 lb (125.2 kg)  05/29/20 283 lb 15.2 oz (128.8 kg)     GEN:  Well nourished, well developed in no acute distress HEENT: Mucous membranes moist, good dentition NECK: No JVD; No carotid bruits LYMPHATICS: No lymphadenopathy CARDIAC: S1S2 noted, RRR, no murmurs, rubs, gallops RESPIRATORY:  Clear to auscultation without rales, wheezing or rhonchi  ABDOMEN: Soft, non-tender, non-distended, bowel sounds noted, no guarding EXTREMITIES:No cyanosis, no cyanosis, no clubbing MUSCULOSKELETAL: No deformity  SKIN: Warm and dry NEUROLOGIC:  Alert and oriented x 3, nonfocal PSYCHIATRIC:  Normal affect, good insight  Risk Assessment/Risk Calculators:                  ASSESSMENT & PLAN:    Postpartum cardiovascular visit Chronic hypertension in pregnancy, Hypertension Type 2 diabetes, morbid obesity Postpartum depression Supraventricular tachycardia   Thankfully her bilateral leg edema has resolved.  She has not been taking her Lasix.  But she is hypertensive in the office.  I will keep her on her labetalol for now.  I am going to start hydrochlorothiazide 25 mg daily.  She will take her blood pressure daily and send me that information in 7 days.  I am hoping by her next visit to be able to transition the patient off the labetalol. Due to her postpartum depression we will refer the patient to behavioral health.  In the meantime she will continue to take her  Zoloft.  The patient understands the need to lose weight with diet and exercise. We have discussed specific strategies for this.  Dispo: 4 weeks due to medication change.  Medication Adjustments/Labs and Tests Ordered: Current medicines are reviewed at length with the patient today.  Concerns regarding medicines are outlined above.  Tests Ordered: Orders Placed This Encounter  Procedures  . Basic metabolic panel  . Magnesium  . Ambulatory referral to Psychology   Medication Changes: Meds ordered this encounter  Medications  . hydrochlorothiazide (HYDRODIURIL) 25 MG tablet    Sig: Take 1 tablet (25 mg total) by mouth daily.    Dispense:  90 tablet    Refill:  3

## 2020-06-15 NOTE — Patient Instructions (Addendum)
Medication Instructions:  Your physician has recommended you make the following change in your medication:  STOP: Lasix START: Hydrochlorothiazide 25 mg once daily *If you need a refill on your cardiac medications before your next appointment, please call your pharmacy*   Lab Work: Your physician recommends that you return for lab work: TODAY: BMET, Mag If you have labs (blood work) drawn today and your tests are completely normal, you will receive your results only by: Marland Kitchen MyChart Message (if you have MyChart) OR . A paper copy in the mail If you have any lab test that is abnormal or we need to change your treatment, we will call you to review the results.   Testing/Procedures: None  Follow-Up: At [facility], you and your health needs are our priority.  As part of our continuing mission to provide you with exceptional heart care, we have created designated Provider Care Teams.  These Care Teams include your primary Cardiologist (physician) and Advanced Practice Providers (APPs -  Physician Assistants and Nurse Practitioners) who all work together to provide you with the care you need, when you need it.  We recommend signing up for the patient portal called "MyChart".  Sign up information is provided on this After Visit Summary.  MyChart is used to connect with patients for Virtual Visits (Telemedicine).  Patients are able to view lab/test results, encounter notes, upcoming appointments, etc.  Non-urgent messages can be sent to your provider as well.   To learn more about what you can do with MyChart, go to [email or web address].    Your next appointment:   5-6 week(s)  The format for your next appointment:   In Person  Provider:   [facility] - Kardie Tobb, DO   Other Instructions Please take your blood pressure daily. In 7 days send the information via MyChart.

## 2020-06-18 ENCOUNTER — Encounter: Payer: Self-pay | Admitting: Family Medicine

## 2020-06-18 ENCOUNTER — Ambulatory Visit (INDEPENDENT_AMBULATORY_CARE_PROVIDER_SITE_OTHER): Payer: BC Managed Care – PPO | Admitting: Family Medicine

## 2020-06-18 ENCOUNTER — Other Ambulatory Visit: Payer: Self-pay

## 2020-06-18 VITALS — BP 116/88 | HR 114 | Temp 99.4°F | Ht 66.0 in | Wt 246.8 lb

## 2020-06-18 DIAGNOSIS — M436 Torticollis: Secondary | ICD-10-CM

## 2020-06-18 DIAGNOSIS — Z13220 Encounter for screening for lipoid disorders: Secondary | ICD-10-CM | POA: Diagnosis not present

## 2020-06-18 DIAGNOSIS — E559 Vitamin D deficiency, unspecified: Secondary | ICD-10-CM

## 2020-06-18 DIAGNOSIS — F419 Anxiety disorder, unspecified: Secondary | ICD-10-CM

## 2020-06-18 DIAGNOSIS — I10 Essential (primary) hypertension: Secondary | ICD-10-CM

## 2020-06-18 DIAGNOSIS — E538 Deficiency of other specified B group vitamins: Secondary | ICD-10-CM | POA: Diagnosis not present

## 2020-06-18 DIAGNOSIS — R739 Hyperglycemia, unspecified: Secondary | ICD-10-CM

## 2020-06-18 DIAGNOSIS — F332 Major depressive disorder, recurrent severe without psychotic features: Secondary | ICD-10-CM

## 2020-06-18 DIAGNOSIS — D509 Iron deficiency anemia, unspecified: Secondary | ICD-10-CM

## 2020-06-18 DIAGNOSIS — Z0000 Encounter for general adult medical examination without abnormal findings: Secondary | ICD-10-CM

## 2020-06-18 LAB — VITAMIN B12: Vitamin B-12: 895 pg/mL (ref 211–911)

## 2020-06-18 LAB — CBC WITH DIFFERENTIAL/PLATELET
Basophils Absolute: 0 10*3/uL (ref 0.0–0.1)
Basophils Relative: 0.4 % (ref 0.0–3.0)
Eosinophils Absolute: 0.2 10*3/uL (ref 0.0–0.7)
Eosinophils Relative: 3 % (ref 0.0–5.0)
HCT: 35.7 % — ABNORMAL LOW (ref 36.0–46.0)
Hemoglobin: 11.9 g/dL — ABNORMAL LOW (ref 12.0–15.0)
Lymphocytes Relative: 18.4 % (ref 12.0–46.0)
Lymphs Abs: 1.4 10*3/uL (ref 0.7–4.0)
MCHC: 33.4 g/dL (ref 30.0–36.0)
MCV: 93.2 fl (ref 78.0–100.0)
Monocytes Absolute: 0.5 10*3/uL (ref 0.1–1.0)
Monocytes Relative: 6.4 % (ref 3.0–12.0)
Neutro Abs: 5.5 10*3/uL (ref 1.4–7.7)
Neutrophils Relative %: 71.8 % (ref 43.0–77.0)
Platelets: 324 10*3/uL (ref 150.0–400.0)
RBC: 3.83 Mil/uL — ABNORMAL LOW (ref 3.87–5.11)
RDW: 15 % (ref 11.5–15.5)
WBC: 7.6 10*3/uL (ref 4.0–10.5)

## 2020-06-18 LAB — VITAMIN D 25 HYDROXY (VIT D DEFICIENCY, FRACTURES): VITD: 23.45 ng/mL — ABNORMAL LOW (ref 30.00–100.00)

## 2020-06-18 LAB — HEMOGLOBIN A1C: Hgb A1c MFr Bld: 5.5 % (ref 4.6–6.5)

## 2020-06-18 LAB — FERRITIN: Ferritin: 228.7 ng/mL (ref 10.0–291.0)

## 2020-06-18 NOTE — Progress Notes (Signed)
Tiphany Fayson DOB: [birth year] Encounter date: 06/18/2020  This is a 35 y.o. female who presents for complete physical   History of present illness/Additional concerns: Following with cardiology, Dr. Harriet Masson for SVT, htn during pregnancy. Last visit 6/3 and edema was better (even without lasix). Labetalol continued and hctz 72m daily started. She also referred her to behavioral health and continued her zoloft. She has not yet met with new behavioral health provider. Mood has not changed much. See below.   Patient states that swelling is doing much better.   Delivered healthy boy: 05/30/20 via CS at [id number] FTP.   Saw gyn Friday. Incision not healing well. Blistering, peeling. She is on antibiotics now. She doesn't feel like it is getting better. She is getting clear, yellow drainage. She is now on dicloxacillin. She seems to be tolerating this ok.   [facility] then [facility]200. She is stuck. Staying with mom now. Has had years of difficulty having a good relationship with her mother. She feels that her mother only does things that benefit herself and she does not feel that her needs are considered. She does not feel like she has a good support system. Her mother is helping her with feeding baby, changing, holding/soothing. Her daughter also helps. She is staying in the same room as her mother; so she doesn't feel she can get any time to herself. Feels that mother is always judging her and putting her down. Doesn't feel like she grew up with same standards are her brothers (ie older brother lives with mom but she feels no one says anything about his life choices). She feels upset about having a c section. She doesn't feel like the decision to proceed with c section was well discussed with her. We discussed that things can change quickly in [place] and [place] and that it is the gynecologist's responsibility to make sure both mother and baby do well; so sometimes things can  happen quickly and may not go the direction that we plan; but she still feels that she needed more information and that people were not focused on her during surgery. She is not sure when she will return to work or get clearance to do so. 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 She states that her mom will likely help with child care once she returns to work.   Tries to eat healthy. But states that mom/family take healthy food.   Energy level not better. Doesn't feel like doing anything.   Torticollis: she was planning for injection and had done PT (without benefit) but ended up finding out she was pregnant just prior to injection so this was put on hold. She is following with Dr. RaNelva Bush  Only with spotting now. Heavy bleeding has stopped (as of about 1.5 weeks ago)  Past Medical History:  Diagnosis Date   ADHD (attention deficit hyperactivity disorder) 03/01/2011   Anxiety    Arthritis    neck    Asthma    as a child   [place]line [place] disorder ([facility]   Bronchospasm 01/19/2014   Formatting of this note might be different from the original. Prn albuterol, [facility]   Chronic neck pain 04/04/2019   Complex atypical endometrial hyperplasia 02/24/2019   Depression    Fibromyalgia    Hepatic steatosis    Hypertension    IBS (irritable bowel syndrome)    Newly diagnosed diabetes ([place]  03/09/2019   Obesity    Panic attack    Torticollis 01/19/2014   Valium overdose    Past Surgical History:  Procedure Laterality Date   big toes ingrown toenail removed     CESAREAN SECTION N/A 05/30/2020   Procedure: CESAREAN SECTION;  Surgeon: Azucena Fallen, MD;  Location: [facility];  Service: Obstetrics;  Laterality: N/A;   DILATATION & CURETTAGE/HYSTEROSCOPY WITH MYOSURE N/A 02/24/2019   Procedure: DILATATION & CURETTAGE/HYSTEROSCOPY WITH MYOSURE;  Surgeon: Azucena Fallen, MD;  Location: [facility];  Service: Gynecology;  Laterality: N/A;   MOUTH  SURGERY     "baby teeth removed,adult teeth  pulled down"   RADIOLOGY WITH ANESTHESIA N/A 06/07/2019   Procedure: MRI CERVICAL SPINE WITHOUT CONTRAST;  Surgeon: Radiologist, Medication, MD;  Location: [facility];  Service: Radiology;  Laterality: N/A;   wisdom teeth     Allergies  Allergen Reactions   Flexeril [Cyclobenzaprine Hcl] Shortness Of Breath   Lorazepam     Other reaction(s): Other Neck spasm   Current Meds  Medication Sig   acetaminophen (TYLENOL) 500 MG tablet Take 2 tablets (1,000 mg total) by mouth every 6 (six) hours.   blood glucose meter kit and supplies KIT Use as directed twice a day   Blood Glucose Monitoring Suppl (CONTOUR NEXT ONE) KIT Use as directed   busPIRone (BUSPAR) 7.5 MG tablet Take 7.5 mg by mouth 3 (three) times daily.   butalbital-acetaminophen-caffeine (FIORICET) 50-325-40 MG tablet Take 1-2 tablets by mouth every 6 (six) hours as needed for headache.   cetirizine (ZYRTEC) 10 MG tablet Take 10 mg by mouth daily.   dicloxacillin (DYNAPEN) 500 MG capsule Take 500 mg by mouth 4 (four) times daily.   famotidine (PEPCID) 20 MG tablet Take 1 tablet (20 mg total) by mouth 2 (two) times daily. Take in morning and at bedtime   furosemide (LASIX) 40 MG tablet Lasix 40 mg once daily for 7 days then every other day.   glucose blood (CONTOUR NEXT TEST) test strip Use as instructed   hydrochlorothiazide (HYDRODIURIL) 25 MG tablet Take 1 tablet (25 mg total) by mouth daily.   ibuprofen (ADVIL) 600 MG tablet Take 1 tablet (600 mg total) by mouth every 6 (six) hours.   iron polysaccharides (NIFEREX) 150 MG capsule Take 1 capsule (150 mg total) by mouth 2 (two) times daily.   labetalol (NORMODYNE) 100 MG tablet Take 200 mg by mouth 3 (three) times daily.   Lancets MISC Use as directed   metFORMIN (GLUCOPHAGE-XR) 750 MG 24 hr tablet Take 750 mg by mouth daily.   omeprazole (PRILOSEC) 40 MG capsule Take 1 capsule (40 mg total) by mouth daily.   sertraline (ZOLOFT) 100 MG  tablet Take 1 tablet by mouth daily.   Social History   Tobacco Use   Smoking status: Former Smoker    Packs/day: 0.50    Years: 4.00    Pack years: 2.00    Types: Cigarettes    Quit date: 01/13/2005    Years since quitting: 15.4   Smokeless tobacco: Never Used  Substance Use Topics   Alcohol use: Not Currently   Family History  Problem Relation Age of Onset   Heart disease Mother    Depression Mother    High blood pressure Mother    Kidney disease Mother    Psychosis Mother    Hearing loss Father    Depression Father    Anxiety disorder Father    Asthma Daughter  Learning disabilities Daughter    Heart disease Maternal Grandfather    Other Brother        fatty liver   Multiple sclerosis Maternal Grandmother    COPD Paternal Grandmother    Heart failure Paternal Grandmother    Heart attack Paternal Grandmother    Stroke Paternal Grandmother    Lung cancer Paternal Grandfather      Review of Systems  Constitutional:  Positive for fatigue. Negative for activity change, appetite change, chills, fever and unexpected weight change.  HENT:  Negative for congestion, ear pain, hearing loss, sinus pressure, sinus pain, sore throat and trouble swallowing.   Eyes:  Negative for pain and visual disturbance.  Respiratory:  Negative for cough, chest tightness, shortness of breath and wheezing.   Cardiovascular:  Negative for chest pain, palpitations and leg swelling (improved).  Gastrointestinal:  Positive for abdominal pain (see above; more along incision). Negative for blood in stool, constipation, diarrhea, nausea and vomiting.  Genitourinary:  Negative for difficulty urinating and menstrual problem (flow has improved; more just spotting now).  Musculoskeletal:  Positive for neck pain and neck stiffness. Negative for arthralgias and back pain.  Skin:  Negative for rash.  Neurological:  Negative for dizziness, weakness, numbness and headaches.  Hematological:  Negative for  adenopathy. Does not bruise/bleed easily.  Psychiatric/Behavioral:  Negative for sleep disturbance and suicidal ideas. The patient is not nervous/anxious.    CBC:  Lab Results  Component Value Date   WBC 5.1 06/07/2020   HGB 7.6 (L) 06/07/2020   HGB 10.5 (L) 04/02/2020   HCT 24.1 (L) 06/07/2020   HCT 30.4 (L) 04/02/2020   MCH 31.8 06/07/2020   MCHC 31.5 06/07/2020   RDW 13.6 06/07/2020   RDW 13.0 04/02/2020   PLT 229 06/07/2020   PLT 191 04/02/2020   MPV 10.3 08/18/2019   CMP: Lab Results  Component Value Date   NA 143 06/07/2020   NA 142 06/05/2020   K 3.5 06/07/2020   CL 108 06/07/2020   CO2 27 06/07/2020   ANIONGAP 8 06/07/2020   GLUCOSE 102 (H) 06/07/2020   BUN 13 06/07/2020   BUN 10 06/05/2020   CREATININE 0.78 06/07/2020   CREATININE 0.73 08/18/2019   GFRAA >60 07/15/2019   CALCIUM 8.0 (L) 06/07/2020   PROT 5.4 (L) 06/07/2020   BILITOT 0.7 06/07/2020   ALKPHOS 51 06/07/2020   ALT 20 06/07/2020   AST 13 (L) 06/07/2020   LIPID:No results found for: CHOL, TRIG, HDL, LDLCALC, LABVLDL  Objective:  BP 116/88 (BP Location: Left Arm, Patient Position: Sitting, Cuff Size: Large)   Pulse (!) 114   Temp 99.4 F (37.4 C) (Oral)   Ht _0  (1.676 m)   Wt 246 lb 12.8 oz (111.9 kg)   SpO2 96%   BMI 39.83 kg/m   Weight: 246 lb 12.8 oz (111.9 kg)   BP Readings from Last 3 Encounters:  06/18/20 116/88  06/15/20 (!) 152/82  06/14/20 139/79   Wt Readings from Last 3 Encounters:  06/18/20 246 lb 12.8 oz (111.9 kg)  06/15/20 250 lb 12.8 oz (113.8 kg)  06/05/20 276 lb (125.2 kg)    Physical Exam Constitutional:      General: She is not in acute distress.    Appearance: She is well-developed.  Cardiovascular:     Rate and Rhythm: Normal rate and regular rhythm.     Heart sounds: Normal heart sounds. No murmur heard.   No friction rub.  Pulmonary:  Effort: Pulmonary effort is normal. No respiratory distress.     Breath sounds: Normal breath sounds. No  wheezing or rales.  Abdominal:     General: Abdomen is protuberant. Bowel sounds are normal.     Palpations: Abdomen is soft.     Tenderness: There is no abdominal tenderness.  Musculoskeletal:     Right lower leg: No edema.     Left lower leg: No edema.     Comments: Neck spasm with repeated pull to left  Skin:    Comments: See picture.  There is some mild erythema laterally along the cesarean section incision line.  There is no active drainage.  No tenderness with palpation.  There is some clear crusting around the incision site.  Neurological:     Mental Status: She is alert and oriented to person, place, and time.  Psychiatric:        Attention and Perception: Attention normal.        Mood and Affect: Affect is blunt, flat and angry.        Speech: Speech normal.        Behavior: Behavior normal.        Thought Content: Thought content does not include suicidal ideation. Thought content does not include suicidal plan.     Comments: Candus is upset about multiple things today including ongoing medical issues, lack of social support.  She has a very negative outlook in terms of follow-up for conditions or making changes in order to get herself in a better situation.  I.e. she has not been driving, but not being able to drive is keeping her staying at her mom's house.  She has not asked her gynecologist about being able to drive so that she can go back to living independently.  She is still paying for her apartment even though she is living with her mother and feels miserable living there.      Assessment/Plan: Health Maintenance Due  Topic Date Due   PNEUMOCOCCAL POLYSACCHARIDE VACCINE AGE 25-64 HIGH RISK  Never done   Pneumococcal Vaccine 2-40 Years old (1 of 4 - PCV13) Never done   FOOT EXAM  Never done   URINE MICROALBUMIN  Never done   Hepatitis C Screening  Never done   HEMOGLOBIN A1C  11/10/2019   TETANUS/TDAP  06/13/2020   Health Maintenance reviewed.  1. Preventative  health care She is working on healthier eating although it is difficult with living situation currently.  Encouraged her to get out of the house and do things like walking so that she can have some time that is quiet and less stressful.  This would be a good time for her to have time alone with her baby.  2. Hypertension, unspecified type Blood pressure is looking much better today.  Continue current medications.  She is following up regularly with cardiology.  Edema is resolved today. - CBC with Differential/Platelet; Future - Comprehensive metabolic panel; Future - Comprehensive metabolic panel - CBC with Differential/Platelet  3. Torticollis This is still severe and causes her continuous discomfort.  I have encouraged her to take the opportunity to follow-up with Dr. Nelva Bush before she goes back to work.  I am hopeful that she will get some improvement with an injection that she previously was unable to get.  She has her contact information.  4. Severe episode of recurrent major depressive disorder, without psychotic features ([place]) Continue with Zoloft 100 mg.  Depression has not been well controlled previously, even when  seeing psychiatry.  I do think she needs to have medication management per psychiatry since previously she was on medication for ADHD.  Depression complicated by lack of support and difficulty with trust/relationships.  5. Anxiety Currently taking BuSpar 7.5 mg 3 times daily.  Anxiety is not well controlled.  She always has some amount of anxiety.  She has been referred from her cardiologist to a new psychologist.  I stressed importance of follow-up with both psychology and psychiatry.  6. Hyperglycemia She continues to take metformin and is working on healthy eating.  We will recheck blood work. - Hemoglobin A1c; Future - Hemoglobin A1c  7. Iron deficiency anemia, unspecified iron deficiency anemia type She did complete an iron infusion and menstrual bleeding has  improved.  We will recheck levels.  Suspect she is doing much better from the standpoint. - Ferritin; Future - Ferritin  8. Vitamin D deficiency - VITAMIN D 25 Hydroxy (Vit-D Deficiency, Fractures); Future - VITAMIN D 25 Hydroxy (Vit-D Deficiency, Fractures)  9. B12 deficiency - Vitamin B12; Future - Vitamin B12  10. Lipid screening - Lipid panel; Future - Lipid panel  Return in about 6 months (around 12/18/2020) for Chronic condition visit.  Micheline Rough, MD   After visit I did reach out to her with resources from the [employer] center in [place].  I do think it would be helpful for her to have some social support outside of her family.  I offered a social work referral, she was somewhat hesitant with having somebody "digging around", but I think it would be nice for her to notice some available community resources to help with things like support, being a single parent, etc.

## 2020-06-19 LAB — LDL CHOLESTEROL, DIRECT: Direct LDL: 103 mg/dL

## 2020-06-19 LAB — COMPREHENSIVE METABOLIC PANEL
ALT: 23 U/L (ref 0–35)
AST: 13 U/L (ref 0–37)
Albumin: 4.6 g/dL (ref 3.5–5.2)
Alkaline Phosphatase: 72 U/L (ref 39–117)
BUN: 15 mg/dL (ref 6–23)
CO2: 27 mEq/L (ref 19–32)
Calcium: 9.5 mg/dL (ref 8.4–10.5)
Chloride: 100 mEq/L (ref 96–112)
Creatinine, Ser: 0.89 mg/dL (ref 0.40–1.20)
GFR: 83.12 mL/min (ref >60.00)
Glucose, Bld: 112 mg/dL — ABNORMAL HIGH (ref 70–99)
Potassium: 3.8 mEq/L (ref 3.5–5.1)
Sodium: 141 mEq/L (ref 135–145)
Total Bilirubin: 0.3 mg/dL (ref 0.2–1.2)
Total Protein: 7.4 g/dL (ref 6.0–8.3)

## 2020-06-19 LAB — LIPID PANEL
Cholesterol: 204 mg/dL — ABNORMAL HIGH (ref 0–200)
HDL: 42.4 mg/dL (ref >39.00)
NonHDL: 161.34
Total CHOL/HDL Ratio: 5
Triglycerides: 396 mg/dL — ABNORMAL HIGH (ref 0.0–149.0)
VLDL: 79.2 mg/dL — ABNORMAL HIGH (ref 0.0–40.0)

## 2020-06-21 ENCOUNTER — Telehealth: Payer: Self-pay

## 2020-06-21 ENCOUNTER — Encounter: Payer: Self-pay | Admitting: Family Medicine

## 2020-06-21 NOTE — Telephone Encounter (Signed)
Spoke with patient regarding results and recommendation.  Patient verbalizes understanding and is agreeable to plan of care. Advised patient to call back with any issues or concerns.  

## 2020-06-21 NOTE — Telephone Encounter (Signed)
-----   Message from Thomasene Ripple, DO sent at 06/06/2020  6:48 PM EDT ----- Creatinine function and electrolytes stable

## 2020-06-22 ENCOUNTER — Telehealth: Payer: Self-pay

## 2020-06-22 LAB — BASIC METABOLIC PANEL
BUN/Creatinine Ratio: 13 (ref 9–23)
BUN: 8 mg/dL (ref 6–20)
CO2: 22 mmol/L (ref 20–29)
Calcium: 8.6 mg/dL — ABNORMAL LOW (ref 8.7–10.2)
Chloride: 105 mmol/L (ref 96–106)
Creatinine, Ser: 0.63 mg/dL (ref 0.57–1.00)
Glucose: 126 mg/dL — ABNORMAL HIGH (ref 65–99)
Potassium: 4.2 mmol/L (ref 3.5–5.2)
Sodium: 143 mmol/L (ref 134–144)
eGFR: 118 mL/min/{1.73_m2} (ref >59)

## 2020-06-22 LAB — MAGNESIUM: Magnesium: 2 mg/dL (ref 1.6–2.3)

## 2020-06-22 NOTE — Telephone Encounter (Signed)
-----   Message from Baldo Daub, MD sent at 06/22/2020 11:56 AM EDT ----- Normal or stable result

## 2020-06-22 NOTE — Telephone Encounter (Signed)
Spoke with patient regarding results and recommendation.  Patient verbalizes understanding and is agreeable to plan of care. Advised patient to call back with any issues or concerns.  

## 2020-07-20 ENCOUNTER — Ambulatory Visit (INDEPENDENT_AMBULATORY_CARE_PROVIDER_SITE_OTHER): Payer: BC Managed Care – PPO | Admitting: Cardiology

## 2020-07-20 ENCOUNTER — Encounter: Payer: Self-pay | Admitting: Cardiology

## 2020-07-20 ENCOUNTER — Other Ambulatory Visit: Payer: Self-pay

## 2020-07-20 VITALS — BP 116/72 | HR 78 | Ht 66.0 in | Wt 239.2 lb

## 2020-07-20 DIAGNOSIS — E119 Type 2 diabetes mellitus without complications: Secondary | ICD-10-CM

## 2020-07-20 DIAGNOSIS — F32A Depression, unspecified: Secondary | ICD-10-CM

## 2020-07-20 LAB — HM DIABETES EYE EXAM

## 2020-07-20 MED ORDER — CARVEDILOL 12.5 MG PO TABS: 12.5000 mg | ORAL_TABLET | Freq: Two times a day (BID) | ORAL | 3 refills | Status: DC

## 2020-07-20 MED ORDER — LABETALOL HCL 200 MG PO TABS: 200.0000 mg | ORAL_TABLET | Freq: Three times a day (TID) | ORAL | 3 refills | Status: DC

## 2020-07-20 NOTE — Patient Instructions (Addendum)
Medication Instructions:  Your physician has recommended you make the following change in your medication:  STOP: Labetalol START: Carvedilol 12.5 mg twice daily  *If you need a refill on your cardiac medications before your next appointment, please call your pharmacy*   Lab Work: None If you have labs (blood work) drawn today and your tests are completely normal, you will receive your results only by: MyChart Message (if you have MyChart) OR A paper copy in the mail If you have any lab test that is abnormal or we need to change your treatment, we will call you to review the results.   Testing/Procedures: None   Follow-Up: At [facility], you and your health needs are our priority.  As part of our continuing mission to provide you with exceptional heart care, we have created designated Provider Care Teams.  These Care Teams include your primary Cardiologist (physician) and Advanced Practice Providers (APPs -  Physician Assistants and Nurse Practitioners) who all work together to provide you with the care you need, when you need it.  We recommend signing up for the patient portal called "MyChart".  Sign up information is provided on this After Visit Summary.  MyChart is used to connect with patients for Virtual Visits (Telemedicine).  Patients are able to view lab/test results, encounter notes, upcoming appointments, etc.  Non-urgent messages can be sent to your provider as well.   To learn more about what you can do with MyChart, go to [email or web address].    Your next appointment:   August 19  The format for your next appointment:   In Person  Provider:   Thomasene Ripple - [facility]    Other Instructions Please take your blood pressure daily.

## 2020-07-20 NOTE — Progress Notes (Signed)
Cardio-Obstetrics Clinic  Follow Up Note   Date:  07/20/2020   ID:  [name], DOB [birth year], MRN [id number]  PCP:  Caren Macadam, MD   [facility] Providers Cardiologist:  Berniece Salines, DO  Electrophysiologist:  None        Referring MD: Caren Macadam, MD   Chief Complaint: " I feel a lot better"   History of Present Illness:    Katie [name] is a 35 y.o. female [G2P2002] who returns for follow up of Katie [name] is a 35 year old female postpartum female with her recent pregnancy complicated by chronic hypertension in pregnancy, paroxysmal supraventricular tachycardia which was suspected to be AVNRT versus atrial tachycardia,Type 2 diabetes mellitus, anxiety, obesity.   We initially saw the patient on April 02, 2020, in our cardio obstetrics clinic due to her cardiovascular risk factors. During her initial visit we ordered an echocardiogram to assess her LV and RV function and for any structural abnormalities.  We also placed a ZIO monitor on the patient- and these results were discussed with her.   She did undergo cesarean section and giving birth to her baby.  She was discharged to home on Jun 02, 2020.  Since her discharge she tells me that she has had significant leg edema. She denies chest pain and shortness of breath. I saw the patient Jun 05, 2020 at that time she appeared to be volume overloaded.  I started her on Lasix to help with diuresis.   I saw the patient on 06/15/2020 at that time, I kept her on the labetalol and added HCTZ 12.5 mg daily. We referred the patient to behavioral health.    Since her visit she has not been able to talk to anyone in behavioral health. Her blood pressure have improved.   She offers no complaints today.       Prior CV Studies Reviewed: The following studies were reviewed today: TTE 09-May-2020 IMPRESSIONS   1. Left ventricular ejection fraction, by estimation, is 60 to 65%. The left  ventricle has normal function. Left ventricular endocardial border not optimally defined to evaluate regional wall motion. There is mild concentric left ventricular  hypertrophy. Left ventricular diastolic parameters are consistent with Grade I diastolic dysfunction (impaired relaxation).   2. Right ventricular systolic function is normal. The right ventricular  size is normal. There is normal pulmonary artery systolic pressure.   3. The mitral valve is normal in structure. No evidence of mitral valve  regurgitation. No evidence of mitral stenosis.   4. The aortic valve is tricuspid. Aortic valve regurgitation is not  visualized. No aortic stenosis is present.   5. The inferior vena cava is normal in size with greater than 50%  respiratory variability, suggesting right atrial pressure of 3 mmHg.   FINDINGS   Left Ventricle: Left ventricular ejection fraction, by estimation, is 60  to 65%. The left ventricle has normal function. Left ventricular  endocardial border not optimally defined to evaluate regional wall motion.  The left ventricular internal cavity  size was normal in size. There is mild concentric left ventricular  hypertrophy. Left ventricular diastolic parameters are consistent with  Grade I diastolic dysfunction (impaired relaxation). Normal left  ventricular filling pressure.   Right Ventricle: The right ventricular size is normal. No increase in right ventricular wall thickness. Right ventricular systolic function is  normal. There is normal pulmonary artery systolic pressure. The tricuspid  regurgitant velocity is 2.42 m/s, and   with an  assumed right atrial pressure of 3 mmHg, the estimated right  ventricular systolic pressure is 81.1 mmHg.   Left Atrium: Left atrial size was normal in size.   Right Atrium: Right atrial size was normal in size.   Pericardium: There is no evidence of pericardial effusion.   Mitral Valve: The mitral valve is normal in structure. No  evidence of mitral valve regurgitation. No evidence of mitral valve stenosis.   Tricuspid Valve: The tricuspid valve is normal in structure. Tricuspid valve regurgitation is trivial. No evidence of tricuspid stenosis.   Aortic Valve: The aortic valve is tricuspid. Aortic valve regurgitation is not visualized. No aortic stenosis is present. Aortic valve mean gradient measures 6.0 mmHg. Aortic valve peak gradient measures 11.3 mmHg. Aortic  valve area, by VTI measures 2.72  cm.   Pulmonic Valve: The pulmonic valve was normal in structure. Pulmonic valve regurgitation is not visualized. No evidence of pulmonic stenosis.   Aorta: The aortic root and ascending aorta are structurally normal, with  no evidence of dilitation and the aortic arch was not well visualized.   Venous: The pulmonary veins were not well visualized. The inferior vena cava is normal in size with greater than 50% respiratory variability, suggesting right atrial pressure of 3 mmHg.   IAS/Shunts: No atrial level shunt detected by color flow Doppler.   Zio monitor The patient wore the monitor for 77 hours starting April 02, 2020. Indication: Palpitations   The minimum heart rate was 64 bpm, maximum heart rate was 149 bpm, and average heart rate was 99 bpm. Predominant underlying rhythm was Sinus Rhythm.     Premature atrial complexes were rare less than 1%. Premature Ventricular complexes were rare less than 1%.   No ventricular tachycardia, no significant pauses, No AV block, no supraventricular tachycardia and no atrial fibrillation present.   6 patient triggered events noted are associated with sinus tachycardia and premature ventricular complex.   Conclusion: Normal/unremarkable study with no evidence of significant arrhythmia  Past Medical History:  Diagnosis Date   ADHD (attention deficit hyperactivity disorder) 03/01/2011   Anxiety    Arthritis    neck    Asthma    as a child   Borderline personality  disorder ([place]) 05/26/2019   Bronchospasm 01/19/2014   Formatting of this note might be different from the original. Prn albuterol, WARI   Chronic neck pain 04/04/2019   Complex atypical endometrial hyperplasia 02/24/2019   Depression    Fibromyalgia    Hepatic steatosis    Hypertension    IBS (irritable bowel syndrome)    Newly diagnosed diabetes ([place]) 03/09/2019   Obesity    Panic attack    Torticollis 01/19/2014   Valium overdose     Past Surgical History:  Procedure Laterality Date   big toes ingrown toenail removed     CESAREAN SECTION N/A 05/30/2020   Procedure: CESAREAN SECTION;  Surgeon: Azucena Fallen, MD;  Location: [place] [facility];  Service: Obstetrics;  Laterality: N/A;   DILATATION & CURETTAGE/HYSTEROSCOPY WITH MYOSURE N/A 02/24/2019   Procedure: DILATATION & CURETTAGE/HYSTEROSCOPY WITH MYOSURE;  Surgeon: Azucena Fallen, MD;  Location: [facility];  Service: Gynecology;  Laterality: N/A;   MOUTH SURGERY     "baby teeth removed,adult teeth  pulled down"   RADIOLOGY WITH ANESTHESIA N/A 06/07/2019   Procedure: MRI CERVICAL SPINE WITHOUT CONTRAST;  Surgeon: Radiologist, Medication, MD;  Location: [place];  Service: Radiology;  Laterality: N/A;   wisdom teeth  OB History     Gravida  2   Para  2   Term  2   Preterm      AB      Living  2      SAB      IAB      Ectopic      Multiple      Live Births  2           { Click here to update OB Charting then refresh note  :1}    Current Medications: Current Meds  Medication Sig   acetaminophen (TYLENOL) 500 MG tablet Take 2 tablets (1,000 mg total) by mouth every 6 (six) hours.   blood glucose meter kit and supplies KIT Use as directed twice a day   Blood Glucose Monitoring Suppl (CONTOUR NEXT ONE) KIT Use as directed   busPIRone (BUSPAR) 7.5 MG tablet Take 7.5 mg by mouth 3 (three) times daily.   butalbital-acetaminophen-caffeine (FIORICET) 50-325-40 MG tablet Take 1-2 tablets by mouth every 6  (six) hours as needed for headache.   cetirizine (ZYRTEC) 10 MG tablet Take 10 mg by mouth daily.   dicloxacillin (DYNAPEN) 500 MG capsule Take 500 mg by mouth 4 (four) times daily.   famotidine (PEPCID) 20 MG tablet Take 1 tablet (20 mg total) by mouth 2 (two) times daily. Take in morning and at bedtime   furosemide (LASIX) 40 MG tablet Lasix 40 mg once daily for 7 days then every other day.   glucose blood (CONTOUR NEXT TEST) test strip Use as instructed   hydrochlorothiazide (HYDRODIURIL) 25 MG tablet Take 1 tablet (25 mg total) by mouth daily.   ibuprofen (ADVIL) 600 MG tablet Take 1 tablet (600 mg total) by mouth every 6 (six) hours.   iron polysaccharides (NIFEREX) 150 MG capsule Take 1 capsule (150 mg total) by mouth 2 (two) times daily.   Lancets MISC Use as directed   metFORMIN (GLUCOPHAGE-XR) 750 MG 24 hr tablet Take 750 mg by mouth daily.   omeprazole (PRILOSEC) 40 MG capsule Take 1 capsule (40 mg total) by mouth daily.   sertraline (ZOLOFT) 100 MG tablet Take 1 tablet by mouth daily.   [DISCONTINUED] carvedilol (COREG) 12.5 MG tablet Take 1 tablet (12.5 mg total) by mouth 2 (two) times daily.   [DISCONTINUED] labetalol (NORMODYNE) 200 MG tablet Take 200 mg by mouth 3 (three) times daily.     Allergies:   Flexeril [cyclobenzaprine hcl] and Lorazepam   Social History   Socioeconomic History   Marital status: Single    Spouse name: Not on file   Number of children: Not on file   Years of education: Not on file   Highest education level: Not on file  Occupational History   Not on file  Tobacco Use   Smoking status: Former    Packs/day: 0.50    Years: 4.00    Pack years: 2.00    Types: Cigarettes    Quit date: 01/13/2005    Years since quitting: 15.5   Smokeless tobacco: Never  Vaping Use   Vaping Use: Never used  Substance and Sexual Activity   Alcohol use: Not Currently   Drug use: No   Sexual activity: Not Currently  Other Topics Concern   Not on file  Social  History Narrative   Not on file   Social Determinants of Health   Financial Resource Strain: Low Risk    Difficulty of Paying Living Expenses: Not hard at all  Food Insecurity: Not on file  Transportation Needs: No Transportation Needs   Lack of Transportation (Medical): No   Lack of Transportation (Non-Medical): No  Physical Activity: Not on file  Stress: Not on file  Social Connections: Not on file      Family History  Problem Relation Age of Onset   Heart disease Mother    Depression Mother    High blood pressure Mother    Kidney disease Mother    Psychosis Mother    Hearing loss Father    Depression Father    Anxiety disorder Father    Asthma Daughter    Learning disabilities Daughter    Heart disease Maternal Grandfather    Other Brother        fatty liver   Multiple sclerosis Maternal Grandmother    COPD Paternal Grandmother    Heart failure Paternal Grandmother    Heart attack Paternal Grandmother    Stroke Paternal Grandmother    Lung cancer Paternal Grandfather       ROS:   Please see the history of present illness.     All other systems reviewed and are negative.   Labs/EKG Reviewed:    EKG:   EKG was not ordered today.   Recent Labs: 06/15/2020: Magnesium 2.0 06/18/2020: ALT 23; BUN 15; Creatinine, Ser 0.89; Hemoglobin 11.9; Platelets 324.0; Potassium 3.8; Sodium 141   Recent Lipid Panel Lab Results  Component Value Date/Time   CHOL 204 (H) 06/18/2020 02:22 PM   TRIG 396.0 (H) 06/18/2020 02:22 PM   HDL 42.40 06/18/2020 02:22 PM   CHOLHDL 5 06/18/2020 02:22 PM   LDLDIRECT 103.0 06/18/2020 02:22 PM    Physical Exam:    VS:  BP 116/72 (BP Location: Left Arm, Patient Position: Sitting, Cuff Size: Large)   Pulse 78   Ht '5\' 6"'  (1.676 m)   Wt 239 lb 3.2 oz (108.5 kg)   SpO2 98%   BMI 38.61 kg/m     Wt Readings from Last 3 Encounters:  07/20/20 239 lb 3.2 oz (108.5 kg)  06/18/20 246 lb 12.8 oz (111.9 kg)  06/15/20 250 lb 12.8 oz (113.8 kg)      GEN:  Well nourished, well developed in no acute distress HEENT: Normal NECK: No JVD; No carotid bruits LYMPHATICS: No lymphadenopathy CARDIAC: RRR, no murmurs, rubs, gallops RESPIRATORY:  Clear to auscultation without rales, wheezing or rhonchi  ABDOMEN: Soft, non-tender, non-distended MUSCULOSKELETAL:  No edema; No deformity  SKIN: Warm and dry NEUROLOGIC:  Alert and oriented x 3 PSYCHIATRIC:  Normal affect    Risk Assessment/Risk Calculators:                 ASSESSMENT & PLAN:    Hypertension  Chronic hypertension in pregnancy Morbid obesity Depression  Type II Diabetes Mellitus   Blood pressure at target on the lower side- I will continue her HCTZ 25 mg daily, will keep her on the labetalol for now as she is breastfeeding- Will transition her off the labetalol to coreg once she is no longer breastfeeding.   With her chronic diabetes she will need to be back on her ACE/ARB in the near future.  Diabetes management per pcp.   I encouraged the patient to continue to exercise.   We again referred the patient to behavorial health- she will continue on her current regimen.    Patient Instructions  Medication Instructions:  Your physician has recommended you make the following change in your medication:  STOP: Labetalol START:  Carvedilol 12.5 mg twice daily  *If you need a refill on your cardiac medications before your next appointment, please call your pharmacy*   Lab Work: None If you have labs (blood work) drawn today and your tests are completely normal, you will receive your results only by: [place] (if you have MyChart) OR A paper copy in the mail If you have any lab test that is abnormal or we need to change your treatment, we will call you to review the results.   Testing/Procedures: None   Follow-Up: At [facility], you and your health needs are our priority.  As part of our continuing mission to provide you with exceptional heart  care, we have created designated Provider Care Teams.  These Care Teams include your primary Cardiologist (physician) and Advanced Practice Providers (APPs -  Physician Assistants and Nurse Practitioners) who all work together to provide you with the care you need, when you need it.  We recommend signing up for the patient portal called "MyChart".  Sign up information is provided on this After Visit Summary.  MyChart is used to connect with patients for Virtual Visits (Telemedicine).  Patients are able to view lab/test results, encounter notes, upcoming appointments, etc.  Non-urgent messages can be sent to your provider as well.   To learn more about what you can do with MyChart, go to [email or web address].    Your next appointment:   August 19  The format for your next appointment:   In Person  Provider:   Berniece Salines - [facility]    Other Instructions Please take your blood pressure daily.    Dispo:  No follow-ups on file.   Medication Adjustments/Labs and Tests Ordered: Current medicines are reviewed at length with the patient today.  Concerns regarding medicines are outlined above.  Tests Ordered: No orders of the defined types were placed in this encounter.  Medication Changes: Meds ordered this encounter  Medications   DISCONTD: carvedilol (COREG) 12.5 MG tablet    Sig: Take 1 tablet (12.5 mg total) by mouth 2 (two) times daily.    Dispense:  180 tablet    Refill:  3

## 2020-07-23 ENCOUNTER — Encounter: Payer: Self-pay | Admitting: Family Medicine

## 2020-07-27 MED ORDER — METOCLOPRAMIDE HCL 10 MG PO TABS: 10.0000 mg | ORAL_TABLET | Freq: Three times a day (TID) | ORAL | 1 refills | Status: DC

## 2020-07-28 ENCOUNTER — Other Ambulatory Visit: Payer: Self-pay | Admitting: Family Medicine

## 2020-07-29 ENCOUNTER — Encounter: Payer: Self-pay | Admitting: Family Medicine

## 2020-07-30 NOTE — Telephone Encounter (Signed)
I called the pt and scheduled a virtual visit on 7/19 with Dr [place] as there were no openings available with any other providers here today.

## 2020-07-30 NOTE — Telephone Encounter (Signed)
I called the Katie [name] and informed her a visit is needed for evaluation prior to medications being sent in.  I offered an appt for today at 4:30pm for a virtual with Dr [place] instead as this spot just opened up.  Patient declined and stated she would prefer to await the visit on 7/19.

## 2020-07-31 ENCOUNTER — Telehealth (INDEPENDENT_AMBULATORY_CARE_PROVIDER_SITE_OTHER): Payer: BC Managed Care – PPO | Admitting: Family Medicine

## 2020-07-31 ENCOUNTER — Encounter: Payer: Self-pay | Admitting: Family Medicine

## 2020-07-31 VITALS — Ht 66.0 in

## 2020-07-31 DIAGNOSIS — R112 Nausea with vomiting, unspecified: Secondary | ICD-10-CM | POA: Diagnosis not present

## 2020-07-31 DIAGNOSIS — U071 COVID-19: Secondary | ICD-10-CM

## 2020-07-31 DIAGNOSIS — J4520 Mild intermittent asthma, uncomplicated: Secondary | ICD-10-CM

## 2020-07-31 MED ORDER — NIRMATRELVIR/RITONAVIR (PAXLOVID)TABLET: 3.0000 | ORAL_TABLET | Freq: Two times a day (BID) | ORAL | 0 refills | Status: AC

## 2020-07-31 MED ORDER — ALBUTEROL SULFATE HFA 108 (90 BASE) MCG/ACT IN AERS: 2.0000 | INHALATION_SPRAY | Freq: Four times a day (QID) | RESPIRATORY_TRACT | 0 refills | Status: DC | PRN

## 2020-07-31 MED ORDER — ONDANSETRON HCL 4 MG PO TABS: 4.0000 mg | ORAL_TABLET | Freq: Three times a day (TID) | ORAL | 0 refills | Status: DC | PRN

## 2020-07-31 NOTE — Progress Notes (Signed)
Virtual Visit via Video Note I connected with Katie [name] on 07/31/20 by a video enabled telemedicine application and verified that I am speaking with the correct person using two identifiers.  Location patient: home Location provider:work office Persons participating in the virtual visit: patient, provider  I discussed the limitations of evaluation and management by telemedicine and the availability of in person appointments. The patient expressed understanding and agreed to proceed.  Chief Complaint  Patient presents with   Covid Positive   Nausea   Cough   Sore Throat   HPI: [name] is a 35 yo female with hx of anxiety,asthma,HTN,IBS,and DM I c/o 4-5 days of symptoms described above. Started with "scratchy" throat, day 2nd "extremely tired", nausea,vomiting,and diarrhea. Subjective fever,chills,and body aches. Productive cough , negative for hemoptysis. Coughing helps with chest congestion.  Negative for anosmia and [place] but states that smell and taste are "not normal." Today 3 loose stools and 2 episodes of vomiting. Exacerbated by food intake.  Negative for CP,SOB,palpitations,wheezing,abdominal pain,urinary symptoms ,or skin rash. She has skin pruritus and notes erythema after scratching. She has not noted ecchymosis. 3 days ago positive home COVID 19 test x 2, different brand and same day. Negative for sick contact or recent travel.  She has not taken OTC medications. COVID 19 vaccination completed +booster x 1.  She has a 2 months baby, staying with her parents. She is breastfeeding.  Asthma: She has not had an exacerbation in a while. She would like a refill for Albuterol inh.  ROS: See pertinent positives and negatives per HPI.  Past Medical History:  Diagnosis Date   ADHD (attention deficit hyperactivity disorder) 03/01/2011   Anxiety    Arthritis    neck    Asthma    as a child   Borderline personality disorder ([place]) 05/26/2019   Bronchospasm 01/19/2014    Formatting of this note might be different from the original. Prn albuterol, [facility]   Chronic neck pain 04/04/2019   Complex atypical endometrial hyperplasia 02/24/2019   Depression    Fibromyalgia    Hepatic steatosis    Hypertension    IBS (irritable bowel syndrome)    Newly diagnosed diabetes ([place]) 03/09/2019   Obesity    Panic attack    Torticollis 01/19/2014   Valium overdose     Past Surgical History:  Procedure Laterality Date   big toes ingrown toenail removed     CESAREAN SECTION N/A 05/30/2020   Procedure: CESAREAN SECTION;  Surgeon: Azucena Fallen, MD;  Location: [facility];  Service: Obstetrics;  Laterality: N/A;   DILATATION & CURETTAGE/HYSTEROSCOPY WITH MYOSURE N/A 02/24/2019   Procedure: DILATATION & CURETTAGE/HYSTEROSCOPY WITH MYOSURE;  Surgeon: Azucena Fallen, MD;  Location[facility];  Service: Gynecology;  Laterality: N/A;   MOUTH SURGERY     "baby teeth removed,adult teeth  pulled down"   RADIOLOGY WITH ANESTHESIA N/A 06/07/2019   Procedure: MRI CERVICAL SPINE WITHOUT CONTRAST;  Surgeon: Radiologist, Medication, MD;  Location: [facility];  Service: Radiology;  Laterality: N/A;   wisdom teeth      Family History  Problem Relation Age of Onset   Heart disease Mother    Depression Mother    High blood pressure Mother    Kidney disease Mother    Psychosis Mother    Hearing loss Father    Depression Father    Anxiety disorder Father    Asthma Daughter    Learning disabilities Daughter    Heart disease Maternal Grandfather  Other Brother        fatty liver   Multiple sclerosis Maternal Grandmother    COPD Paternal Grandmother    Heart failure Paternal Grandmother    Heart attack Paternal Grandmother    Stroke Paternal Grandmother    Lung cancer Paternal Grandfather    Social History   Socioeconomic History   Marital status: Single    Spouse name: Not on file   Number of children: Not on file   Years of education: Not on file   Highest  education level: Not on file  Occupational History   Not on file  Tobacco Use   Smoking status: Former    Packs/day: 0.50    Years: 4.00    Pack years: 2.00    Types: Cigarettes    Quit date: 01/13/2005    Years since quitting: 15.5   Smokeless tobacco: Never  Vaping Use   Vaping Use: Never used  Substance and Sexual Activity   Alcohol use: Not Currently   Drug use: No   Sexual activity: Not Currently  Other Topics Concern   Not on file  Social History Narrative   Not on file   Social Determinants of Health   Financial Resource Strain: Low Risk    Difficulty of Paying Living Expenses: Not hard at all  Food Insecurity: Not on file  Transportation Needs: No Transportation Needs   Lack of Transportation (Medical): No   Lack of Transportation (Non-Medical): No  Physical Activity: Not on file  Stress: Not on file  Social Connections: Not on file  Intimate Partner Violence: Not on file    Current Outpatient Medications:    acetaminophen (TYLENOL) 500 MG tablet, Take 2 tablets (1,000 mg total) by mouth every 6 (six) hours., Disp: 30 tablet, Rfl: 0   blood glucose meter kit and supplies KIT, Use as directed twice a day, Disp: 1 each, Rfl: 0   Blood Glucose Monitoring Suppl (CONTOUR NEXT ONE) KIT, Use as directed, Disp: 1 kit, Rfl: 0   busPIRone (BUSPAR) 7.5 MG tablet, Take 7.5 mg by mouth 3 (three) times daily., Disp: , Rfl:    butalbital-acetaminophen-caffeine (FIORICET) 50-325-40 MG tablet, Take 1-2 tablets by mouth every 6 (six) hours as needed for headache., Disp: 20 tablet, Rfl: 0   cetirizine (ZYRTEC) 10 MG tablet, Take 10 mg by mouth daily., Disp: , Rfl:    famotidine (PEPCID) 20 MG tablet, TAKE ONE TABLET BY MOUTH  IN THE MORNING AND TAKE ONE TABLET BY MOUTH AT BEDTIME, Disp: 60 tablet, Rfl: 2   furosemide (LASIX) 40 MG tablet, Lasix 40 mg once daily for 7 days then every other day., Disp: 90 tablet, Rfl: 3   glucose blood (CONTOUR NEXT TEST) test strip, Use as instructed,  Disp: 100 each, Rfl: 3   hydrochlorothiazide (HYDRODIURIL) 25 MG tablet, Take 1 tablet (25 mg total) by mouth daily., Disp: 90 tablet, Rfl: 3   ibuprofen (ADVIL) 600 MG tablet, Take 1 tablet (600 mg total) by mouth every 6 (six) hours., Disp: 30 tablet, Rfl: 0   iron polysaccharides (NIFEREX) 150 MG capsule, Take 1 capsule (150 mg total) by mouth 2 (two) times daily., Disp: 30 capsule, Rfl: 0   labetalol (NORMODYNE) 200 MG tablet, Take 1 tablet (200 mg total) by mouth 3 (three) times daily., Disp: 275 tablet, Rfl: 3   Lancets MISC, Use as directed, Disp: 100 each, Rfl: 0   metFORMIN (GLUCOPHAGE-XR) 750 MG 24 hr tablet, Take 750 mg by mouth daily., Disp: ,  Rfl:    metoCLOPramide (REGLAN) 10 MG tablet, Take 1 tablet (10 mg total) by mouth 3 (three) times daily before meals., Disp: 270 tablet, Rfl: 1   omeprazole (PRILOSEC) 40 MG capsule, Take 1 capsule (40 mg total) by mouth daily., Disp: 30 capsule, Rfl: 3   sertraline (ZOLOFT) 100 MG tablet, Take 1 tablet by mouth daily., Disp: , Rfl:   EXAM:  VITALS per patient if applicable:Ht '5\' 6"'  (1.676 m)   BMI 38.61 kg/m   GENERAL: alert, oriented, appears well and in no acute distress  HEENT: atraumatic, conjunctiva clear, no obvious abnormalities on inspection of external nose and ears Nasal voice.  NECK: normal movements of the head and neck  LUNGS: on inspection no signs of respiratory distress, breathing rate appears normal, no obvious gross SOB, gasping or wheezing  CV: no obvious cyanosis  MS: moves all visible extremities without noticeable abnormality  PSYCH/NEURO: pleasant and cooperative, no obvious depression or anxiety, speech and thought processing grossly intact  ASSESSMENT AND PLAN:  Discussed the following assessment and plan:  COVID-19 virus infection - Plan: nirmatrelvir/ritonavir EUA (PAXLOVID) TABS Educated about Dx,possible complications,and treatment options. Mild to moderate disease with risk of complications, so  recommend oral antiviral. We reviewed all side effects of Paxlovid and medication of medication interaction. No data available in regard to breast feeding, no absolutely recommended, risk vs benefits discussed. 7 days quarantine ,starting from symptoms onset. Oral hydration, Gatorade or Pedialyte are good options. Bland and light diet as tolerated. Clearly instructed about warning signs.  Nausea and vomiting in adult - Plan: ondansetron (ZOFRAN) 4 MG tablet Symptomatic treatment with Zofran 4 mg tid prn as needed. Adequate hydration.  Mild intermittent asthma without complication - Plan: albuterol (VENTOLIN HFA) 108 (90 Base) MCG/ACT inhaler Currently she is not having asthma exacerbation like symptoms. Albuterol inh 2 puff qid prn, if wheezing she can take Albuterol inh 2 puff every 6 hours for a week then as needed for wheezing or shortness of breath.   We discussed possible serious and likely etiologies, options for evaluation and workup, limitations of telemedicine visit vs in person visit, treatment, treatment risks and precautions. The patient was advised to call back or seek an in-person evaluation if the symptoms worsen or if the condition fails to improve as anticipated. I discussed the assessment and treatment plan with the patient. Katie [name] was provided an opportunity to ask questions and all were answered. She agreed with the plan and demonstrated an understanding of the instructions.  Return if symptoms worsen or fail to improve.  Willet Schleifer [place], MD

## 2020-08-01 ENCOUNTER — Telehealth: Payer: Self-pay

## 2020-08-01 ENCOUNTER — Encounter: Payer: Self-pay | Admitting: Family Medicine

## 2020-08-01 NOTE — Telephone Encounter (Signed)
Patient called and asked about her vomiting she noted on her MyChart Covid Questionnaire. She says she vomited once today, she's able to keep liquids down. I advised clear liquids, monitor urine output, if unable to drink, if vomiting worsens to go to the ED/UC or call PCP office to let them know she's still vomiting. Patient verbalized understanding.  This patient reported symptoms of new or worsening shortness of breath or vomiting in MyChart today.

## 2020-08-03 ENCOUNTER — Encounter: Payer: Self-pay | Admitting: Family Medicine

## 2020-08-07 ENCOUNTER — Encounter: Payer: Self-pay | Admitting: Family Medicine

## 2020-08-09 ENCOUNTER — Encounter: Payer: Self-pay | Admitting: Family Medicine

## 2020-08-14 ENCOUNTER — Encounter: Payer: Self-pay | Admitting: Family Medicine

## 2020-08-17 MED ORDER — METFORMIN HCL ER 750 MG PO TB24: 750.0000 mg | ORAL_TABLET | Freq: Every day | ORAL | 2 refills | Status: DC

## 2020-08-26 ENCOUNTER — Other Ambulatory Visit: Payer: Self-pay | Admitting: Family Medicine

## 2020-08-29 ENCOUNTER — Encounter: Payer: Self-pay | Admitting: Family Medicine

## 2020-08-31 ENCOUNTER — Ambulatory Visit: Payer: BC Managed Care – PPO | Admitting: Cardiology

## 2020-08-31 ENCOUNTER — Telehealth: Payer: Self-pay

## 2020-08-31 NOTE — Telephone Encounter (Signed)
I sent a message requesting appointment. I would like to talk with her before ordering bloodwork.

## 2020-08-31 NOTE — Telephone Encounter (Signed)
Patient called about message sent on MyChart and want a response to see if she needs to come in and have labs drawn to check her blood count levels. Pt requested a call back.

## 2020-09-03 NOTE — Telephone Encounter (Signed)
Please see prior Mychart message. 

## 2020-09-07 ENCOUNTER — Encounter: Payer: Self-pay | Admitting: Family Medicine

## 2020-09-07 ENCOUNTER — Telehealth (INDEPENDENT_AMBULATORY_CARE_PROVIDER_SITE_OTHER): Payer: BC Managed Care – PPO | Admitting: Family Medicine

## 2020-09-07 DIAGNOSIS — J4520 Mild intermittent asthma, uncomplicated: Secondary | ICD-10-CM | POA: Diagnosis not present

## 2020-09-07 DIAGNOSIS — R5383 Other fatigue: Secondary | ICD-10-CM | POA: Diagnosis not present

## 2020-09-07 DIAGNOSIS — D649 Anemia, unspecified: Secondary | ICD-10-CM | POA: Diagnosis not present

## 2020-09-07 DIAGNOSIS — I10 Essential (primary) hypertension: Secondary | ICD-10-CM | POA: Diagnosis not present

## 2020-09-07 DIAGNOSIS — E611 Iron deficiency: Secondary | ICD-10-CM

## 2020-09-07 DIAGNOSIS — E781 Pure hyperglyceridemia: Secondary | ICD-10-CM

## 2020-09-07 DIAGNOSIS — E559 Vitamin D deficiency, unspecified: Secondary | ICD-10-CM

## 2020-09-07 DIAGNOSIS — E119 Type 2 diabetes mellitus without complications: Secondary | ICD-10-CM

## 2020-09-07 NOTE — Progress Notes (Signed)
Virtual Visit via Video Note  I connected with Katie [name]  on 09/07/20 at 11:30 AM EDT by a video enabled telemedicine application and verified that I am speaking with the correct person using two identifiers.  Location patient: home Location provider: [place], [place] [address] Persons participating in the virtual visit: patient, provider  I discussed the limitations of evaluation and management by telemedicine and the availability of in person appointments. The patient expressed understanding and agreed to proceed.   Katie [name] DOB: Nov 17, 1985 Encounter date: 09/07/2020  This is a 35 y.o. female who presents with Chief Complaint  Patient presents with   Fatigue    X2 weeks    History of present illness:  She is tired all the time. Sometimes hard to even stay awake at work. Infant is sleeping through the night - she is going to bed as early as 5:30 and getting even 12 hours, but feels tired as soon as she gets up. Currently working about 55 hours/week. Was 80-90/week. Working days 6am-2pm.   Had one period in June, but didn't have one last month. Some light spotting in last couple of days. No chance of pregnancy.   Eating what is "there". Not all healthy. Caffeine doesn't even help.   Not sure if snoring; suspects she does. She is living alone now; but mom has told her that she stops breathing in past. Passed sleep study when she did this in past.   Couple of headaches. Not sure about current weight or blood pressures.   Not checking blood sugars.   She is taking prenatal vitamin.   Breathing has been doing ok.   Dealing with mold in apartment. States that they tried to work on it but textured ceiling limited cleaning process.   Allergies  Allergen Reactions   Flexeril [Cyclobenzaprine Hcl] Shortness Of Breath   Lorazepam     Other reaction(s): Other Neck spasm   Current Meds  Medication Sig    acetaminophen (TYLENOL) 500 MG tablet Take 2 tablets (1,000 mg total) by mouth every 6 (six) hours.   albuterol (VENTOLIN HFA) 108 (90 Base) MCG/ACT inhaler Inhale 2 puffs into the lungs every 6 (six) hours as needed for wheezing or shortness of breath.   blood glucose meter kit and supplies KIT Use as directed twice a day   Blood Glucose Monitoring Suppl (CONTOUR NEXT ONE) KIT Use as directed   busPIRone (BUSPAR) 7.5 MG tablet Take 7.5 mg by mouth 3 (three) times daily.   butalbital-acetaminophen-caffeine (FIORICET) 50-325-40 MG tablet Take 1-2 tablets by mouth every 6 (six) hours as needed for headache.   cetirizine (ZYRTEC) 10 MG tablet Take 10 mg by mouth daily.   famotidine (PEPCID) 20 MG tablet TAKE ONE TABLET BY MOUTH  IN THE MORNING AND TAKE ONE TABLET BY MOUTH AT BEDTIME   Ferrous Sulfate (IRON PO) Take by mouth daily.   furosemide (LASIX) 40 MG tablet Lasix 40 mg once daily for 7 days then every other day. (Patient taking differently: Take 40 mg by mouth every other day.)   glucose blood (CONTOUR NEXT TEST) test strip Use as instructed   hydrochlorothiazide (HYDRODIURIL) 25 MG tablet Take 1 tablet (25 mg total) by mouth daily.   ibuprofen (ADVIL) 600 MG tablet Take 1 tablet (600 mg total) by mouth every 6 (six) hours. (Patient taking differently: Take 600 mg by mouth as needed.)   labetalol (NORMODYNE) 200 MG tablet Take 1  tablet (200 mg total) by mouth 3 (three) times daily.   Lancets MISC Use as directed   metFORMIN (GLUCOPHAGE XR) 750 MG 24 hr tablet Take 1 tablet (750 mg total) by mouth daily with breakfast.   metFORMIN (GLUCOPHAGE-XR) 750 MG 24 hr tablet Take 750 mg by mouth daily.   metoCLOPramide (REGLAN) 10 MG tablet Take 1 tablet (10 mg total) by mouth 3 (three) times daily before meals.   omeprazole (PRILOSEC) 40 MG capsule TAKE ONE CAPSULE BY MOUTH DAILY   ondansetron (ZOFRAN) 4 MG tablet Take 1 tablet (4 mg total) by mouth every 8 (eight) hours as needed for nausea or  vomiting.   sertraline (ZOLOFT) 100 MG tablet Take 1 tablet by mouth daily.    Review of Systems  Constitutional:  Positive for fatigue. Negative for chills and fever.  HENT:  Negative for congestion, sinus pressure and sinus pain.   Respiratory:  Negative for cough, chest tightness, shortness of breath and wheezing.   Cardiovascular:  Negative for chest pain, palpitations and leg swelling.  Gastrointestinal:  Negative for abdominal pain.  Genitourinary:  Negative for difficulty urinating and dysuria.       States no chance of pregnancy   Neurological:  Negative for dizziness and light-headedness. Headaches: occasional. Psychiatric/Behavioral:         Mood has been stable; she feels she is accepting of things a little easier and letting things not get to her as much.    Objective:  LMP  (LMP Unknown)       BP Readings from Last 3 Encounters:  07/20/20 116/72  06/18/20 116/88  06/15/20 (!) 152/82   Wt Readings from Last 3 Encounters:  07/20/20 239 lb 3.2 oz (108.5 kg)  06/18/20 246 lb 12.8 oz (111.9 kg)  06/15/20 250 lb 12.8 oz (113.8 kg)    EXAM:  GENERAL: alert, oriented, appears well and in no acute distress  HEENT: atraumatic, conjunctiva clear, no obvious abnormalities on inspection of external nose and ears  NECK: limited movement due to torticollis  LUNGS: on inspection no signs of respiratory distress, breathing rate appears normal, no obvious gross SOB, gasping or wheezing  CV: no obvious cyanosis  MS: moves all visible extremities without noticeable abnormality  PSYCH/NEURO: pleasant and cooperative, no obvious depression or anxiety, speech and thought processing grossly intact   Assessment/Plan  1. Fatigue, unspecified type We are going to start with repeat bloodwork; encouraged her to check blood pressures at home as well as sugars. We are not certain source of fatigue and we need these data points to make decisions; see below.  - CBC with  Differential/Platelet; Future - Comprehensive metabolic panel; Future - TSH; Future - T4, free; Future - T3, free; Future  2. Anemia, unspecified type Recheck ferritin. She has not had significant menstrual bleeding since last bloodwork; so I would expect these to be stable. She continues with prenatal vitamin.  - Ferritin; Future  3. Hypertension, unspecified type Uncertain how pressures are doing. She will check over weekend and let me know.   4. Mild intermittent asthma without complication Breathing has been stable.   5. Type 2 diabetes mellitus without complication, without long-term current use of insulin ([place]) She has not used insulin since delivery; continues with metformin. Advised checking sugars at home and reporting back to me Monday - fasting and 2 hour post prandials through weekend. Call if over 200.  - Hemoglobin A1c; Future  6. Hypertriglyceridemia Recheck lipids. She has not been eating as  well as she would like.  - Lipid panel; Future  7. Vitamin D deficiency - VITAMIN D 25 Hydroxy (Vit-D Deficiency, Fractures); Future  8. Iron deficiency See above.     I discussed the assessment and treatment plan with the patient. The patient was provided an opportunity to ask questions and all were answered. The patient agreed with the plan and demonstrated an understanding of the instructions.   The patient was advised to call back or seek an in-person evaluation if the symptoms worsen or if the condition fails to improve as anticipated.  I provided 30 minutes of non-face-to-face time during this encounter.   Micheline Rough, MD

## 2020-09-21 ENCOUNTER — Encounter: Payer: Self-pay | Admitting: Family Medicine

## 2020-09-25 NOTE — Telephone Encounter (Signed)
Patient stated the area is still bothering her and she has an appointment with her GYN tomorrow.  Message sent to PCP.

## 2020-09-25 NOTE — Telephone Encounter (Signed)
noted 

## 2020-10-02 ENCOUNTER — Encounter: Payer: Self-pay | Admitting: Family Medicine

## 2020-10-03 ENCOUNTER — Encounter: Payer: Self-pay | Admitting: Family Medicine

## 2020-10-03 ENCOUNTER — Other Ambulatory Visit: Payer: Self-pay

## 2020-10-03 ENCOUNTER — Ambulatory Visit (INDEPENDENT_AMBULATORY_CARE_PROVIDER_SITE_OTHER): Payer: BC Managed Care – PPO | Admitting: Family Medicine

## 2020-10-03 VITALS — BP 124/80 | HR 101 | Temp 97.9°F | Ht 66.0 in | Wt 255.4 lb

## 2020-10-03 DIAGNOSIS — I10 Essential (primary) hypertension: Secondary | ICD-10-CM

## 2020-10-03 DIAGNOSIS — R5383 Other fatigue: Secondary | ICD-10-CM | POA: Diagnosis not present

## 2020-10-03 DIAGNOSIS — E119 Type 2 diabetes mellitus without complications: Secondary | ICD-10-CM | POA: Diagnosis not present

## 2020-10-03 DIAGNOSIS — E559 Vitamin D deficiency, unspecified: Secondary | ICD-10-CM | POA: Diagnosis not present

## 2020-10-03 DIAGNOSIS — E781 Pure hyperglyceridemia: Secondary | ICD-10-CM | POA: Diagnosis not present

## 2020-10-03 DIAGNOSIS — G5601 Carpal tunnel syndrome, right upper limb: Secondary | ICD-10-CM

## 2020-10-03 DIAGNOSIS — D649 Anemia, unspecified: Secondary | ICD-10-CM | POA: Diagnosis not present

## 2020-10-03 DIAGNOSIS — Z23 Encounter for immunization: Secondary | ICD-10-CM

## 2020-10-03 NOTE — Patient Instructions (Signed)
Wear carpal tunnel brace day/night x 2 weeks. Let me know if not improved after this.

## 2020-10-03 NOTE — Progress Notes (Signed)
Katie [name] [place] DOB: 04/22/1985 Encounter date: 10/03/2020  This is a 35 y.o. female who presents with Chief Complaint  Patient presents with   Fatigue   Injections    Patient requests injection for right carpal tunnel syndrome     History of present illness: Last visit was virtual and about 1 month ago. Encouraged to monitor blood pressure and sugars at home due to complaints of fatigue. Baseline bloodwork was ordered at that visit but not completed.   Took sugar one time - was 140's.  Blood pressure has been ok; not as low as today.   She is working doubles now. Her mom is helping with child care. She is working up to 80 hours/week.   Doesn't feel like heart is racing although HR is up a little today.   She did get mastitis -saw doc a week ago. Didn't get to pump for an extended time at work.    Wrist started bothering her again a month ago. Wore brace some of the time. Did well after last injection. Wondering about getting another one.  Last injection was 04/2020.  Pain is not intermittent.  She notes at nighttime and with driving especially, but also sometimes at work.  Reflux is doing better overall. She does get some reflux pain on occasion.    Allergies  Allergen Reactions   Flexeril [Cyclobenzaprine Hcl] Shortness Of Breath   Lorazepam     Other reaction(s): Other Neck spasm   Current Meds  Medication Sig   acetaminophen (TYLENOL) 500 MG tablet Take 2 tablets (1,000 mg total) by mouth every 6 (six) hours.   albuterol (VENTOLIN HFA) 108 (90 Base) MCG/ACT inhaler Inhale 2 puffs into the lungs every 6 (six) hours as needed for wheezing or shortness of breath.   blood glucose meter kit and supplies KIT Use as directed twice a day   Blood Glucose Monitoring Suppl (CONTOUR NEXT ONE) KIT Use as directed   busPIRone (BUSPAR) 7.5 MG tablet Take 7.5 mg by mouth 3 (three) times daily.   butalbital-acetaminophen-caffeine (FIORICET) 50-325-40 MG tablet Take 1-2 tablets  by mouth every 6 (six) hours as needed for headache.   cetirizine (ZYRTEC) 10 MG tablet Take 10 mg by mouth daily.   famotidine (PEPCID) 20 MG tablet TAKE ONE TABLET BY MOUTH  IN THE MORNING AND TAKE ONE TABLET BY MOUTH AT BEDTIME   Ferrous Sulfate (IRON PO) Take by mouth daily.   furosemide (LASIX) 40 MG tablet Lasix 40 mg once daily for 7 days then every other day. (Patient taking differently: Take 40 mg by mouth every other day.)   glucose blood (CONTOUR NEXT TEST) test strip Use as instructed   ibuprofen (ADVIL) 600 MG tablet Take 1 tablet (600 mg total) by mouth every 6 (six) hours. (Patient taking differently: Take 600 mg by mouth as needed.)   labetalol (NORMODYNE) 200 MG tablet Take 1 tablet (200 mg total) by mouth 3 (three) times daily.   Lancets MISC Use as directed   metFORMIN (GLUCOPHAGE XR) 750 MG 24 hr tablet Take 1 tablet (750 mg total) by mouth daily with breakfast.   metoCLOPramide (REGLAN) 10 MG tablet Take 1 tablet (10 mg total) by mouth 3 (three) times daily before meals.   omeprazole (PRILOSEC) 40 MG capsule TAKE ONE CAPSULE BY MOUTH DAILY   ondansetron (ZOFRAN) 4 MG tablet Take 1 tablet (4 mg total) by mouth every 8 (eight) hours as needed for nausea or vomiting.   sertraline (ZOLOFT) 100 MG  tablet Take 1 tablet by mouth daily.    Review of Systems  Constitutional:  Positive for fatigue. Negative for chills and fever.  Respiratory:  Negative for cough, chest tightness, shortness of breath and wheezing.   Cardiovascular:  Negative for chest pain, palpitations and leg swelling.   Objective:  BP 102/70 (BP Location: Left Arm, Patient Position: Sitting, Cuff Size: Large)   Pulse (!) 101   Temp 97.9 F (36.6 C) (Oral)   Ht _0  (1.676 m)   Wt 255 lb 6.4 oz (115.8 kg)   LMP  (LMP Unknown)   SpO2 95%   BMI 41.22 kg/m   Weight: 255 lb 6.4 oz (115.8 kg)   BP Readings from Last 3 Encounters:  10/03/20 102/70  07/20/20 116/72  06/18/20 116/88   Wt Readings from Last  3 Encounters:  10/03/20 255 lb 6.4 oz (115.8 kg)  07/20/20 239 lb 3.2 oz (108.5 kg)  06/18/20 246 lb 12.8 oz (111.9 kg)    Physical Exam Constitutional:      General: She is not in acute distress.    Appearance: She is well-developed.  Neck:     Comments: Torticollis with significant muscle spasm of neck limited mobility Cardiovascular:     Rate and Rhythm: Normal rate and regular rhythm.     Heart sounds: Normal heart sounds. No murmur heard.   No friction rub.  Pulmonary:     Effort: Pulmonary effort is normal. No respiratory distress.     Breath sounds: Normal breath sounds. No wheezing or rales.  Musculoskeletal:     Right lower leg: No edema.     Left lower leg: No edema.  Neurological:     Mental Status: She is alert and oriented to person, place, and time.  Psychiatric:        Behavior: Behavior normal.    Assessment/Plan 1. Fatigue, unspecified type Will have her complete blood work today that was ordered at her last virtual visit.  Some of her fatigue is likely from working a significant amount of hours, caring for her newborn.  She does have help with childcare from her mom, although this relationship has been strained in the past. - T3, free - T4, free - TSH - Comprehensive metabolic panel - CBC with Differential/Platelet  2. Type 2 diabetes mellitus without complication, without long-term current use of insulin ([place]) She is not regularly checking her sugars at home.  She does continue to take metformin.  We will get an A1c and see how her overall sugar control is. - Hemoglobin A1c  3. Hypertension, unspecified type Blood pressure normal today.  She is following with cardiology for blood pressure management and states that they have prefer to keep her on labetalol until she is done breast-feeding.  4. Carpal tunnel syndrome of right wrist Pain is started in the last month.  She is not regularly wearing her wrist brace.  We discussed using ergonomic mouse (which  she already has) at work, and wearing wrist brace day and night.  If she does not have improvement with regular wearing of brace we can consider another injection.  We discussed that surgical referral would also be warranted, especially if symptoms become more persistent.  5. Need for immunization against influenza - Flu Vaccine QUAD 6+ mos PF IM (Fluarix Quad PF)  6. Vitamin D deficiency - VITAMIN D 25 Hydroxy (Vit-D Deficiency, Fractures)  7. Anemia, unspecified type - Ferritin  8. Hypertriglyceridemia - Lipid panel   Return for  pending bloodwork.    Micheline Rough, MD

## 2020-10-04 LAB — LDL CHOLESTEROL, DIRECT: Direct LDL: 89 mg/dL

## 2020-10-04 LAB — CBC WITH DIFFERENTIAL/PLATELET
Basophils Absolute: 0 10*3/uL (ref 0.0–0.1)
Basophils Relative: 0.5 % (ref 0.0–3.0)
Eosinophils Absolute: 0.2 10*3/uL (ref 0.0–0.7)
Eosinophils Relative: 2.6 % (ref 0.0–5.0)
HCT: 36.2 % (ref 36.0–46.0)
Hemoglobin: 12.2 g/dL (ref 12.0–15.0)
Lymphocytes Relative: 22.4 % (ref 12.0–46.0)
Lymphs Abs: 1.5 10*3/uL (ref 0.7–4.0)
MCHC: 33.8 g/dL (ref 30.0–36.0)
MCV: 88.9 fl (ref 78.0–100.0)
Monocytes Absolute: 0.4 10*3/uL (ref 0.1–1.0)
Monocytes Relative: 6.3 % (ref 3.0–12.0)
Neutro Abs: 4.5 10*3/uL (ref 1.4–7.7)
Neutrophils Relative %: 68.2 % (ref 43.0–77.0)
Platelets: 208 10*3/uL (ref 150.0–400.0)
RBC: 4.07 Mil/uL (ref 3.87–5.11)
RDW: 14.4 % (ref 11.5–15.5)
WBC: 6.6 10*3/uL (ref 4.0–10.5)

## 2020-10-04 LAB — COMPREHENSIVE METABOLIC PANEL
ALT: 26 U/L (ref 0–35)
AST: 20 U/L (ref 0–37)
Albumin: 4.5 g/dL (ref 3.5–5.2)
Alkaline Phosphatase: 59 U/L (ref 39–117)
BUN: 12 mg/dL (ref 6–23)
CO2: 26 mEq/L (ref 19–32)
Calcium: 9.7 mg/dL (ref 8.4–10.5)
Chloride: 103 mEq/L (ref 96–112)
Creatinine, Ser: 0.9 mg/dL (ref 0.40–1.20)
GFR: 81.85 mL/min (ref >60.00)
Glucose, Bld: 99 mg/dL (ref 70–99)
Potassium: 4 mEq/L (ref 3.5–5.1)
Sodium: 141 mEq/L (ref 135–145)
Total Bilirubin: 0.3 mg/dL (ref 0.2–1.2)
Total Protein: 7.4 g/dL (ref 6.0–8.3)

## 2020-10-04 LAB — LIPID PANEL
Cholesterol: 167 mg/dL (ref 0–200)
HDL: 32.3 mg/dL — ABNORMAL LOW (ref >39.00)
Total CHOL/HDL Ratio: 5
Triglycerides: 475 mg/dL — ABNORMAL HIGH (ref 0.0–149.0)

## 2020-10-04 LAB — FERRITIN: Ferritin: 105.9 ng/mL (ref 10.0–291.0)

## 2020-10-04 LAB — TSH: TSH: 2.68 u[IU]/mL (ref 0.35–5.50)

## 2020-10-04 LAB — VITAMIN D 25 HYDROXY (VIT D DEFICIENCY, FRACTURES): VITD: 15.88 ng/mL — ABNORMAL LOW (ref 30.00–100.00)

## 2020-10-04 LAB — T4, FREE: Free T4: 0.54 ng/dL — ABNORMAL LOW (ref 0.60–1.60)

## 2020-10-04 LAB — HEMOGLOBIN A1C: Hgb A1c MFr Bld: 5.6 % (ref 4.6–6.5)

## 2020-10-04 LAB — T3, FREE: T3, Free: 3.2 pg/mL (ref 2.3–4.2)

## 2020-10-10 MED ORDER — VITAMIN D (ERGOCALCIFEROL) 1.25 MG (50000 UNIT) PO CAPS: 50000.0000 [IU] | ORAL_CAPSULE | ORAL | 0 refills | Status: DC

## 2020-10-10 NOTE — Addendum Note (Signed)
Addended by: Johnella Moloney on: 10/10/2020 08:40 AM   Modules accepted: Orders

## 2020-10-22 ENCOUNTER — Encounter: Payer: Self-pay | Admitting: Family Medicine

## 2020-10-26 ENCOUNTER — Other Ambulatory Visit: Payer: Self-pay | Admitting: Family Medicine

## 2020-11-02 ENCOUNTER — Ambulatory Visit: Payer: BC Managed Care – PPO | Admitting: Cardiology

## 2020-11-07 ENCOUNTER — Ambulatory Visit (INDEPENDENT_AMBULATORY_CARE_PROVIDER_SITE_OTHER): Payer: BC Managed Care – PPO | Admitting: Family Medicine

## 2020-11-07 ENCOUNTER — Encounter: Payer: Self-pay | Admitting: Family Medicine

## 2020-11-07 ENCOUNTER — Other Ambulatory Visit: Payer: Self-pay

## 2020-11-07 VITALS — BP 120/80 | HR 96 | Temp 98.0°F | Ht 66.0 in | Wt 262.1 lb

## 2020-11-07 DIAGNOSIS — M436 Torticollis: Secondary | ICD-10-CM | POA: Diagnosis not present

## 2020-11-07 DIAGNOSIS — G5601 Carpal tunnel syndrome, right upper limb: Secondary | ICD-10-CM | POA: Diagnosis not present

## 2020-11-07 DIAGNOSIS — E119 Type 2 diabetes mellitus without complications: Secondary | ICD-10-CM

## 2020-11-07 DIAGNOSIS — I10 Essential (primary) hypertension: Secondary | ICD-10-CM

## 2020-11-07 MED ORDER — BACLOFEN 10 MG PO TABS
10.0000 mg | ORAL_TABLET | Freq: Two times a day (BID) | ORAL | 2 refills | Status: DC
Start: 2020-11-07 — End: 2021-06-19

## 2020-11-07 MED ORDER — TRIAMCINOLONE ACETONIDE 40 MG/ML IJ SUSP
40.0000 mg | Freq: Once | INTRAMUSCULAR | Status: AC
  Administered 2020-11-07: 40 mg via INTRA_ARTICULAR

## 2020-11-07 MED ORDER — METFORMIN HCL ER 750 MG PO TB24: 750.0000 mg | ORAL_TABLET | Freq: Every day | ORAL | 1 refills | Status: DC
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
## 2020-11-07 NOTE — Patient Instructions (Addendum)
You will hear about hand surgery referral in the next couple of weeks.   *set up follow up with cardiology. Suggest monitoring blood pressure at home since running on lower end; may be able to back off of medication.   *would limit baclofen to once daily or less with breast feeding. Can increase to twice daily dosing once done breast feeding.

## 2020-11-09 ENCOUNTER — Encounter: Payer: Self-pay | Admitting: Family Medicine

## 2020-11-12 MED ORDER — NAPROXEN 500 MG PO TABS: 500.0000 mg | ORAL_TABLET | Freq: Two times a day (BID) | ORAL | 2 refills | Status: DC | PRN

## 2020-11-15 ENCOUNTER — Telehealth: Payer: Self-pay

## 2020-11-15 NOTE — Telephone Encounter (Signed)
Spoke with the patient and informed her refills were previously sent to [facility] on 10/31.

## 2020-11-15 NOTE — Telephone Encounter (Signed)
Patient called to check on the status of Rx refill naproxen (NAPROSYN) 500 MG tablet

## 2020-11-19 ENCOUNTER — Encounter: Payer: Self-pay | Admitting: Family Medicine

## 2020-11-20 NOTE — Telephone Encounter (Signed)
Spoke with the patient and informed her I can take a detailed message for her and send this to Dr Hassan Rowan for review.  Patient declined and stated she will "just wait as most people will not understand".  Message sent to PCP.

## 2020-11-27 ENCOUNTER — Other Ambulatory Visit: Payer: Self-pay | Admitting: Family Medicine

## 2020-11-27 MED ORDER — CEPHALEXIN 500 MG PO CAPS: 500.0000 mg | ORAL_CAPSULE | Freq: Two times a day (BID) | ORAL | 0 refills | Status: AC

## 2020-12-19 ENCOUNTER — Ambulatory Visit (INDEPENDENT_AMBULATORY_CARE_PROVIDER_SITE_OTHER): Payer: BC Managed Care – PPO | Admitting: Family Medicine

## 2020-12-19 ENCOUNTER — Encounter: Payer: Self-pay | Admitting: Family Medicine

## 2020-12-19 VITALS — BP 132/80 | HR 77 | Temp 97.6°F | Ht 66.0 in | Wt 272.2 lb

## 2020-12-19 DIAGNOSIS — E781 Pure hyperglyceridemia: Secondary | ICD-10-CM | POA: Diagnosis not present

## 2020-12-19 DIAGNOSIS — F32A Depression, unspecified: Secondary | ICD-10-CM

## 2020-12-19 DIAGNOSIS — Z6841 Body Mass Index (BMI) 40.0 and over, adult: Secondary | ICD-10-CM

## 2020-12-19 DIAGNOSIS — I10 Essential (primary) hypertension: Secondary | ICD-10-CM | POA: Diagnosis not present

## 2020-12-19 MED ORDER — PHENTERMINE HCL 37.5 MG PO CAPS: 37.5000 mg | ORAL_CAPSULE | ORAL | 0 refills | Status: DC

## 2020-12-19 NOTE — Progress Notes (Signed)
Katie [name] DOB: [birth year] Encounter date: 12/19/2020  This is a 35 y.o. female who presents with Chief Complaint  Patient presents with   Follow-up    History of present illness: Last visit with me was 10/26. We completed carpal tunnel injection at that time. Had nerve testing completed and follow up is scheduled. Needs to set up follow up. Told she has muscle injury left arm. Has a lot of pain in that arm; even dropping things.   Having trouble controlling what she is eating. Still hungry after eating. Hungry about 15 minutes after eating. Knows not always making good selections. Sometimes grabbing quick things at work. She was on phentermine in past, but doesn't remember being this hungry in the past. She is no longer breast feeding, pumping and hunger hasn't changed. Trying to eat low carb foods.   DMII: metformin 754m daily xr HTN: follows with cardiology.   Triglycerides were very elevated with last bloodwork 9/21 (475): she was not fasting for that bloodwork.    Allergies  Allergen Reactions   Flexeril [Cyclobenzaprine Hcl] Shortness Of Breath   Lorazepam     Other reaction(s): Other Neck spasm   Current Meds  Medication Sig   acetaminophen (TYLENOL) 500 MG tablet Take 2 tablets (1,000 mg total) by mouth every 6 (six) hours.   albuterol (VENTOLIN HFA) 108 (90 Base) MCG/ACT inhaler Inhale 2 puffs into the lungs every 6 (six) hours as needed for wheezing or shortness of breath.   baclofen (LIORESAL) 10 MG tablet Take 1 tablet (10 mg total) by mouth 2 (two) times daily.   blood glucose meter kit and supplies KIT Use as directed twice a day   Blood Glucose Monitoring Suppl (CONTOUR NEXT ONE) KIT Use as directed   busPIRone (BUSPAR) 7.5 MG tablet Take 7.5 mg by mouth 3 (three) times daily.   butalbital-acetaminophen-caffeine (FIORICET) 50-325-40 MG tablet Take 1-2 tablets by mouth every 6 (six) hours as needed for headache.   cetirizine (ZYRTEC) 10 MG tablet Take  10 mg by mouth daily.   famotidine (PEPCID) 20 MG tablet TAKE ONE TABLET BY MOUTH EVERY MORNING AND TAKE ONE TABLET BY MOUTH EVERY NIGHT AT BEDTIME   Ferrous Sulfate (IRON PO) Take by mouth daily.   furosemide (LASIX) 40 MG tablet Lasix 40 mg once daily for 7 days then every other day. (Patient taking differently: Take 40 mg by mouth every other day.)   glucose blood (CONTOUR NEXT TEST) test strip Use as instructed   labetalol (NORMODYNE) 200 MG tablet Take 1 tablet (200 mg total) by mouth 3 (three) times daily.   metFORMIN (GLUCOPHAGE XR) 750 MG 24 hr tablet Take 1 tablet (750 mg total) by mouth daily with breakfast.   metoCLOPramide (REGLAN) 10 MG tablet Take 1 tablet (10 mg total) by mouth 3 (three) times daily before meals.   naproxen (NAPROSYN) 500 MG tablet Take 1 tablet (500 mg total) by mouth 2 (two) times daily as needed for moderate pain.   omeprazole (PRILOSEC) 40 MG capsule TAKE ONE CAPSULE BY MOUTH DAILY   sertraline (ZOLOFT) 100 MG tablet Take 1 tablet by mouth daily.   Vitamin D, Ergocalciferol, (DRISDOL) 1.25 MG (50000 UNIT) CAPS capsule Take 1 capsule (50,000 Units total) by mouth every 7 (seven) days.    Review of Systems  Constitutional:  Negative for chills, fatigue and fever.  Respiratory:  Negative for cough, chest tightness, shortness of breath and wheezing.   Cardiovascular:  Negative for chest pain, palpitations and  leg swelling.   Objective:  BP 132/80 (BP Location: Left Arm, Patient Position: Sitting, Cuff Size: Large)   Pulse 77   Temp 97.6 F (36.4 C) (Oral)   Ht '5\' 6"'  (1.676 m)   Wt 272 lb 3.2 oz (123.5 kg)   SpO2 97%   BMI 43.93 kg/m   Weight: 272 lb 3.2 oz (123.5 kg)   BP Readings from Last 3 Encounters:  12/19/20 132/80  11/07/20 120/80  10/03/20 124/80   Wt Readings from Last 3 Encounters:  12/19/20 272 lb 3.2 oz (123.5 kg)  11/07/20 262 lb 1.6 oz (118.9 kg)  10/03/20 255 lb 6.4 oz (115.8 kg)    Physical Exam Constitutional:      General:  She is not in acute distress.    Appearance: She is well-developed.  Cardiovascular:     Rate and Rhythm: Normal rate and regular rhythm.     Heart sounds: Normal heart sounds. No murmur heard.   No friction rub.  Pulmonary:     Effort: Pulmonary effort is normal. No respiratory distress.     Breath sounds: Normal breath sounds. No wheezing or rales.  Musculoskeletal:     Right lower leg: No edema.     Left lower leg: No edema.  Neurological:     Mental Status: She is alert and oriented to person, place, and time.  Psychiatric:        Behavior: Behavior normal.    Assessment/Plan  1. Hypertriglyceridemia We discussed working on healthier eating, portion control.  See below.  2. Hypertension, unspecified type Blood pressure has been good.  Continue follow-up with cardiology.  Currently on labetalol 200 mg 3 times daily, hydrochlorothiazide 25 mg daily.  3. Depression, unspecified depression type  Mood has been stable on Zoloft 100 mg, BuSpar 7.5 mg 3 times daily.  4. Class 3 severe obesity due to excess calories with serious comorbidity and body mass index (BMI) of 40.0 to 44.9 in adult [facility]) We discussed medication options for weight loss.  She has tried phentermine in the past, had some success with this.  We will retry this today.  We will have her closely follow-up to make sure she is losing weight on this medication. Discussed new medication(s) today with patient. Discussed potential side effects and patient verbalized understanding.     Return in about 1 month (around 01/19/2021) for Chronic condition visit.    Micheline Rough, MD

## 2020-12-30 ENCOUNTER — Other Ambulatory Visit: Payer: Self-pay | Admitting: Family Medicine

## 2020-12-31 ENCOUNTER — Telehealth: Payer: Self-pay | Admitting: Family Medicine

## 2020-12-31 ENCOUNTER — Other Ambulatory Visit: Payer: Self-pay | Admitting: Family Medicine

## 2020-12-31 DIAGNOSIS — Z6841 Body Mass Index (BMI) 40.0 and over, adult: Secondary | ICD-10-CM

## 2020-12-31 MED ORDER — PHENTERMINE HCL 37.5 MG PO CAPS: 37.5000 mg | ORAL_CAPSULE | ORAL | 0 refills | Status: DC

## 2020-12-31 NOTE — Telephone Encounter (Signed)
I have resent this

## 2020-12-31 NOTE — Telephone Encounter (Signed)
Pt seen dr Hassan Rowan on 12-19-2020. Pt is calling and [facility] does not have phentermine 37.5 MG capsule in stock  and pt has check and needs rx to go to [id number] - [place], [place] - [address] AT [facility] [address] Phone:  [phone]  Fax:  [phone]

## 2021-01-15 ENCOUNTER — Encounter: Payer: Self-pay | Admitting: Family Medicine

## 2021-01-22 ENCOUNTER — Telehealth: Payer: BC Managed Care – PPO | Admitting: Family Medicine

## 2021-01-22 DIAGNOSIS — R051 Acute cough: Secondary | ICD-10-CM

## 2021-01-22 DIAGNOSIS — J019 Acute sinusitis, unspecified: Secondary | ICD-10-CM

## 2021-01-22 DIAGNOSIS — B9689 Other specified bacterial agents as the cause of diseases classified elsewhere: Secondary | ICD-10-CM | POA: Diagnosis not present

## 2021-01-22 MED ORDER — BENZONATATE 100 MG PO CAPS
100.0000 mg | ORAL_CAPSULE | Freq: Two times a day (BID) | ORAL | 0 refills | Status: DC | PRN
Start: 2021-01-22 — End: 2021-02-20

## 2021-01-22 MED ORDER — DOXYCYCLINE HYCLATE 100 MG PO TABS: 100.0000 mg | ORAL_TABLET | Freq: Two times a day (BID) | ORAL | 0 refills | Status: AC

## 2021-01-22 NOTE — Progress Notes (Signed)
E-Visit for Sinus Problems  We are sorry that you are not feeling well.  Here is how we plan to help!  Based on what you have shared with me it looks like you have sinusitis.  Sinusitis is inflammation and infection in the sinus cavities of the head.  Based on your presentation I believe you most likely have Acute Bacterial Sinusitis.  This is an infection caused by bacteria and is treated with antibiotics. I have prescribed Doxycycline 100mg  by mouth twice a day for 10 days. You may use an oral decongestant such as Mucinex D or if you have glaucoma or high blood pressure use plain Mucinex. Saline nasal spray help and can safely be used as often as needed for congestion.  If you develop worsening sinus pain, fever or notice severe headache and vision changes, or if symptoms are not better after completion of antibiotic, please schedule an appointment with a health care provider.    I have ordered Tessalon perles for you as well  Sinus infections are not as easily transmitted as other respiratory infection, however we still recommend that you avoid close contact with loved ones, especially the very young and elderly.  Remember to wash your hands thoroughly throughout the day as this is the number one way to prevent the spread of infection!  Home Care: Only take medications as instructed by your medical team. Complete the entire course of an antibiotic. Do not take these medications with alcohol. A steam or ultrasonic humidifier can help congestion.  You can place a towel over your head and breathe in the steam from hot water coming from a faucet. Avoid close contacts especially the very young and the elderly. Cover your mouth when you cough or sneeze. Always remember to wash your hands.  Get Help Right Away If: You develop worsening fever or sinus pain. You develop a severe head ache or visual changes. Your symptoms persist after you have completed your treatment plan.  Make sure  you Understand these instructions. Will watch your condition. Will get help right away if you are not doing well or get worse.  Thank you for choosing an e-visit.  Your e-visit answers were reviewed by a board certified advanced clinical practitioner to complete your personal care plan. Depending upon the condition, your plan could have included both over the counter or prescription medications.  Please review your pharmacy choice. Make sure the pharmacy is open so you can pick up prescription now. If there is a problem, you may contact your provider through and have the prescription routed to another pharmacy.  Your safety is important to [employer]. If you have drug allergies check your prescription carefully.   For the next 24 hours you can use MyChart to ask questions about today's visit, request a non-urgent call back, or ask for a work or school excuse. You will get an email in the next two days asking about your experience. I hope that your e-visit has been valuable and will speed your recovery.  I provided 5 minutes of non face-to-face time during this encounter for chart review, medication and order placement, as well as and documentation.

## 2021-01-25 ENCOUNTER — Ambulatory Visit: Payer: BC Managed Care – PPO | Admitting: Family Medicine

## 2021-01-28 ENCOUNTER — Other Ambulatory Visit: Payer: Self-pay | Admitting: Family Medicine

## 2021-02-08 ENCOUNTER — Encounter: Payer: Self-pay | Admitting: Family Medicine

## 2021-02-12 ENCOUNTER — Other Ambulatory Visit: Payer: Self-pay | Admitting: Family Medicine

## 2021-02-13 ENCOUNTER — Other Ambulatory Visit: Payer: Self-pay | Admitting: Family Medicine

## 2021-02-13 DIAGNOSIS — Z6841 Body Mass Index (BMI) 40.0 and over, adult: Secondary | ICD-10-CM

## 2021-02-13 MED ORDER — PHENTERMINE HCL 37.5 MG PO CAPS: 37.5000 mg | ORAL_CAPSULE | ORAL | 0 refills | Status: DC

## 2021-02-15 ENCOUNTER — Ambulatory Visit: Payer: BC Managed Care – PPO | Admitting: Family Medicine

## 2021-02-20 ENCOUNTER — Encounter: Payer: Self-pay | Admitting: Family Medicine

## 2021-02-20 ENCOUNTER — Telehealth (INDEPENDENT_AMBULATORY_CARE_PROVIDER_SITE_OTHER): Payer: BC Managed Care – PPO | Admitting: Family Medicine

## 2021-02-20 DIAGNOSIS — Z6841 Body Mass Index (BMI) 40.0 and over, adult: Secondary | ICD-10-CM

## 2021-02-20 DIAGNOSIS — I10 Essential (primary) hypertension: Secondary | ICD-10-CM

## 2021-02-20 DIAGNOSIS — R7301 Impaired fasting glucose: Secondary | ICD-10-CM

## 2021-02-20 DIAGNOSIS — E66813 Obesity, class 3: Secondary | ICD-10-CM

## 2021-02-20 DIAGNOSIS — K760 Fatty (change of) liver, not elsewhere classified: Secondary | ICD-10-CM

## 2021-02-20 DIAGNOSIS — J4520 Mild intermittent asthma, uncomplicated: Secondary | ICD-10-CM | POA: Diagnosis not present

## 2021-02-20 MED ORDER — SEMAGLUTIDE-WEIGHT MANAGEMENT 1 MG/0.5ML ~~LOC~~ SOAJ: 1.0000 mg | SUBCUTANEOUS | 0 refills | Status: DC

## 2021-02-20 MED ORDER — SEMAGLUTIDE-WEIGHT MANAGEMENT 0.25 MG/0.5ML ~~LOC~~ SOAJ: 0.2500 mg | SUBCUTANEOUS | 0 refills | Status: DC

## 2021-02-20 MED ORDER — SEMAGLUTIDE-WEIGHT MANAGEMENT 0.5 MG/0.5ML ~~LOC~~ SOAJ: 0.5000 mg | SUBCUTANEOUS | 0 refills | Status: DC

## 2021-02-20 MED ORDER — SEMAGLUTIDE-WEIGHT MANAGEMENT 1.7 MG/0.75ML ~~LOC~~ SOAJ: 1.7000 mg | SUBCUTANEOUS | 0 refills | Status: DC

## 2021-02-20 MED ORDER — SEMAGLUTIDE-WEIGHT MANAGEMENT 2.4 MG/0.75ML ~~LOC~~ SOAJ
2.4000 mg | SUBCUTANEOUS | 2 refills | Status: DC
Start: 2021-06-16 — End: 2021-03-08

## 2021-02-20 NOTE — Progress Notes (Signed)
Virtual Visit via Video Note  I connected with Katie [name]  on 02/20/21 at  1:30 PM EST by a video enabled telemedicine application and verified that I am speaking with the correct person using two identifiers.  Location patient: home Location provider: [place], [place] [address] Persons participating in the virtual visit: patient, provider  I discussed the limitations of evaluation and management by telemedicine and the availability of in person appointments. The patient expressed understanding and agreed to proceed.   Katie [name] DOB: March 21, 1985 Encounter date: 02/20/2021  This is a 36 y.o. female who presents with Chief Complaint  Patient presents with   Follow-up    History of present illness:  She has more stressors. 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 Trying to buy a house. Feels like she has been stress eating. Working 77 hours just for her first job this week. Doing door dash for second job.   Eating eggs, low carb yogurt, fruit for breakfast. Lunch has frozen meal, low carb. In between there she is sitting all day. Not sure if she just wants to eat or feels like she needs to eat. Eating before she is hungry. Not always healthy. Snacks at work are more junk. Does feel that adipex helps, but with stressors feels like it is hard to focus on eating healthy. Also eating maybe to help with fatigue.   Mom/dad are watching her infant son. Mom has been limited due to ortho condition s/p surgery.    She is on the zoloft,buspar for mood. Seems to be stable with this.   Increased neck pain, spasm. Blazer for work increases this.    Allergies  Allergen Reactions   Flexeril [Cyclobenzaprine Hcl] Shortness Of Breath   Lorazepam     Other reaction(s): Other Neck spasm   Current Meds  Medication Sig   acetaminophen (TYLENOL) 500 MG tablet Take 2 tablets (1,000 mg total) by  mouth every 6 (six) hours. (Patient taking differently: Take 1,000 mg by mouth as needed.)   albuterol (VENTOLIN HFA) 108 (90 Base) MCG/ACT inhaler Inhale 2 puffs into the lungs every 6 (six) hours as needed for wheezing or shortness of breath.   baclofen (LIORESAL) 10 MG tablet Take 1 tablet (10 mg total) by mouth 2 (two) times daily.   blood glucose meter kit and supplies KIT Use as directed twice a day   Blood Glucose Monitoring Suppl (CONTOUR NEXT ONE) KIT Use as directed   busPIRone (BUSPAR) 7.5 MG tablet Take 7.5 mg by mouth 3 (three) times daily.   butalbital-acetaminophen-caffeine (FIORICET) 50-325-40 MG tablet Take 1-2 tablets by mouth every 6 (six) hours as needed for headache.   cetirizine (ZYRTEC) 10 MG tablet Take 10 mg by mouth daily.   famotidine (PEPCID) 20 MG tablet TAKE ONE TABLET BY MOUTH EVERY MORNING AND TAKE ONE TABLET BY MOUTH EVERY NIGHT AT BEDTIME   Ferrous Sulfate (IRON PO) Take by mouth daily.   furosemide (LASIX) 40 MG tablet Lasix 40 mg once daily for 7 days then every other day. (Patient taking differently: Take 40 mg by mouth every other day.)   glucose blood (CONTOUR NEXT TEST) test strip Use as instructed   labetalol (NORMODYNE) 200 MG tablet Take 1 tablet (200 mg total) by mouth 3 (three) times daily.   metFORMIN (GLUCOPHAGE XR) 750 MG 24 hr tablet Take 1 tablet (750 mg total) by  mouth daily with breakfast.   metoCLOPramide (REGLAN) 10 MG tablet Take 1 tablet (10 mg total) by mouth 3 (three) times daily before meals.   naproxen (NAPROSYN) 500 MG tablet TAKE ONE TABLET BY MOUTH TWICE A DAY AS NEEDED FOR MODERATE PAIN   omeprazole (PRILOSEC) 40 MG capsule TAKE ONE CAPSULE BY MOUTH DAILY   phentermine 37.5 MG capsule Take 1 capsule (37.5 mg total) by mouth every morning.   sertraline (ZOLOFT) 100 MG tablet Take 1 tablet by mouth daily.   Vitamin D, Ergocalciferol, (DRISDOL) 1.25 MG (50000 UNIT) CAPS capsule TAKE ONE CAPSULE BY MOUTH ONCE WEEKLY    Review of  Systems  Constitutional:  Positive for unexpected weight change (feels she is gaining, but doesn't have scale). Negative for chills, fatigue and fever.  Respiratory:  Negative for cough, chest tightness, shortness of breath and wheezing.   Cardiovascular:  Negative for chest pain, palpitations and leg swelling.  Musculoskeletal:  Positive for neck pain and neck stiffness.   Objective:  LMP 02/17/2021 (Exact Date)       BP Readings from Last 3 Encounters:  12/19/20 132/80  11/07/20 120/80  10/03/20 124/80   Wt Readings from Last 3 Encounters:  12/19/20 272 lb 3.2 oz (123.5 kg)  11/07/20 262 lb 1.6 oz (118.9 kg)  10/03/20 255 lb 6.4 oz (115.8 kg)    EXAM:  GENERAL: alert, oriented, appears well and in no acute distress  HEENT: atraumatic, conjunctiva clear, no obvious abnormalities on inspection of external nose and ears  NECK: limited neck mobility with spasm and left lateral position.  LUNGS: on inspection no signs of respiratory distress, breathing rate appears normal, no obvious gross SOB, gasping or wheezing  CV: no obvious cyanosis  MS: moves all visible extremities without noticeable abnormality  PSYCH/NEURO: pleasant and cooperative. She is frustrated with current stressors with mom, excess work hours.  Assessment/Plan 1. Class 3 severe obesity due to excess calories with serious comorbidity and body mass index (BMI) of 40.0 to 44.9 in adult [facility]) Phentermine doesn't seem to be helping her like it did in the past. Discussed options,and I do feel that wegovy will be good choice for her given med history, difficulty with weight loss. Discussed new medication(s) today with patient. Discussed potential side effects and patient verbalized understanding.  - Semaglutide-Weight Management 0.25 MG/0.5ML SOAJ; Inject 0.25 mg into the skin once a week for 28 days.  Dispense: 2 mL; Refill: 0 - Semaglutide-Weight Management 0.5 MG/0.5ML SOAJ; Inject 0.5 mg into the skin once a week  for 28 days.  Dispense: 2 mL; Refill: 0 - Semaglutide-Weight Management 1 MG/0.5ML SOAJ; Inject 1 mg into the skin once a week for 28 days.  Dispense: 2 mL; Refill: 0 - Semaglutide-Weight Management 1.7 MG/0.75ML SOAJ; Inject 1.7 mg into the skin once a week for 28 days.  Dispense: 3 mL; Refill: 0 - Semaglutide-Weight Management 2.4 MG/0.75ML SOAJ; Inject 2.4 mg into the skin once a week for 28 days.  Dispense: 3 mL; Refill: 2  2. Hypertension, unspecified type She hasn't been checking bp at home, but is compliant with meds. Next visit should be in person for weight check in.   3. Mild intermittent asthma without complication Breathing has been stable.  4. Hepatic steatosis We will continue to work on weight loss and healthier eating. - Semaglutide-Weight Management 0.25 MG/0.5ML SOAJ; Inject 0.25 mg into the skin once a week for 28 days.  Dispense: 2 mL; Refill: 0    She would  like to see psychiatry, but wants to have a "clean [place]" in the next week or.  She worries about ability of new psychiatrist to see prior records.  We discussed this in detail today and I think that it is reasonable for her to approach situation with being more upfront any psychiatrist is going to limit controlled substances and would check history of prescription of these.  I think it is good for whoever is seeing her to know her full history and for her to talk through this with the, even if she is at a different place right now.  I discussed the assessment and treatment plan with the patient. The patient was provided an opportunity to ask questions and all were answered. The patient agreed with the plan and demonstrated an understanding of the instructions.   The patient was advised to call back or seek an in-person evaluation if the symptoms worsen or if the condition fails to improve as anticipated.  I provided 35 minutes of face-to-face time during this encounter.   Micheline Rough, MD

## 2021-02-22 ENCOUNTER — Ambulatory Visit: Payer: BC Managed Care – PPO | Admitting: Cardiology

## 2021-02-22 ENCOUNTER — Other Ambulatory Visit: Payer: Self-pay | Admitting: Family Medicine

## 2021-02-26 ENCOUNTER — Encounter: Payer: Self-pay | Admitting: Family Medicine

## 2021-03-06 ENCOUNTER — Encounter: Payer: Self-pay | Admitting: Family Medicine

## 2021-03-06 NOTE — Telephone Encounter (Signed)
Noted  

## 2021-03-08 ENCOUNTER — Encounter: Payer: Self-pay | Admitting: Cardiology

## 2021-03-08 ENCOUNTER — Ambulatory Visit: Payer: BC Managed Care – PPO | Admitting: Family Medicine

## 2021-03-08 ENCOUNTER — Ambulatory Visit (INDEPENDENT_AMBULATORY_CARE_PROVIDER_SITE_OTHER): Payer: BC Managed Care – PPO | Admitting: Cardiology

## 2021-03-08 ENCOUNTER — Other Ambulatory Visit: Payer: Self-pay

## 2021-03-08 VITALS — BP 134/90 | HR 96 | Ht 66.0 in | Wt 265.4 lb

## 2021-03-08 DIAGNOSIS — F419 Anxiety disorder, unspecified: Secondary | ICD-10-CM

## 2021-03-08 DIAGNOSIS — E119 Type 2 diabetes mellitus without complications: Secondary | ICD-10-CM | POA: Diagnosis not present

## 2021-03-08 DIAGNOSIS — F3289 Other specified depressive episodes: Secondary | ICD-10-CM

## 2021-03-08 DIAGNOSIS — R079 Chest pain, unspecified: Secondary | ICD-10-CM

## 2021-03-08 MED ORDER — VALSARTAN 80 MG PO TABS: 80.0000 mg | ORAL_TABLET | Freq: Every day | ORAL | 3 refills | Status: DC

## 2021-03-08 MED ORDER — PROPRANOLOL HCL 10 MG PO TABS: 10.0000 mg | ORAL_TABLET | Freq: Every day | ORAL | 3 refills | Status: DC

## 2021-03-08 MED ORDER — METOPROLOL TARTRATE 100 MG PO TABS: ORAL_TABLET | ORAL | 0 refills | Status: DC

## 2021-03-08 NOTE — Patient Instructions (Signed)
Medication Instructions:  Your physician has recommended you make the following change in your medication:  STOP: Labetalol START: Propranolol 10 mg daily START: Valsartan 80 mg once daily Labetalol: Take 2 times daily for the next two days, then once day for two days, then stop. Start the propranolol after stopping Labetalol.     *If you need a refill on your cardiac medications before your next appointment, please call your pharmacy*   Lab Work: Your physician recommends that you return for lab work in:  TODAY: BMET, Mag If you have labs (blood work) drawn today and your tests are completely normal, you will receive your results only by: MyChart Message (if you have MyChart) OR A paper copy in the mail If you have any lab test that is abnormal or we need to change your treatment, we will call you to review the results.   Testing/Procedures:   Your cardiac CT will be scheduled at one of the below locations:   [facility] [address] [place], [place] [address] [phone]   If scheduled at [facility], please arrive at the [facility] and Children's Entrance (Entrance C2) of [facility] 30 minutes prior to test start time. You can use the FREE valet parking offered at entrance C (encouraged to control the heart rate for the test)  Proceed to the [facility] Radiology Department (first floor) to check-in and test prep.  All radiology patients and guests should use entrance C2 at [facility], accessed from [facility], even though the hospital's physical address listed is [address]     Please follow these instructions carefully (unless otherwise directed):   On the Night Before the Test: Be sure to Drink plenty of water. Do not consume any caffeinated/decaffeinated beverages or chocolate 12 hours prior to your test. Do not take any antihistamines 12 hours prior to your test.  On the Day of the Test: Drink  plenty of water until 1 hour prior to the test. Do not eat any food 4 hours prior to the test. You may take your regular medications prior to the test.  Take metoprolol (Lopressor) two hours prior to test. HOLD Propranolol the day of test HOLD Furosemide/Hydrochlorothiazide morning of the test. FEMALES- please wear underwire-free bra if available, avoid dresses & tight clothing       After the Test: Drink plenty of water. After receiving IV contrast, you may experience a mild flushed feeling. This is normal. On occasion, you may experience a mild rash up to 24 hours after the test. This is not dangerous. If this occurs, you can take Benadryl 25 mg and increase your fluid intake. If you experience trouble breathing, this can be serious. If it is severe call 911 IMMEDIATELY. If it is mild, please call our office. If you take any of these medications: Glipizide/Metformin, Avandament, Glucavance, please do not take 48 hours after completing test unless otherwise instructed.  We will call to schedule your test 2-4 weeks out understanding that some insurance companies will need an authorization prior to the service being performed.   For non-scheduling related questions, please contact the cardiac imaging nurse navigator should you have any questions/concerns: Rockwell Alexandria, Cardiac Imaging Nurse Navigator Larey Brick, Cardiac Imaging Nurse Navigator [facility] Direct Office Dial: [phone]   For scheduling needs, including cancellations and rescheduling, please call [place], [phone].      Follow-Up: At [facility], you and your health needs  are our priority.  As part of our continuing mission to provide you with exceptional heart care, we have created designated Provider Care Teams.  These Care Teams include your primary Cardiologist (physician) and Advanced Practice Providers (APPs -  Physician Assistants and Nurse Practitioners) who all work  together to provide you with the care you need, when you need it.  We recommend signing up for the patient portal called "MyChart".  Sign up information is provided on this After Visit Summary.  MyChart is used to connect with patients for Virtual Visits (Telemedicine).  Patients are able to view lab/test results, encounter notes, upcoming appointments, etc.  Non-urgent messages can be sent to your provider as well.   To learn more about what you can do with MyChart, go to [email or web address].    Your next appointment:   8 week(s)  The format for your next appointment:   Virtual Visit   Provider:   Thomasene Ripple, DO     Other Instructions

## 2021-03-09 NOTE — Progress Notes (Signed)
Cardio-Obstetrics Clinic  Follow Up Note   Date:  03/09/2021   ID:  Katie [name], [place] [birth year], MRN [id number]  PCP:  Caren Macadam, MD   [facility] Providers Cardiologist:  Katie Salines, DO  Electrophysiologist:  None        Referring MD: Caren Macadam, MD   Chief Complaint: " I am having chest pains"  History of Present Illness:    Katie [name] is a 36 y.o. female [G2P2002] who returns for follow up.  Her recent pregnancy complicated by chronic hypertension in pregnancy, paroxysmal supraventricular tachycardia which was suspected to be AVNRT versus atrial tachycardia,Type 2 diabetes mellitus, anxiety, obesity.   We initially saw the patient on April 02, 2020, in our cardio obstetrics clinic due to her cardiovascular risk factors. During her initial visit we ordered an echocardiogram to assess her LV and RV function and for any structural abnormalities.  We also placed a ZIO monitor on the patient- and these results were discussed with her.   She did undergo cesarean section and giving birth to her baby.  She was discharged to home on Jun 02, 2020.  Since her discharge she tells me that she has had significant leg edema. She denies chest pain and shortness of breath.  I saw the patient Jun 05, 2020 at that time she appeared to be volume overloaded.  I started her on Lasix to help with diuresis.   I saw the patient on 06/15/2020 at that time, I kept her on the labetalol and added HCTZ 12.5 mg daily. We referred the patient to behavioral health.   I saw the patient on July 20, 2020 at that time  Prior CV Studies Reviewed: The following studies were reviewed today:  TTE 04/09/2020 IMPRESSIONS   1. Left ventricular ejection fraction, by estimation, is 60 to 65%. The left ventricle has normal function. Left ventricular endocardial border not optimally defined to evaluate regional wall motion. There is mild concentric left ventricular   hypertrophy. Left ventricular diastolic parameters are consistent with Grade I diastolic dysfunction (impaired relaxation).   2. Right ventricular systolic function is normal. The right ventricular  size is normal. There is normal pulmonary artery systolic pressure.   3. The mitral valve is normal in structure. No evidence of mitral valve  regurgitation. No evidence of mitral stenosis.   4. The aortic valve is tricuspid. Aortic valve regurgitation is not  visualized. No aortic stenosis is present.   5. The inferior vena cava is normal in size with greater than 50%  respiratory variability, suggesting right atrial pressure of 3 mmHg.   FINDINGS   Left Ventricle: Left ventricular ejection fraction, by estimation, is 60  to 65%. The left ventricle has normal function. Left ventricular  endocardial border not optimally defined to evaluate regional wall motion.  The left ventricular internal cavity  size was normal in size. There is mild concentric left ventricular  hypertrophy. Left ventricular diastolic parameters are consistent with  Grade I diastolic dysfunction (impaired relaxation). Normal left  ventricular filling pressure.   Right Ventricle: The right ventricular size is normal. No increase in right ventricular wall thickness. Right ventricular systolic function is  normal. There is normal pulmonary artery systolic pressure. The tricuspid  regurgitant velocity is 2.42 m/s, and   with an assumed right atrial pressure of 3 mmHg, the estimated right  ventricular systolic pressure is 99991111 mmHg.   Left Atrium: Left atrial size was normal in size.   Right Atrium:  Right atrial size was normal in size.   Pericardium: There is no evidence of pericardial effusion.   Mitral Valve: The mitral valve is normal in structure. No evidence of mitral valve regurgitation. No evidence of mitral valve stenosis.   Tricuspid Valve: The tricuspid valve is normal in structure. Tricuspid valve  regurgitation is trivial. No evidence of tricuspid stenosis.   Aortic Valve: The aortic valve is tricuspid. Aortic valve regurgitation is not visualized. No aortic stenosis is present. Aortic valve mean gradient measures 6.0 mmHg. Aortic valve peak gradient measures 11.3 mmHg. Aortic  valve area, by VTI measures 2.72  cm.   Pulmonic Valve: The pulmonic valve was normal in structure. Pulmonic valve regurgitation is not visualized. No evidence of pulmonic stenosis.   Aorta: The aortic root and ascending aorta are structurally normal, with  no evidence of dilitation and the aortic arch was not well visualized.   Venous: The pulmonary veins were not well visualized. The inferior vena cava is normal in size with greater than 50% respiratory variability, suggesting right atrial pressure of 3 mmHg.   IAS/Shunts: No atrial level shunt detected by color flow Doppler.   Zio monitor The patient wore the monitor for 77 hours starting April 02, 2020. Indication: Palpitations   The minimum heart rate was 64 bpm, maximum heart rate was 149 bpm, and average heart rate was 99 bpm. Predominant underlying rhythm was Sinus Rhythm.     Premature atrial complexes were rare less than 1%. Premature Ventricular complexes were rare less than 1%.   No ventricular tachycardia, no significant pauses, No AV block, no supraventricular tachycardia and no atrial fibrillation present.   6 patient triggered events noted are associated with sinus tachycardia and premature ventricular complex.   Conclusion: Normal/unremarkable study with no evidence of significant arrhythmia  Past Medical History:  Diagnosis Date   ADHD (attention deficit hyperactivity disorder) 03/01/2011   Anxiety    Arthritis    neck    Asthma    as a child   Borderline personality disorder ([place]) 05/26/2019   Bronchospasm 01/19/2014   Formatting of this note might be different from the original. Prn albuterol, WARI   Chronic hypertension in  pregnancy 06/05/2020   Chronic neck pain 04/04/2019   Complex atypical endometrial hyperplasia 02/24/2019   Depression    Fibromyalgia    Hepatic steatosis    Hypertension    Hypertension during pregnancy in third trimester 04/02/2020   IBS (irritable bowel syndrome)    Newly diagnosed diabetes ([place]) 03/09/2019   Obesity    Panic attack    Torticollis 01/19/2014   Valium overdose     Past Surgical History:  Procedure Laterality Date   big toes ingrown toenail removed     CESAREAN SECTION N/A 05/30/2020   Procedure: CESAREAN SECTION;  Surgeon: Azucena Fallen, MD;  Location: [facility];  Service: Obstetrics;  Laterality: N/A;   DILATATION & CURETTAGE/HYSTEROSCOPY WITH MYOSURE N/A 02/24/2019   Procedure: DILATATION & CURETTAGE/HYSTEROSCOPY WITH MYOSURE;  Surgeon: Azucena Fallen, MD;  Location: [facility];  Service: Gynecology;  Laterality: N/A;   MOUTH SURGERY     "baby teeth removed,adult teeth  pulled down"   RADIOLOGY WITH ANESTHESIA N/A 06/07/2019   Procedure: MRI CERVICAL SPINE WITHOUT CONTRAST;  Surgeon: Radiologist, Medication, MD;  Location: [facility];  Service: Radiology;  Laterality: N/A;   wisdom teeth        OB History     Gravida  2   Para  2  Term  2   Preterm      AB      Living  2      SAB      IAB      Ectopic      Multiple      Live Births  2               Current Medications: Current Meds  Medication Sig   acetaminophen (TYLENOL) 500 MG tablet Take 2 tablets (1,000 mg total) by mouth every 6 (six) hours. (Patient taking differently: Take 1,000 mg by mouth as needed.)   albuterol (VENTOLIN HFA) 108 (90 Base) MCG/ACT inhaler Inhale 2 puffs into the lungs every 6 (six) hours as needed for wheezing or shortness of breath.   baclofen (LIORESAL) 10 MG tablet Take 1 tablet (10 mg total) by mouth 2 (two) times daily.   busPIRone (BUSPAR) 7.5 MG tablet Take 7.5 mg by mouth 3 (three) times daily.   butalbital-acetaminophen-caffeine  (FIORICET) 50-325-40 MG tablet Take 1-2 tablets by mouth every 6 (six) hours as needed for headache.   cetirizine (ZYRTEC) 10 MG tablet Take 10 mg by mouth daily.   famotidine (PEPCID) 20 MG tablet TAKE ONE TABLET BY MOUTH EVERY MORNING AND TAKE ONE TABLET BY MOUTH EVERY NIGHT AT BEDTIME   Ferrous Sulfate (IRON PO) Take by mouth daily.   furosemide (LASIX) 40 MG tablet Lasix 40 mg once daily for 7 days then every other day. (Patient taking differently: Take 40 mg by mouth every other day.)   hydrochlorothiazide (HYDRODIURIL) 25 MG tablet Take 1 tablet (25 mg total) by mouth daily.   metFORMIN (GLUCOPHAGE XR) 750 MG 24 hr tablet Take 1 tablet (750 mg total) by mouth daily with breakfast.   metoCLOPramide (REGLAN) 10 MG tablet Take 1 tablet (10 mg total) by mouth 3 (three) times daily before meals.   metoprolol tartrate (LOPRESSOR) 100 MG tablet Take 2 hours prior to CT   naproxen (NAPROSYN) 500 MG tablet TAKE ONE TABLET BY MOUTH TWICE A DAY AS NEEDED FOR MODERATE PAIN   omeprazole (PRILOSEC) 40 MG capsule TAKE ONE CAPSULE BY MOUTH DAILY   phentermine 37.5 MG capsule Take 1 capsule (37.5 mg total) by mouth every morning.   propranolol (INDERAL) 10 MG tablet Take 1 tablet (10 mg total) by mouth daily for 1 dose.   sertraline (ZOLOFT) 100 MG tablet Take 1 tablet by mouth daily.   valsartan (DIOVAN) 80 MG tablet Take 1 tablet (80 mg total) by mouth daily.   Vitamin D, Ergocalciferol, (DRISDOL) 1.25 MG (50000 UNIT) CAPS capsule TAKE ONE CAPSULE BY MOUTH ONCE WEEKLY   [DISCONTINUED] labetalol (NORMODYNE) 200 MG tablet Take 1 tablet (200 mg total) by mouth 3 (three) times daily.     Allergies:   Flexeril [cyclobenzaprine hcl] and Lorazepam   Social History   Socioeconomic History   Marital status: Single    Spouse name: Not on file   Number of children: Not on file   Years of education: Not on file   Highest education level: Not on file  Occupational History   Not on file  Tobacco Use    Smoking status: Former    Packs/day: 0.50    Years: 4.00    Pack years: 2.00    Types: Cigarettes    Quit date: 01/13/2005    Years since quitting: 16.1   Smokeless tobacco: Never  Vaping Use   Vaping Use: Never used  Substance and Sexual Activity  Alcohol use: Not Currently   Drug use: No   Sexual activity: Not Currently  Other Topics Concern   Not on file  Social History Narrative   Not on file   Social Determinants of Health   Financial Resource Strain: Low Risk    Difficulty of Paying Living Expenses: Not hard at all  Food Insecurity: Not on file  Transportation Needs: No Transportation Needs   Lack of Transportation (Medical): No   Lack of Transportation (Non-Medical): No  Physical Activity: Not on file  Stress: Not on file  Social Connections: Not on file      Family History  Problem Relation Age of Onset   Heart disease Mother    Depression Mother    High blood pressure Mother    Kidney disease Mother    Psychosis Mother    Hearing loss Father    Depression Father    Anxiety disorder Father    Asthma Daughter    Learning disabilities Daughter    Heart disease Maternal Grandfather    Other Brother        fatty liver   Multiple sclerosis Maternal Grandmother    COPD Paternal Grandmother    Heart failure Paternal Grandmother    Heart attack Paternal Grandmother    Stroke Paternal Grandmother    Lung cancer Paternal Grandfather       ROS:   Please see the history of present illness.     All other systems reviewed and are negative.   Labs/EKG Reviewed:    EKG:   EKG is was not ordered today.    Recent Labs: 06/15/2020: Magnesium 2.0 10/03/2020: ALT 26; BUN 12; Creatinine, Ser 0.90; Hemoglobin 12.2; Platelets 208.0; Potassium 4.0; Sodium 141; TSH 2.68   Recent Lipid Panel Lab Results  Component Value Date/Time   CHOL 167 10/03/2020 03:17 PM   TRIG (H) 10/03/2020 03:17 PM    475.0 Triglyceride is over 400; calculations on Lipids are invalid.    HDL 32.30 (L) 10/03/2020 03:17 PM   CHOLHDL 5 10/03/2020 03:17 PM   LDLDIRECT 89.0 10/03/2020 03:17 PM    Physical Exam:    VS:  BP 134/90    Pulse 96    Ht 5\' 6"  (1.676 m)    Wt 265 lb 6.4 oz (120.4 kg)    LMP 02/17/2021 (Exact Date)    SpO2 97%    BMI 42.84 kg/m     Wt Readings from Last 3 Encounters:  03/08/21 265 lb 6.4 oz (120.4 kg)  12/19/20 272 lb 3.2 oz (123.5 kg)  11/07/20 262 lb 1.6 oz (118.9 kg)     GEN:  Well nourished, well developed in no acute distress HEENT: Normal NECK: No JVD; No carotid bruits LYMPHATICS: No lymphadenopathy CARDIAC: RRR, no murmurs, rubs, gallops RESPIRATORY:  Clear to auscultation without rales, wheezing or rhonchi  ABDOMEN: Soft, non-tender, non-distended MUSCULOSKELETAL:  No edema; No deformity  SKIN: Warm and dry NEUROLOGIC:  Alert and oriented x 3 PSYCHIATRIC:  Normal affect    Risk Assessment/Risk Calculators:                 ASSESSMENT & PLAN:    Chest pain  Hypertension  Morbid obesity Depression  Type II Diabetes Mellitus   The symptoms chest pain is concerning, this patient does have intermediate risk for coronary artery disease and at this time I would like to pursue an ischemic evaluation in this patient.  Shared decision a coronary CTA at this time is appropriate.  I have discussed with the patient about the testing.  The patient has no IV contrast allergy and is agreeable to proceed with this test.  In terms of her antihypertensive giving her known diabetes now that she is off breast-feeding we will transition the patient medications.  We will taper her off the labetalol transition her to propranolol which will help with her palpitations as well as hopefully help with her anxiety. We will add an ARB valsartan 80 mg daily for her to get the kidney protective benefit as well in the setting of her diabetes.  We also talked about cardiovascular risk reduction as well as plans for proceeding with evaluation given her  symptoms.    Patient Instructions  Medication Instructions:  Your physician has recommended you make the following change in your medication:  STOP: Labetalol START: Propranolol 10 mg daily START: Valsartan 80 mg once daily Labetalol: Take 2 times daily for the next two days, then once day for two days, then stop. Start the propranolol after stopping Labetalol.     *If you need a refill on your cardiac medications before your next appointment, please call your pharmacy*   Lab Work: Your physician recommends that you return for lab work in:  TODAY: BMET, [place] If you have labs (blood work) drawn today and your tests are completely normal, you will receive your results only by: MyChart Message (if you have [place]) OR A paper copy in the mail If you have any lab test that is abnormal or we need to change your treatment, we will call you to review the results.   Testing/Procedures:   Your cardiac CT will be scheduled at one of the below locations:   [facility] [address] [place], [place] [address] [phone]   If scheduled at [facility], please arrive at the [facility] and Children's Entrance (Entrance C2) of [facility] 30 minutes prior to test start time. You can use the FREE valet parking offered at entrance C (encouraged to control the heart rate for the test)  Proceed to the [facility] Radiology Department (first floor) to check-in and test prep.  All radiology patients and guests should use entrance C2 at [facility], accessed from [facility], even though the hospital's physical address listed is [address].     Please follow these instructions carefully (unless otherwise directed):   On the Night Before the Test: Be sure to Drink plenty of water. Do not consume any caffeinated/decaffeinated beverages or chocolate 12 hours prior to your test. Do not take any antihistamines 12 hours prior to your  test.  On the Day of the Test: Drink plenty of water until 1 hour prior to the test. Do not eat any food 4 hours prior to the test. You may take your regular medications prior to the test.  Take metoprolol (Lopressor) two hours prior to test. HOLD Propranolol the day of test HOLD Furosemide/Hydrochlorothiazide morning of the test. FEMALES- please wear underwire-free bra if available, avoid dresses & tight clothing       After the Test: Drink plenty of water. After receiving IV contrast, you may experience a mild flushed feeling. This is normal. On occasion, you may experience a mild rash up to 24 hours after the test. This is not dangerous. If this occurs, you can take Benadryl 25 mg and increase your fluid intake. If you experience trouble breathing, this can be serious. If it is severe call 911  IMMEDIATELY. If it is mild, please call our office. If you take any of these medications: Glipizide/Metformin, Avandament, Glucavance, please do not take 48 hours after completing test unless otherwise instructed.  We will call to schedule your test 2-4 weeks out understanding that some insurance companies will need an authorization prior to the service being performed.   For non-scheduling related questions, please contact the cardiac imaging nurse navigator should you have any questions/concerns: Marchia Bond, Cardiac Imaging Nurse Navigator Gordy Clement, Cardiac Imaging Nurse Navigator [facility] Direct Office Dial: [phone]   For scheduling needs, including cancellations and rescheduling, please call [place], [phone].      Follow-Up: At [facility], you and your health needs are our priority.  As part of our continuing mission to provide you with exceptional heart care, we have created designated Provider Care Teams.  These Care Teams include your primary Cardiologist (physician) and Advanced Practice Providers (APPs -  Physician Assistants and  Nurse Practitioners) who all work together to provide you with the care you need, when you need it.  We recommend signing up for the patient portal called "MyChart".  Sign up information is provided on this After Visit Summary.  MyChart is used to connect with patients for Virtual Visits (Telemedicine).  Patients are able to view lab/test results, encounter notes, upcoming appointments, etc.  Non-urgent messages can be sent to your provider as well.   To learn more about what you can do with MyChart, go to [email or web address].    Your next appointment:   8 week(s)  The format for your next appointment:   Virtual Visit   Provider:   Berniece Salines, DO     Other Instructions     Dispo:  No follow-ups on file.   Medication Adjustments/Labs and Tests Ordered: Current medicines are reviewed at length with the patient today.  Concerns regarding medicines are outlined above.  Tests Ordered: Orders Placed This Encounter  Procedures   CT CORONARY MORPH W/CTA COR W/SCORE W/CA W/CM &/OR WO/CM   Basic Metabolic Panel (BMET)   Magnesium   Medication Changes: Meds ordered this encounter  Medications   propranolol (INDERAL) 10 MG tablet    Sig: Take 1 tablet (10 mg total) by mouth daily for 1 dose.    Dispense:  90 tablet    Refill:  3   valsartan (DIOVAN) 80 MG tablet    Sig: Take 1 tablet (80 mg total) by mouth daily.    Dispense:  90 tablet    Refill:  3   metoprolol tartrate (LOPRESSOR) 100 MG tablet    Sig: Take 2 hours prior to CT    Dispense:  1 tablet    Refill:  0

## 2021-03-11 ENCOUNTER — Telehealth: Payer: Self-pay | Admitting: *Deleted

## 2021-03-11 ENCOUNTER — Encounter: Payer: Self-pay | Admitting: *Deleted

## 2021-03-11 NOTE — Telephone Encounter (Signed)
Paperwork was not received.  Spoke with the pharmacist for insurance info and PA was sent via [email or web address]-patient informed via Mychart message.

## 2021-03-11 NOTE — Telephone Encounter (Signed)
-----   Message from Wynn Banker, MD sent at 03/09/2021  6:33 PM EST ----- Was there prior auth paperwork for ozempic? You said that pharmacy said her insurance doesn't cover weight loss meds, but she does have diabetes history and is already on metformin, so they should cover for diabetes diagnosis.

## 2021-03-12 ENCOUNTER — Encounter: Payer: Self-pay | Admitting: Family Medicine

## 2021-03-15 NOTE — Telephone Encounter (Signed)
Fax received stating the request was denied and placed on PCP's desk. ?

## 2021-03-19 LAB — BASIC METABOLIC PANEL WITH GFR
BUN/Creatinine Ratio: 27 — ABNORMAL HIGH (ref 9–23)
BUN: 17 mg/dL (ref 6–20)
CO2: 25 mmol/L (ref 20–29)
Calcium: 9.6 mg/dL (ref 8.7–10.2)
Chloride: 101 mmol/L (ref 96–106)
Creatinine, Ser: 0.63 mg/dL (ref 0.57–1.00)
Glucose: 85 mg/dL (ref 70–99)
Potassium: 4.4 mmol/L (ref 3.5–5.2)
Sodium: 142 mmol/L (ref 134–144)
eGFR: 117 mL/min/1.73

## 2021-03-19 LAB — MAGNESIUM: Magnesium: 1.9 mg/dL (ref 1.6–2.3)

## 2021-03-23 ENCOUNTER — Other Ambulatory Visit: Payer: Self-pay | Admitting: Family Medicine

## 2021-03-29 ENCOUNTER — Encounter: Payer: Self-pay | Admitting: Family Medicine

## 2021-03-29 ENCOUNTER — Other Ambulatory Visit: Payer: BC Managed Care – PPO

## 2021-03-29 NOTE — Telephone Encounter (Signed)
Patient rescheduled

## 2021-04-01 ENCOUNTER — Other Ambulatory Visit (INDEPENDENT_AMBULATORY_CARE_PROVIDER_SITE_OTHER): Payer: BC Managed Care – PPO

## 2021-04-01 DIAGNOSIS — I10 Essential (primary) hypertension: Secondary | ICD-10-CM | POA: Diagnosis not present

## 2021-04-01 DIAGNOSIS — R7301 Impaired fasting glucose: Secondary | ICD-10-CM | POA: Diagnosis not present

## 2021-04-01 LAB — COMPREHENSIVE METABOLIC PANEL
ALT: 26 U/L (ref 0–35)
AST: 15 U/L (ref 0–37)
Albumin: 4.1 g/dL (ref 3.5–5.2)
Alkaline Phosphatase: 45 U/L (ref 39–117)
BUN: 17 mg/dL (ref 6–23)
CO2: 26 mEq/L (ref 19–32)
Calcium: 9.2 mg/dL (ref 8.4–10.5)
Chloride: 103 mEq/L (ref 96–112)
Creatinine, Ser: 0.58 mg/dL (ref 0.40–1.20)
GFR: 115.39 mL/min (ref >60.00)
Glucose, Bld: 125 mg/dL — ABNORMAL HIGH (ref 70–99)
Potassium: 3.7 mEq/L (ref 3.5–5.1)
Sodium: 138 mEq/L (ref 135–145)
Total Bilirubin: 0.3 mg/dL (ref 0.2–1.2)
Total Protein: 6.3 g/dL (ref 6.0–8.3)

## 2021-04-01 LAB — CBC WITH DIFFERENTIAL/PLATELET
Basophils Absolute: 0 10*3/uL (ref 0.0–0.1)
Basophils Relative: 0.3 % (ref 0.0–3.0)
Eosinophils Absolute: 0.1 10*3/uL (ref 0.0–0.7)
Eosinophils Relative: 1.9 % (ref 0.0–5.0)
HCT: 36.2 % (ref 36.0–46.0)
Hemoglobin: 12.4 g/dL (ref 12.0–15.0)
Lymphocytes Relative: 20.6 % (ref 12.0–46.0)
Lymphs Abs: 1.4 10*3/uL (ref 0.7–4.0)
MCHC: 34.3 g/dL (ref 30.0–36.0)
MCV: 87.5 fl (ref 78.0–100.0)
Monocytes Absolute: 0.4 10*3/uL (ref 0.1–1.0)
Monocytes Relative: 5 % (ref 3.0–12.0)
Neutro Abs: 5.1 10*3/uL (ref 1.4–7.7)
Neutrophils Relative %: 72.2 % (ref 43.0–77.0)
Platelets: 176 10*3/uL (ref 150.0–400.0)
RBC: 4.14 Mil/uL (ref 3.87–5.11)
RDW: 14.2 % (ref 11.5–15.5)
WBC: 7 10*3/uL (ref 4.0–10.5)

## 2021-04-01 LAB — HEMOGLOBIN A1C: Hgb A1c MFr Bld: 6 % (ref 4.6–6.5)

## 2021-04-04 ENCOUNTER — Telehealth: Payer: Self-pay | Admitting: *Deleted

## 2021-04-04 ENCOUNTER — Encounter: Payer: Self-pay | Admitting: Family Medicine

## 2021-04-04 ENCOUNTER — Telehealth: Payer: BC Managed Care – PPO | Admitting: Family Medicine

## 2021-04-04 DIAGNOSIS — J069 Acute upper respiratory infection, unspecified: Secondary | ICD-10-CM | POA: Diagnosis not present

## 2021-04-04 MED ORDER — BENZONATATE 100 MG PO CAPS: 100.0000 mg | ORAL_CAPSULE | Freq: Two times a day (BID) | ORAL | 0 refills | Status: DC | PRN

## 2021-04-04 MED ORDER — FLUTICASONE PROPIONATE 50 MCG/ACT NA SUSP: 2.0000 | Freq: Every day | NASAL | 0 refills | Status: DC

## 2021-04-04 MED ORDER — OZEMPIC (0.25 OR 0.5 MG/DOSE) 2 MG/1.5ML ~~LOC~~ SOPN
0.2500 mg | PEN_INJECTOR | SUBCUTANEOUS | 0 refills | Status: DC
Start: 2021-04-04 — End: 2021-05-08

## 2021-04-04 NOTE — Progress Notes (Signed)

## 2021-04-04 NOTE — Telephone Encounter (Signed)
Prior auth for Ozempic 0.25mg  was sent to [id number] pending review by the patient's insurance. ?

## 2021-04-11 ENCOUNTER — Other Ambulatory Visit: Payer: Self-pay | Admitting: Family Medicine

## 2021-04-12 ENCOUNTER — Telehealth: Payer: BC Managed Care – PPO | Admitting: Physician Assistant

## 2021-04-12 ENCOUNTER — Encounter: Payer: Self-pay | Admitting: Family Medicine

## 2021-04-12 ENCOUNTER — Telehealth (HOSPITAL_COMMUNITY): Payer: Self-pay | Admitting: *Deleted

## 2021-04-12 DIAGNOSIS — F419 Anxiety disorder, unspecified: Secondary | ICD-10-CM

## 2021-04-12 DIAGNOSIS — R110 Nausea: Secondary | ICD-10-CM | POA: Diagnosis not present

## 2021-04-12 MED ORDER — ONDANSETRON HCL 4 MG PO TABS: 4.0000 mg | ORAL_TABLET | Freq: Three times a day (TID) | ORAL | 0 refills | Status: DC | PRN

## 2021-04-12 NOTE — Telephone Encounter (Signed)
Attempted to call patient regarding upcoming cardiac CT appointment. °Left message on voicemail with name and callback number ° °Anjelo Pullman RN Navigator Cardiac Imaging °[facility] °[phone] Office °[phone] Cell ° °

## 2021-04-12 NOTE — Progress Notes (Signed)
E-Visit for Nausea and Vomiting  ? ?We are sorry that you are not feeling well. Here is how we plan to help! ? ?Based on what you have shared with me it looks like you have nausea and vomiting from anxiety. Although nausea and vomiting can make you feel miserable, it's important to remember that these are not diseases, but rather symptoms of an underlying illness.  When we treat short term symptoms, we always caution that any symptoms that persist should be fully evaluated in a medical office. ? ?I have prescribed a medication that will help alleviate your symptoms and allow you to stay hydrated: ? ?Zofran 4 mg 1 tablet every 8 hours as needed for nausea and vomiting ?Could consider adding prilosec (omeprazole) OTC once daily to decrease stomach acid production that can be increased during stress and anxiety. ? ?HOME CARE: ?Drink clear liquids.  This is very important! Dehydration (the lack of fluid) can lead to a serious complication.  Start off with 1 tablespoon every 5 minutes for 8 hours. ?You may begin eating bland foods after 8 hours without vomiting.  Start with saltine crackers, white bread, rice, mashed potatoes, applesauce. ?After 48 hours on a bland diet, you may resume a normal diet. ?Try to go to sleep.  Sleep often empties the stomach and relieves the need to vomit. ? ?GET HELP RIGHT AWAY IF: ? ?Your symptoms do not improve or worsen within 2 days after treatment. ?You have a fever for over 3 days. ?You cannot keep down fluids after trying the medication. ? ?MAKE SURE YOU: ? ?Understand these instructions. ?Will watch your condition. ?Will get help right away if you are not doing well or get worse. ? ?  ?Thank you for choosing an e-visit. ? ?Your e-visit answers were reviewed by a board certified advanced clinical practitioner to complete your personal care plan. Depending upon the condition, your plan could have included both over the counter or prescription medications. ? ?Please review your pharmacy  choice. Make sure the pharmacy is open so you can pick up prescription now. If there is a problem, you may contact your provider through [employer] and have the prescription routed to another pharmacy.  Your safety is important to [place]. If you have drug allergies check your prescription carefully.  ? ?For the next 24 hours you can use MyChart to ask questions about today's visit, request a non-urgent call back, or ask for a work or school excuse. ?You will get an email in the next two days asking about your experience. I hope that your e-visit has been valuable and will speed your recovery. ? ?I provided 5 minutes of non face-to-face time during this encounter for chart review and documentation.  ? ?

## 2021-04-13 ENCOUNTER — Other Ambulatory Visit: Payer: Self-pay | Admitting: Family Medicine

## 2021-04-13 MED ORDER — BUSPIRONE HCL 7.5 MG PO TABS: 7.5000 mg | ORAL_TABLET | Freq: Three times a day (TID) | ORAL | 1 refills | Status: DC

## 2021-04-13 MED ORDER — HYDROXYZINE HCL 25 MG PO TABS: 25.0000 mg | ORAL_TABLET | Freq: Three times a day (TID) | ORAL | 2 refills | Status: DC | PRN

## 2021-04-15 ENCOUNTER — Ambulatory Visit (HOSPITAL_COMMUNITY): Admission: RE | Admit: 2021-04-15 | Payer: BC Managed Care – PPO | Source: Ambulatory Visit

## 2021-04-17 ENCOUNTER — Encounter: Payer: Self-pay | Admitting: Family Medicine

## 2021-04-24 ENCOUNTER — Encounter: Payer: Self-pay | Admitting: Family Medicine

## 2021-04-26 ENCOUNTER — Telehealth (HOSPITAL_COMMUNITY): Payer: Self-pay | Admitting: Emergency Medicine

## 2021-04-26 NOTE — Telephone Encounter (Signed)
Reaching out to patient to offer assistance regarding upcoming cardiac imaging study; pt verbalizes understanding of appt date/time, parking situation and where to check in, pre-test NPO status and medications ordered, and verified current allergies; name and call back number provided for further questions should they arise ?Rockwell Alexandria RN Navigator Cardiac Imaging ?[facility] ?[phone] office ?[phone] cell ? ?Deep veins ?100mg  metoprolol tartrate ?Arrival 400 ?

## 2021-04-29 ENCOUNTER — Telehealth (HOSPITAL_COMMUNITY): Payer: Self-pay | Admitting: *Deleted

## 2021-04-29 ENCOUNTER — Ambulatory Visit (HOSPITAL_COMMUNITY): Admission: RE | Admit: 2021-04-29 | Payer: BC Managed Care – PPO | Source: Ambulatory Visit

## 2021-04-29 NOTE — Telephone Encounter (Signed)
Patient calling to say she feels sick to her stomach and cannot make her CCTA appointment today (April 17). New appointment made for April 25 at 4:30pm ? ?Gordy Clement RN Navigator Cardiac Imaging ?[facility] ?[phone] Office ?[phone] Cell ? ?

## 2021-05-02 ENCOUNTER — Other Ambulatory Visit: Payer: Self-pay | Admitting: Family Medicine

## 2021-05-06 ENCOUNTER — Telehealth (HOSPITAL_COMMUNITY): Payer: Self-pay | Admitting: Emergency Medicine

## 2021-05-06 NOTE — Telephone Encounter (Signed)
Attempted to call patient regarding upcoming cardiac CT appointment. °Left message on voicemail with name and callback number °Kamerin Grumbine RN Navigator Cardiac Imaging °[facility] °[phone] Office °[phone] Cell ° °

## 2021-05-07 ENCOUNTER — Encounter (HOSPITAL_COMMUNITY): Payer: Self-pay

## 2021-05-07 ENCOUNTER — Ambulatory Visit (HOSPITAL_COMMUNITY)
Admission: RE | Admit: 2021-05-07 | Discharge: 2021-05-07 | Disposition: A | Discharge status: Home / Self Care | Payer: BC Managed Care – PPO | Source: Ambulatory Visit | Attending: Cardiology | Admitting: Cardiology

## 2021-05-07 DIAGNOSIS — R079 Chest pain, unspecified: Secondary | ICD-10-CM | POA: Diagnosis present

## 2021-05-07 MED ORDER — METOPROLOL TARTRATE 5 MG/5ML IV SOLN
INTRAVENOUS | Status: AC
  Administered 2021-05-07: 5 mg via INTRAVENOUS
  Filled 2021-05-07: qty 5

## 2021-05-07 MED ORDER — NITROGLYCERIN 0.4 MG SL SUBL
0.8000 mg | SUBLINGUAL_TABLET | Freq: Once | SUBLINGUAL | Status: AC
Start: 2021-05-07 — End: 2021-05-07
  Administered 2021-05-07: 0.8 mg via SUBLINGUAL

## 2021-05-07 MED ORDER — IOHEXOL 350 MG/ML SOLN
100.0000 mL | Freq: Once | INTRAVENOUS | Status: AC | PRN
  Administered 2021-05-07: 100 mL via INTRAVENOUS

## 2021-05-07 MED ORDER — NITROGLYCERIN 0.4 MG SL SUBL
SUBLINGUAL_TABLET | SUBLINGUAL | Status: AC
  Filled 2021-05-07: qty 2

## 2021-05-07 MED ORDER — METOPROLOL TARTRATE 5 MG/5ML IV SOLN: 5.0000 mg | Freq: Once | INTRAVENOUS | Status: AC

## 2021-05-08 ENCOUNTER — Ambulatory Visit (INDEPENDENT_AMBULATORY_CARE_PROVIDER_SITE_OTHER): Payer: BC Managed Care – PPO | Admitting: Family Medicine

## 2021-05-08 VITALS — BP 118/82 | HR 85 | Temp 97.8°F | Ht 66.0 in | Wt 280.7 lb

## 2021-05-08 DIAGNOSIS — R7989 Other specified abnormal findings of blood chemistry: Secondary | ICD-10-CM | POA: Diagnosis not present

## 2021-05-08 DIAGNOSIS — R232 Flushing: Secondary | ICD-10-CM

## 2021-05-08 DIAGNOSIS — E559 Vitamin D deficiency, unspecified: Secondary | ICD-10-CM | POA: Diagnosis not present

## 2021-05-08 DIAGNOSIS — E119 Type 2 diabetes mellitus without complications: Secondary | ICD-10-CM

## 2021-05-08 DIAGNOSIS — E1169 Type 2 diabetes mellitus with other specified complication: Secondary | ICD-10-CM

## 2021-05-08 DIAGNOSIS — M436 Torticollis: Secondary | ICD-10-CM | POA: Diagnosis not present

## 2021-05-08 DIAGNOSIS — F32A Depression, unspecified: Secondary | ICD-10-CM

## 2021-05-08 DIAGNOSIS — E785 Hyperlipidemia, unspecified: Secondary | ICD-10-CM

## 2021-05-08 DIAGNOSIS — F419 Anxiety disorder, unspecified: Secondary | ICD-10-CM

## 2021-05-08 MED ORDER — OMEPRAZOLE 40 MG PO CPDR: 40.0000 mg | DELAYED_RELEASE_CAPSULE | Freq: Every day | ORAL | 1 refills | Status: DC

## 2021-05-08 MED ORDER — OZEMPIC (0.25 OR 0.5 MG/DOSE) 2 MG/1.5ML ~~LOC~~ SOPN: 0.5000 mg | PEN_INJECTOR | SUBCUTANEOUS | 2 refills | Status: DC

## 2021-05-08 NOTE — Progress Notes (Signed)
?Katie [name] ?DOB: 1985/04/28 ?Encounter date: 05/08/2021 ? ?This is a 36 y.o. female who presents with ?Chief Complaint  ?Patient presents with  ? Follow-up  ? ? ?History of present illness: ?Patient has had some concerns with fatigue as well as anxiety which she has been sending my chart messages for.  Also feeling really hot/sweating.  She was advised to come in to further discuss symptoms. ? ?*still feeling hot. Still having sweating episodes. Warm most of the time, but sometimes comes on very strong. Everyone is cold, using heater and she needs fan. Feels hot and needs to loosen tie to breathe. Not heart racing, no headache. Does feel like it is worse now. ? ?*stress level is high. Did buy a house in [place]. There is a lot she has to do with house at this point. Does feel like she has a lot on her plate.  ? ?*even on days when she sleeps she is tired through the day. Falls asleep at work. Wakes tired.  ? ? ?Allergies  ?Allergen Reactions  ? Flexeril [Cyclobenzaprine Hcl] Shortness Of Breath  ? Lorazepam   ?  Other reaction(s): Other ?Neck spasm  ? ?Current Meds  ?Medication Sig  ? acetaminophen (TYLENOL) 500 MG tablet Take 2 tablets (1,000 mg total) by mouth every 6 (six) hours. (Patient taking differently: Take 1,000 mg by mouth as needed.)  ? albuterol (VENTOLIN HFA) 108 (90 Base) MCG/ACT inhaler Inhale 2 puffs into the lungs every 6 (six) hours as needed for wheezing or shortness of breath.  ? baclofen (LIORESAL) 10 MG tablet Take 1 tablet (10 mg total) by mouth 2 (two) times daily.  ? benzonatate (TESSALON) 100 MG capsule Take 1 capsule (100 mg total) by mouth 2 (two) times daily as needed for cough.  ? blood glucose meter kit and supplies KIT Use as directed twice a day  ? Blood Glucose Monitoring Suppl (CONTOUR NEXT ONE) KIT Use as directed  ? busPIRone (BUSPAR) 7.5 MG tablet Take 1 tablet (7.5 mg total) by mouth 3 (three) times daily.  ? butalbital-acetaminophen-caffeine (FIORICET)  50-325-40 MG tablet Take 1-2 tablets by mouth every 6 (six) hours as needed for headache.  ? cetirizine (ZYRTEC) 10 MG tablet Take 10 mg by mouth daily.  ? famotidine (PEPCID) 20 MG tablet TAKE ONE TABLET BY MOUTH EVERY MORNING AND TAKE ONE TABLET BY MOUTH EVERY NIGHT AT BEDTIME  ? Ferrous Sulfate (IRON PO) Take by mouth daily.  ? fluticasone (FLONASE) 50 MCG/ACT nasal spray Place 2 sprays into both nostrils daily.  ? furosemide (LASIX) 40 MG tablet Lasix 40 mg once daily for 7 days then every other day. (Patient taking differently: Take 40 mg by mouth every other day.)  ? glucose blood (CONTOUR NEXT TEST) test strip Use as instructed  ? hydrOXYzine (ATARAX) 25 MG tablet Take 1 tablet (25 mg total) by mouth 3 (three) times daily as needed for anxiety.  ? metFORMIN (GLUCOPHAGE XR) 750 MG 24 hr tablet Take 1 tablet (750 mg total) by mouth daily with breakfast.  ? metoCLOPramide (REGLAN) 10 MG tablet Take 1 tablet (10 mg total) by mouth 3 (three) times daily before meals.  ? naproxen (NAPROSYN) 500 MG tablet TAKE ONE TABLET BY MOUTH TWICE A DAY AS NEEDED FOR MODERATE PAIN  ? ondansetron (ZOFRAN) 4 MG tablet Take 1 tablet (4 mg total) by mouth every 8 (eight) hours as needed for nausea or vomiting.  ? Semaglutide,0.25 or 0.5MG/DOS, (OZEMPIC, 0.25 OR 0.5 MG/DOSE,) 2 MG/1.5ML  SOPN Inject 0.5 mg into the skin once a week.  ? sertraline (ZOLOFT) 100 MG tablet Take 1 tablet by mouth daily.  ? valsartan (DIOVAN) 80 MG tablet Take 1 tablet (80 mg total) by mouth daily.  ? Vitamin D, Ergocalciferol, (DRISDOL) 1.25 MG (50000 UNIT) CAPS capsule TAKE ONE CAPSULE BY MOUTH ONCE WEEKLY  ? [DISCONTINUED] metoprolol tartrate (LOPRESSOR) 100 MG tablet Take 2 hours prior to CT  ? [DISCONTINUED] omeprazole (PRILOSEC) 40 MG capsule TAKE ONE CAPSULE BY MOUTH DAILY  ? [DISCONTINUED] Semaglutide,0.25 or 0.5MG/DOS, (OZEMPIC, 0.25 OR 0.5 MG/DOSE,) 2 MG/1.5ML SOPN Inject 0.25 mg into the skin once a week.  ? ? ?Review of Systems   ?Constitutional:  Positive for fatigue. Negative for chills and fever.  ?Respiratory:  Negative for cough, chest tightness, shortness of breath and wheezing.   ?Cardiovascular:  Negative for chest pain, palpitations and leg swelling.  ?Musculoskeletal:  Positive for neck pain and neck stiffness.  ?Psychiatric/Behavioral:  Positive for agitation. The patient is nervous/anxious.   ?     Depressed mood. Stressed financially.  ? ?Objective: ? ?BP 118/82 (BP Location: Left Arm, Patient Position: Sitting, Cuff Size: Large)   Pulse 85   Temp 97.8 ?F (36.6 ?C) (Oral)   Ht _0  (1.676 m)   Wt 280 lb 11.2 oz (127.3 kg)   LMP 03/19/2021 (Exact Date)   SpO2 98%   BMI 45.31 kg/m?   Weight: 280 lb 11.2 oz (127.3 kg)  ? ?BP Readings from Last 3 Encounters:  ?05/08/21 118/82  ?05/07/21 137/88  ?03/08/21 134/90  ? ?Wt Readings from Last 3 Encounters:  ?05/08/21 280 lb 11.2 oz (127.3 kg)  ?03/08/21 265 lb 6.4 oz (120.4 kg)  ?12/19/20 272 lb 3.2 oz (123.5 kg)  ? ? ?Physical Exam ?Constitutional:   ?   General: She is not in acute distress. ?   Appearance: She is well-developed. She is obese.  ?Neck:  ?   Comments: Severe muscle spasms neck; left pull torticollis ?Cardiovascular:  ?   Rate and Rhythm: Normal rate and regular rhythm.  ?   Heart sounds: Normal heart sounds. No murmur heard. ?  No friction rub.  ?Pulmonary:  ?   Effort: Pulmonary effort is normal. No respiratory distress.  ?   Breath sounds: Normal breath sounds. No wheezing or rales.  ?Musculoskeletal:  ?   Right lower leg: No edema.  ?   Left lower leg: No edema.  ?Neurological:  ?   Mental Status: She is alert and oriented to person, place, and time.  ?Psychiatric:     ?   Attention and Perception: Attention normal.     ?   Mood and Affect: Mood is depressed. Affect is flat.     ?   Speech: Speech normal.     ?   Behavior: Behavior normal.     ?   Thought Content: Thought content does not include homicidal or suicidal plan.  ? ? ?Assessment/Plan ? ? ?1. Hot  flashes ?We will recheck blood work today. ? ?2. Torticollis ?She had multiple interruptions in her care from Ortho with addressing neck treatment.  She has been through different medication trials as well as physical therapy without any improvement in the past.  We discussed referral to sports medicine to work on addressing her continued issues with the neck.  This is something that bothers her continually and daily. ?- Ambulatory referral to Sports Medicine ? ?3. Vitamin D deficiency ?- VITAMIN D 25 Hydroxy (  Vit-D Deficiency, Fractures); Future ? ?4. Low T4 ?- TSH; Future ?- T4, free; Future ?- T3, free; Future ? ?5. Hyperlipidemia associated with type 2 diabetes mellitus ([place]) ?- Lipid panel; Future ? ?6/7.  Depression/anxiety.  We discussed doing a GeneSight testing today to help direct treatment.  I have been encouraging her to establish with a new psychiatrist, but she has not had time to do this as she is working overtime.  Also encouraged her to follow-up with therapy, but she again feels like she does not have time for this.  She has a poor support system and significant conflict with her primary caregiver of her child, her mother.  We will see if there are any alternative medications that may better address mood taking ?Potential for side effects. ? ?8.  For diabetes, we are going to increase her Ozempic to 0.5 mg weekly.  We will continue to increase as tolerated and needed to help with blood sugar control as well as weight loss.  Discussed importance of keeping healthy snacks on hand at work if she does not have to resort to bending machines for food. ? ?Return for pending lab or imaging results. ? ? ?Micheline Rough, MD ?

## 2021-05-08 NOTE — Patient Instructions (Signed)
*  after next dose of ozempic 0.25, please increase to 0.5mg  weekly dose ?*think through healthy snacks you can keep on hand.  ?*follow up with Dr. Katrinka Blazing to discuss neck treatment options ? ?

## 2021-05-27 ENCOUNTER — Encounter: Payer: Self-pay | Admitting: Family Medicine

## 2021-05-29 ENCOUNTER — Encounter: Payer: Self-pay | Admitting: Family Medicine

## 2021-05-31 MED ORDER — DESVENLAFAXINE SUCCINATE ER 50 MG PO TB24: 50.0000 mg | ORAL_TABLET | Freq: Every day | ORAL | 1 refills | Status: DC

## 2021-06-01 ENCOUNTER — Other Ambulatory Visit: Payer: Self-pay | Admitting: Cardiology

## 2021-06-02 ENCOUNTER — Other Ambulatory Visit: Payer: Self-pay | Admitting: Family Medicine

## 2021-06-09 ENCOUNTER — Other Ambulatory Visit: Payer: Self-pay | Admitting: Cardiology

## 2021-06-15 ENCOUNTER — Other Ambulatory Visit: Payer: Self-pay | Admitting: Family Medicine

## 2021-06-18 NOTE — Progress Notes (Unsigned)
Katie [name] [address] [place] Phone: [phone] Subjective:   IVilma Meckel, am serving as a scribe for Dr. Hulan Saas.  I'm seeing this patient by the request  of:  Caren Macadam, MD (Inactive)  CC: neck twisting   [id number]  Katie [name] is a 36 y.o. female coming in with complaint of torticollis. Neck spasms, tried PT. Chronic condition. Left arm pain location triceps. Tender to touch and feels like it tightens.  Reviewed patient's chart and patient did have some degenerative disc disease of the neck with some nerve root impingement on the left side.  This was independently visualized by me.  Patient starting to have increasing discomfort and pain again.  Noticing it more as she takes care of her 38-year-old.  Patient states it is affecting her activities and can noticed that now she has significant difficulty with rotation noted.       Past Medical History:  Diagnosis Date   ADHD (attention deficit hyperactivity disorder) 03/01/2011   Anxiety    Arthritis    neck    Asthma    as a child   Borderline personality disorder ([place]) 05/26/2019   Bronchospasm 01/19/2014   Formatting of this note might be different from the original. Prn albuterol, WARI   Chronic hypertension in pregnancy 06/05/2020   Chronic neck pain 04/04/2019   Complex atypical endometrial hyperplasia 02/24/2019   Depression    Fibromyalgia    Hepatic steatosis    Hypertension    Hypertension during pregnancy in third trimester 04/02/2020   IBS (irritable bowel syndrome)    Newly diagnosed diabetes ([place]) 03/09/2019   Obesity    Panic attack    Torticollis 01/19/2014   Valium overdose    Past Surgical History:  Procedure Laterality Date   big toes ingrown toenail removed     CESAREAN SECTION N/A 05/30/2020   Procedure: CESAREAN SECTION;  Surgeon: Azucena Fallen, MD;  Location: [facility];  Service: Obstetrics;  Laterality: N/A;    DILATATION & CURETTAGE/HYSTEROSCOPY WITH MYOSURE N/A 02/24/2019   Procedure: DILATATION & CURETTAGE/HYSTEROSCOPY WITH MYOSURE;  Surgeon: Azucena Fallen, MD;  Location: [facility];  Service: Gynecology;  Laterality: N/A;   MOUTH SURGERY     "baby teeth removed,adult teeth  pulled down"   RADIOLOGY WITH ANESTHESIA N/A 06/07/2019   Procedure: MRI CERVICAL SPINE WITHOUT CONTRAST;  Surgeon: Radiologist, Medication, MD;  Location: [facility];  Service: Radiology;  Laterality: N/A;   wisdom teeth     Social History   Socioeconomic History   Marital status: Single    Spouse name: Not on file   Number of children: Not on file   Years of education: Not on file   Highest education level: Some college, no degree  Occupational History   Not on file  Tobacco Use   Smoking status: Former    Packs/day: 0.50    Years: 4.00    Pack years: 2.00    Types: Cigarettes    Quit date: 01/13/2005    Years since quitting: 16.4   Smokeless tobacco: Never  Vaping Use   Vaping Use: Never used  Substance and Sexual Activity   Alcohol use: Not Currently   Drug use: No   Sexual activity: Not Currently  Other Topics Concern   Not on file  Social History Narrative   Not on file   Social Determinants of Health   Financial Resource Strain: Medium Risk  Difficulty of Paying Living Expenses: Somewhat hard  Food Insecurity: Food Insecurity Present   Worried About [employer] in the Last Year: Sometimes true   Ran Out of Food in the Last Year: Sometimes true  Transportation Needs: No Transportation Needs   Lack of Transportation (Medical): No   Lack of Transportation (Non-Medical): No  Physical Activity: Sufficiently Active   Days of Exercise per Week: 4 days   Minutes of Exercise per Session: 40 min  Stress: Stress Concern Present   Feeling of Stress : Very much  Social Connections: Socially Isolated   Frequency of Communication with Friends and Family: More than three times a week    Frequency of Social Gatherings with Friends and Family: Three times a week   Attends Religious Services: Never   Active Member of Clubs or Organizations: No   Attends Music therapist: Not on file   Marital Status: Never married   Allergies  Allergen Reactions   Flexeril [Cyclobenzaprine Hcl] Shortness Of Breath   Lorazepam     Other reaction(s): Other Neck spasm   Family History  Problem Relation Age of Onset   Heart disease Mother    Depression Mother    High blood pressure Mother    Kidney disease Mother    Psychosis Mother    Hearing loss Father    Depression Father    Anxiety disorder Father    Asthma Daughter    Learning disabilities Daughter    Heart disease Maternal Grandfather    Other Brother        fatty liver   Multiple sclerosis Maternal Grandmother    COPD Paternal Grandmother    Heart failure Paternal Grandmother    Heart attack Paternal Grandmother    Stroke Paternal Grandmother    Lung cancer Paternal Grandfather     Current Outpatient Medications (Endocrine & Metabolic):    metFORMIN (GLUCOPHAGE XR) 750 MG 24 hr tablet, Take 1 tablet (750 mg total) by mouth daily with breakfast.   Semaglutide,0.25 or 0.5MG /DOS, (OZEMPIC, 0.25 OR 0.5 MG/DOSE,) 2 MG/1.5ML SOPN, Inject 0.5 mg into the skin once a week.  Current Outpatient Medications (Cardiovascular):    furosemide (LASIX) 40 MG tablet, TAKE ONE TABLET BY MOUTH DAILY FOR 7 DAYS THEN USE EVERY OTHER DAY   hydrochlorothiazide (HYDRODIURIL) 25 MG tablet, Take 1 tablet (25 mg total) by mouth daily.   propranolol (INDERAL) 10 MG tablet, Take 1 tablet (10 mg total) by mouth daily for 1 dose.   valsartan (DIOVAN) 80 MG tablet, Take 1 tablet (80 mg total) by mouth daily.  Current Outpatient Medications (Respiratory):    albuterol (VENTOLIN HFA) 108 (90 Base) MCG/ACT inhaler, Inhale 2 puffs into the lungs every 6 (six) hours as needed for wheezing or shortness of breath.   benzonatate (TESSALON) 100  MG capsule, Take 1 capsule (100 mg total) by mouth 2 (two) times daily as needed for cough.   cetirizine (ZYRTEC) 10 MG tablet, Take 10 mg by mouth daily.   fluticasone (FLONASE) 50 MCG/ACT nasal spray, Place 2 sprays into both nostrils daily.  Current Outpatient Medications (Analgesics):    acetaminophen (TYLENOL) 500 MG tablet, Take 2 tablets (1,000 mg total) by mouth every 6 (six) hours. (Patient taking differently: Take 1,000 mg by mouth as needed.)   naproxen (NAPROSYN) 500 MG tablet, TAKE ONE TABLET BY MOUTH TWICE A DAY AS NEEDED FOR MODERATE PAIN  Current Outpatient Medications (Hematological):    Ferrous Sulfate (IRON PO), Take by  mouth daily.  Current Outpatient Medications (Other):    gabapentin (NEURONTIN) 100 MG capsule, Take 2 capsules (200 mg total) by mouth at bedtime.   tiZANidine (ZANAFLEX) 2 MG tablet, Take 1 tablet (2 mg total) by mouth 3 (three) times daily as needed for muscle spasms.   blood glucose meter kit and supplies KIT, Use as directed twice a day   Blood Glucose Monitoring Suppl (CONTOUR NEXT ONE) KIT, Use as directed   busPIRone (BUSPAR) 7.5 MG tablet, Take 1 tablet (7.5 mg total) by mouth 3 (three) times daily.   desvenlafaxine (PRISTIQ) 50 MG 24 hr tablet, Take 1 tablet (50 mg total) by mouth daily.   famotidine (PEPCID) 20 MG tablet, TAKE ONE TABLET BY MOUTH EVERY MORNING AND TAKE ONE TABLET BY MOUTH EVERY NIGHT AT BEDTIME   glucose blood (CONTOUR NEXT TEST) test strip, Use as instructed   hydrOXYzine (ATARAX) 25 MG tablet, Take 1 tablet (25 mg total) by mouth 3 (three) times daily as needed for anxiety.   metoCLOPramide (REGLAN) 10 MG tablet, Take 1 tablet (10 mg total) by mouth 3 (three) times daily before meals.   omeprazole (PRILOSEC) 40 MG capsule, Take 1 capsule (40 mg total) by mouth daily.   ondansetron (ZOFRAN) 4 MG tablet, Take 1 tablet (4 mg total) by mouth every 8 (eight) hours as needed for nausea or vomiting.   Vitamin D, Ergocalciferol,  (DRISDOL) 1.25 MG (50000 UNIT) CAPS capsule, TAKE ONE CAPSULE BY MOUTH ONCE WEEKLY   Reviewed prior external information including notes and imaging from  primary care provider As well as notes that were available from care everywhere and other healthcare systems.  Past medical history, social, surgical and family history all reviewed in electronic medical record.  No pertanent information unless stated regarding to the chief complaint.   Review of Systems:  No headache, visual changes, nausea, vomiting, diarrhea, constipation, dizziness, abdominal pain, skin rash, fevers, chills, night sweats, weight loss, swollen lymph nodes, body aches, joint swelling, chest pain, shortness of breath, mood changes. POSITIVE muscle aches  Objective  Blood pressure 110/88, pulse 100, height _0  (1.676 m), weight 240 lb (108.9 kg), SpO2 95 %, unknown if currently breastfeeding.   General: No apparent distress alert and oriented x3 mood and affect normal, dressed appropriately.  Overweight HEENT: Pupils equal, extraocular movements intact  Respiratory: Patient's speak in full sentences and does not appear short of breath  Cardiovascular: No lower extremity edema, non tender, no erythema  Gait normal with good balance and coordination.  MSK: Neck exam shows the patient has normal left rotation as well as right sidebending baseline. Patient has only 5 degrees after significant amount of active pushing to rotation to the right of midline.  Patient does have weakness noted in the C7 and C8 distribution on the left side.  Due to the tightness in the neck unable to do a Spurling's test.   [id number]; 15 additional minutes spent for Therapeutic exercises as stated in above notes.  This included exercises focusing on stretching, strengthening, with significant focus on eccentric aspects.   Long term goals include an improvement in range of motion, strength, endurance as well as avoiding reinjury. Patient's frequency would  include in 1-2 times a day, 3-5 times a week for a duration of 6-12 weeks. Exercises that included:  Basic scapular stabilization to include adduction and depression of scapula Scaption, focusing on proper movement and good control Internal and External rotation utilizing a theraband, with elbow tucked at side  entire time Rows with theraband    Proper technique shown and discussed handout in great detail with ATC.  All questions were discussed and answered.   Impression and Recommendations:     The above documentation has been reviewed and is accurate and complete Lyndal Pulley, DO

## 2021-06-19 ENCOUNTER — Ambulatory Visit (INDEPENDENT_AMBULATORY_CARE_PROVIDER_SITE_OTHER): Payer: BC Managed Care – PPO | Admitting: Family Medicine

## 2021-06-19 VITALS — BP 110/88 | HR 100 | Ht 66.0 in | Wt 240.0 lb

## 2021-06-19 DIAGNOSIS — M542 Cervicalgia: Secondary | ICD-10-CM

## 2021-06-19 DIAGNOSIS — G8929 Other chronic pain: Secondary | ICD-10-CM

## 2021-06-19 DIAGNOSIS — M436 Torticollis: Secondary | ICD-10-CM

## 2021-06-19 MED ORDER — TIZANIDINE HCL 2 MG PO TABS: 2.0000 mg | ORAL_TABLET | Freq: Three times a day (TID) | ORAL | 0 refills | Status: DC | PRN

## 2021-06-19 MED ORDER — GABAPENTIN 100 MG PO CAPS: 200.0000 mg | ORAL_CAPSULE | Freq: Every day | ORAL | 0 refills | Status: DC

## 2021-06-19 NOTE — Patient Instructions (Addendum)
Do prescribed exercises at least 3x a week Gabapentin 200mg  at night Zanaflex 2mg  3x a day prn Stop baclofen  [facility] [phone] Call to schedule epidural  See you again in 6-7 weeks

## 2021-06-19 NOTE — Assessment & Plan Note (Addendum)
Patient has had torticollis for years.  Patient has difficulty with right-sided rotation and left-sided sidebending.  Patient has not undergone Botox therapy and will consider it.  Patient has had physical therapy on different occasions.  Given different exercises for more of the scapula that I think can be more beneficial.  Started on gabapentin as well as patient does not feel like she has made significant benefit with baclofen.  Was changed to a 10 and see how patient responds.  Patient will follow-up with me again 5 to 6 weeks otherwise.  Patient also will have a epidural secondary to the nerve root impingement noted on the MRI.  See how patient responds.

## 2021-07-02 ENCOUNTER — Ambulatory Visit
Admission: RE | Admit: 2021-07-02 | Discharge: 2021-07-02 | Disposition: A | Discharge status: Home / Self Care | Payer: BC Managed Care – PPO | Source: Ambulatory Visit | Attending: Family Medicine | Admitting: Family Medicine

## 2021-07-02 DIAGNOSIS — G8929 Other chronic pain: Secondary | ICD-10-CM

## 2021-07-02 MED ORDER — TRIAMCINOLONE ACETONIDE 40 MG/ML IJ SUSP (RADIOLOGY)
60.0000 mg | Freq: Once | INTRAMUSCULAR | Status: AC
  Administered 2021-07-02: 60 mg via EPIDURAL

## 2021-07-02 MED ORDER — IOPAMIDOL (ISOVUE-M 300) INJECTION 61%
1.0000 mL | Freq: Once | INTRAMUSCULAR | Status: AC | PRN
  Administered 2021-07-02: 1 mL via EPIDURAL

## 2021-07-02 NOTE — Discharge Instructions (Signed)

## 2021-07-09 ENCOUNTER — Other Ambulatory Visit: Payer: Self-pay | Admitting: *Deleted

## 2021-07-10 MED ORDER — FAMOTIDINE 20 MG PO TABS
ORAL_TABLET | ORAL | 2 refills | Status: DC
Start: 2021-07-10 — End: 2022-07-15

## 2021-07-19 ENCOUNTER — Ambulatory Visit: Payer: BC Managed Care – PPO | Admitting: Adult Health

## 2021-07-25 ENCOUNTER — Other Ambulatory Visit: Payer: Self-pay

## 2021-07-25 ENCOUNTER — Encounter: Payer: Self-pay | Admitting: Cardiology

## 2021-07-25 DIAGNOSIS — Z7689 Persons encountering health services in other specified circumstances: Secondary | ICD-10-CM

## 2021-07-25 MED ORDER — BLOOD PRESSURE CUFF MISC: 1.0000 [IU] | Freq: Once | 0 refills | Status: AC

## 2021-07-29 ENCOUNTER — Other Ambulatory Visit: Payer: Self-pay

## 2021-07-29 ENCOUNTER — Telehealth: Payer: Self-pay | Admitting: Cardiology

## 2021-07-29 MED ORDER — BLOOD PRESSURE CUFF MISC: 1.0000 [IU] | Freq: Once | 0 refills | Status: AC

## 2021-07-29 NOTE — Telephone Encounter (Signed)
Pt called back again stating that no one has been able to assist her with her prescription. She states that she was told turned around because Dr. Servando Salina is not in the office today. Please advise.

## 2021-07-29 NOTE — Telephone Encounter (Signed)
Called pt to let her know the prescription she needed is at the front desk at the [place] office.No answer at this time.Left a detailed message for her.

## 2021-07-29 NOTE — Telephone Encounter (Signed)
Patient went to the [facility] today to pick up a paper Rx Dr. Servando Salina was supposed to leave for her, but it was not there. She is not sure what to do. Please advise

## 2021-08-06 NOTE — Progress Notes (Deleted)
Katie [name] Phone: [phone] Subjective:    I'm seeing this patient by the request  of:  Caren Macadam, MD (Inactive)  CC: neck pain   GBE:[id number]  06/19/2021 Patient has had torticollis for years.  Patient has difficulty with right-sided rotation and left-sided sidebending.  Patient has not undergone Botox therapy and will consider it.  Patient has had physical therapy on different occasions.  Given different exercises for more of the scapula that I think can be more beneficial.  Started on gabapentin as well as patient does not feel like she has made significant benefit with baclofen.  Was changed to a 10 and see how patient responds.  Patient will follow-up with me again 5 to 6 weeks otherwise.  Patient also will have a epidural secondary to the nerve root impingement noted on the MRI.  See how patient responds.  Updated 08/07/2021 Katie [name] is a 36 y.o. female coming in with complaint of torticollis   Patient did undergo an epidural of the cervical spine July 02, 2021    Past Medical History:  Diagnosis Date   ADHD (attention deficit hyperactivity disorder) 03/01/2011   Anxiety    Arthritis    neck    Asthma    as a child   Borderline personality disorder ([place]) 05/26/2019   Bronchospasm 01/19/2014   Formatting of this note might be different from the original. Prn albuterol, WARI   Chronic hypertension in pregnancy 06/05/2020   Chronic neck pain 04/04/2019   Complex atypical endometrial hyperplasia 02/24/2019   Depression    Fibromyalgia    Hepatic steatosis    Hypertension    Hypertension during pregnancy in third trimester 04/02/2020   IBS (irritable bowel syndrome)    Newly diagnosed diabetes ([place]) 03/09/2019   Obesity    Panic attack    Torticollis 01/19/2014   Valium overdose    Past Surgical History:  Procedure Laterality Date   big toes ingrown toenail removed     CESAREAN  SECTION N/A 05/30/2020   Procedure: CESAREAN SECTION;  Surgeon: Azucena Fallen, MD;  Location: [facility];  Service: Obstetrics;  Laterality: N/A;   DILATATION & CURETTAGE/HYSTEROSCOPY WITH MYOSURE N/A 02/24/2019   Procedure: DILATATION & CURETTAGE/HYSTEROSCOPY WITH MYOSURE;  Surgeon: Azucena Fallen, MD;  Location: [facility];  Service: Gynecology;  Laterality: N/A;   MOUTH SURGERY     "baby teeth removed,adult teeth  pulled down"   RADIOLOGY WITH ANESTHESIA N/A 06/07/2019   Procedure: MRI CERVICAL SPINE WITHOUT CONTRAST;  Surgeon: Radiologist, Medication, MD;  Location: [facility];  Service: Radiology;  Laterality: N/A;   wisdom teeth     Social History   Socioeconomic History   Marital status: Single    Spouse name: Not on file   Number of children: Not on file   Years of education: Not on file   Highest education level: Some college, no degree  Occupational History   Not on file  Tobacco Use   Smoking status: Former    Packs/day: 0.50    Years: 4.00    Total pack years: 2.00    Types: Cigarettes    Quit date: 01/13/2005    Years since quitting: 16.5   Smokeless tobacco: Never  Vaping Use   Vaping Use: Never used  Substance and Sexual Activity   Alcohol use: Not Currently   Drug use: No   Sexual activity: Not Currently  Other Topics Concern  Not on file  Social History Narrative   Not on file   Social Determinants of Health   Financial Resource Strain: Medium Risk (05/08/2021)   Overall Financial Resource Strain (CARDIA)    Difficulty of Paying Living Expenses: Somewhat hard  Food Insecurity: Food Insecurity Present (05/08/2021)   Hunger Vital Sign    Worried About Running Out of Food in the Last Year: Sometimes true    Ran Out of Food in the Last Year: Sometimes true  Transportation Needs: No Transportation Needs (05/08/2021)   PRAPARE - Hydrologist (Medical): No    Lack of Transportation (Non-Medical): No  Physical Activity:  Sufficiently Active (05/08/2021)   Exercise Vital Sign    Days of Exercise per Week: 4 days    Minutes of Exercise per Session: 40 min  Stress: Stress Concern Present (05/08/2021)   [place]    Feeling of Stress : Very much  Social Connections: Socially Isolated (05/08/2021)   Social Connection and Isolation Panel [NHANES]    Frequency of Communication with Friends and Family: More than three times a week    Frequency of Social Gatherings with Friends and Family: Three times a week    Attends Religious Services: Never    Active Member of Clubs or Organizations: No    Attends Music therapist: Not on file    Marital Status: Never married   Allergies  Allergen Reactions   Flexeril [Cyclobenzaprine Hcl] Shortness Of Breath   Lorazepam     Other reaction(s): Other Neck spasm   Family History  Problem Relation Age of Onset   Heart disease Mother    Depression Mother    High blood pressure Mother    Kidney disease Mother    Psychosis Mother    Hearing loss Father    Depression Father    Anxiety disorder Father    Asthma Daughter    Learning disabilities Daughter    Heart disease Maternal Grandfather    Other Brother        fatty liver   Multiple sclerosis Maternal Grandmother    COPD Paternal Grandmother    Heart failure Paternal Grandmother    Heart attack Paternal Grandmother    Stroke Paternal Grandmother    Lung cancer Paternal Grandfather     Current Outpatient Medications (Endocrine & Metabolic):    metFORMIN (GLUCOPHAGE XR) 750 MG 24 hr tablet, Take 1 tablet (750 mg total) by mouth daily with breakfast.   Semaglutide,0.25 or 0.5MG /DOS, (OZEMPIC, 0.25 OR 0.5 MG/DOSE,) 2 MG/1.5ML SOPN, Inject 0.5 mg into the skin once a week.  Current Outpatient Medications (Cardiovascular):    furosemide (LASIX) 40 MG tablet, TAKE ONE TABLET BY MOUTH DAILY FOR 7 DAYS THEN USE EVERY OTHER DAY    hydrochlorothiazide (HYDRODIURIL) 25 MG tablet, Take 1 tablet (25 mg total) by mouth daily.   propranolol (INDERAL) 10 MG tablet, Take 1 tablet (10 mg total) by mouth daily for 1 dose.   valsartan (DIOVAN) 80 MG tablet, Take 1 tablet (80 mg total) by mouth daily.  Current Outpatient Medications (Respiratory):    albuterol (VENTOLIN HFA) 108 (90 Base) MCG/ACT inhaler, Inhale 2 puffs into the lungs every 6 (six) hours as needed for wheezing or shortness of breath.   benzonatate (TESSALON) 100 MG capsule, Take 1 capsule (100 mg total) by mouth 2 (two) times daily as needed for cough.   cetirizine (ZYRTEC) 10 MG tablet, Take 10  mg by mouth daily.   fluticasone (FLONASE) 50 MCG/ACT nasal spray, Place 2 sprays into both nostrils daily.  Current Outpatient Medications (Analgesics):    acetaminophen (TYLENOL) 500 MG tablet, Take 2 tablets (1,000 mg total) by mouth every 6 (six) hours. (Patient taking differently: Take 1,000 mg by mouth as needed.)   naproxen (NAPROSYN) 500 MG tablet, TAKE ONE TABLET BY MOUTH TWICE A DAY AS NEEDED FOR MODERATE PAIN  Current Outpatient Medications (Hematological):    Ferrous Sulfate (IRON PO), Take by mouth daily.  Current Outpatient Medications (Other):    blood glucose meter kit and supplies KIT, Use as directed twice a day   Blood Glucose Monitoring Suppl (CONTOUR NEXT ONE) KIT, Use as directed   busPIRone (BUSPAR) 7.5 MG tablet, Take 1 tablet (7.5 mg total) by mouth 3 (three) times daily.   desvenlafaxine (PRISTIQ) 50 MG 24 hr tablet, Take 1 tablet (50 mg total) by mouth daily.   famotidine (PEPCID) 20 MG tablet, TAKE ONE TABLET BY MOUTH EVERY MORNING AND TAKE ONE TABLET BY MOUTH EVERY NIGHT AT BEDTIME   gabapentin (NEURONTIN) 100 MG capsule, Take 2 capsules (200 mg total) by mouth at bedtime.   glucose blood (CONTOUR NEXT TEST) test strip, Use as instructed   hydrOXYzine (ATARAX) 25 MG tablet, Take 1 tablet (25 mg total) by mouth 3 (three) times daily as needed  for anxiety.   metoCLOPramide (REGLAN) 10 MG tablet, Take 1 tablet (10 mg total) by mouth 3 (three) times daily before meals.   omeprazole (PRILOSEC) 40 MG capsule, Take 1 capsule (40 mg total) by mouth daily.   ondansetron (ZOFRAN) 4 MG tablet, Take 1 tablet (4 mg total) by mouth every 8 (eight) hours as needed for nausea or vomiting.   tiZANidine (ZANAFLEX) 2 MG tablet, Take 1 tablet (2 mg total) by mouth 3 (three) times daily as needed for muscle spasms.   Vitamin D, Ergocalciferol, (DRISDOL) 1.25 MG (50000 UNIT) CAPS capsule, TAKE ONE CAPSULE BY MOUTH ONCE WEEKLY   Reviewed prior external information including notes and imaging from  primary care provider As well as notes that were available from care everywhere and other healthcare systems.  Past medical history, social, surgical and family history all reviewed in electronic medical record.  No pertanent information unless stated regarding to the chief complaint.   Review of Systems:  No headache, visual changes, nausea, vomiting, diarrhea, constipation, dizziness, abdominal pain, skin rash, fevers, chills, night sweats, weight loss, swollen lymph nodes, body aches, joint swelling, chest pain, shortness of breath, mood changes. POSITIVE muscle aches  Objective  unknown if currently breastfeeding.   General: No apparent distress alert and oriented x3 mood and affect normal, dressed appropriately.  HEENT: Pupils equal, extraocular movements intact  Respiratory: Patient's speak in full sentences and does not appear short of breath  Cardiovascular: No lower extremity edema, non tender, no erythema      Impression and Recommendations:    The above documentation has been reviewed and is accurate and complete Lyndal Pulley, DO

## 2021-08-07 ENCOUNTER — Ambulatory Visit: Payer: BC Managed Care – PPO | Admitting: Family Medicine

## 2021-09-15 ENCOUNTER — Other Ambulatory Visit: Payer: Self-pay | Admitting: Cardiology

## 2021-09-29 ENCOUNTER — Other Ambulatory Visit: Payer: Self-pay | Admitting: Family Medicine

## 2021-10-10 ENCOUNTER — Telehealth: Payer: Self-pay | Admitting: Family Medicine

## 2021-10-10 NOTE — Telephone Encounter (Signed)
Noted  

## 2021-10-10 NOTE — Telephone Encounter (Signed)
Patient would like to do TOC to [facility] from [facility]. Okay to schedule?      Please advise

## 2021-10-10 NOTE — Telephone Encounter (Signed)
Please advise 

## 2021-10-11 NOTE — Telephone Encounter (Signed)
Left message to return phone call.

## 2021-10-21 ENCOUNTER — Telehealth: Payer: BC Managed Care – PPO | Admitting: Physician Assistant

## 2021-10-21 DIAGNOSIS — J302 Other seasonal allergic rhinitis: Secondary | ICD-10-CM

## 2021-10-21 MED ORDER — MONTELUKAST SODIUM 10 MG PO TABS: 10.0000 mg | ORAL_TABLET | Freq: Every day | ORAL | 3 refills | Status: DC

## 2021-10-21 MED ORDER — FLUTICASONE PROPIONATE 50 MCG/ACT NA SUSP: 2.0000 | Freq: Every day | NASAL | 0 refills | Status: DC

## 2021-10-21 MED ORDER — AZELASTINE HCL 0.05 % OP SOLN: 1.0000 [drp] | Freq: Two times a day (BID) | OPHTHALMIC | 1 refills | Status: DC

## 2021-10-21 NOTE — Progress Notes (Signed)
I have spent 5 minutes in review of e-visit questionnaire, review and updating patient chart, medical decision making and response to patient.   Shivaay Stormont Cody Azreal Stthomas, PA-C    

## 2021-10-21 NOTE — Telephone Encounter (Signed)
Pt is aware of the below message 

## 2021-10-21 NOTE — Progress Notes (Signed)
E visit for Allergic Rhinitis We are sorry that you are not feeling well.  Here is how we plan to help!  Based on what you have shared with me it looks like you have Allergic Rhinitis.  Rhinitis is when a reaction occurs that causes nasal congestion, runny nose, sneezing, and itching.  Most types of rhinitis are caused by an inflammation and are associated with symptoms in the eyes ears or throat. There are several types of rhinitis.  The most common are acute rhinitis, which is usually caused by a viral illness, allergic or seasonal rhinitis, and nonallergic or year-round rhinitis.  Nasal allergies occur certain times of the year.  Allergic rhinitis is caused when allergens in the air trigger the release of histamine in the body.  Histamine causes itching, swelling, and fluid to build up in the fragile linings of the nasal passages, sinuses and eyelids.  An itchy nose and clear discharge are common.  I recommend the following over the counter treatments: Allegra 60 mg twice daily  I also would recommend a nasal spray: Flonase 2 sprays into each nostril once daily - I have sent this in as a prescription.  You may also benefit from eye drops such as: Optivar eye drops applied twice daily -- This is a prescription and has been sent in.  Start all of the above and monitor over next few days. If not substantially improving, you can start Singulair which I have placed on file at the pharmacy and take once daily in addition to the above recommendations.   HOME CARE:  You can use an over-the-counter saline nasal spray as needed Avoid areas where there is heavy dust, mites, or molds Stay indoors on windy days during the pollen season Keep windows closed in home, at least in bedroom; use air conditioner. Use high-efficiency house air filter Keep windows closed in car, turn AC on re-circulate Avoid playing out with dog during pollen season  GET HELP RIGHT AWAY IF:  If your symptoms do not improve  within 10 days You become short of breath You develop yellow or green discharge from your nose for over 3 days You have coughing fits  MAKE SURE YOU:  Understand these instructions Will watch your condition Will get help right away if you are not doing well or get worse  Thank you for choosing an e-visit. Your e-visit answers were reviewed by a board certified advanced clinical practitioner to complete your personal care plan. Depending upon the condition, your plan could have included both over the counter or prescription medications. Please review your pharmacy choice. Be sure that the pharmacy you have chosen is open so that you can pick up your prescription now.  If there is a problem you may message your provider in [place] to have the prescription routed to another pharmacy. Your safety is important to [place]. If you have drug allergies check your prescription carefully.  For the next 24 hours, you can use MyChart to ask questions about today's visit, request a non-urgent call back, or ask for a work or school excuse from your e-visit provider. You will get an email in the next two days asking about your experience. I hope that your e-visit has been valuable and will speed your recovery.

## 2021-11-05 ENCOUNTER — Other Ambulatory Visit: Payer: Self-pay | Admitting: *Deleted

## 2021-11-05 DIAGNOSIS — E119 Type 2 diabetes mellitus without complications: Secondary | ICD-10-CM

## 2021-11-05 MED ORDER — METFORMIN HCL ER 750 MG PO TB24: 750.0000 mg | ORAL_TABLET | Freq: Every day | ORAL | 0 refills | Status: DC

## 2021-11-05 MED ORDER — OMEPRAZOLE 40 MG PO CPDR: 40.0000 mg | DELAYED_RELEASE_CAPSULE | Freq: Every day | ORAL | 0 refills | Status: DC

## 2021-11-05 NOTE — Telephone Encounter (Signed)
Ok to refill but she needs an appt for follow up on her diabetes

## 2021-11-07 ENCOUNTER — Other Ambulatory Visit: Payer: Self-pay | Admitting: *Deleted

## 2021-11-07 NOTE — Telephone Encounter (Signed)
Left a detailed message at the patient's cell number to call back with further information as below.

## 2021-11-07 NOTE — Telephone Encounter (Signed)
Does she have a follow up appointment with me or someone else?

## 2021-11-15 ENCOUNTER — Telehealth: Payer: Self-pay | Admitting: *Deleted

## 2021-11-15 NOTE — Patient Outreach (Signed)
  Care Coordination   11/15/2021 Name: Katie [name] MRN: [id number] DOB: 1985/04/24   Care Coordination Outreach Attempts:  An unsuccessful telephone outreach was attempted today to offer the patient information about available care coordination services as a benefit of their health plan.   Follow Up Plan:  Additional outreach attempts will be made to offer the patient care coordination information and services.   Encounter Outcome:  No Answer  Care Coordination Interventions Activated:  No   Care Coordination Interventions:  No, not indicated     Raina Mina, RN Care Management Coordinator [place] [phone]

## 2021-12-04 ENCOUNTER — Other Ambulatory Visit: Payer: Self-pay | Admitting: Family Medicine

## 2021-12-04 DIAGNOSIS — E119 Type 2 diabetes mellitus without complications: Secondary | ICD-10-CM

## 2021-12-04 NOTE — Telephone Encounter (Signed)
Ok to refill but she needs an appt with me to establish

## 2021-12-28 ENCOUNTER — Other Ambulatory Visit: Payer: Self-pay | Admitting: Family Medicine

## 2022-01-12 ENCOUNTER — Other Ambulatory Visit: Payer: Self-pay | Admitting: Family Medicine

## 2022-01-12 DIAGNOSIS — E119 Type 2 diabetes mellitus without complications: Secondary | ICD-10-CM

## 2022-01-17 ENCOUNTER — Telehealth: Payer: BC Managed Care – PPO | Admitting: Nurse Practitioner

## 2022-01-17 DIAGNOSIS — R110 Nausea: Secondary | ICD-10-CM | POA: Diagnosis not present

## 2022-01-17 MED ORDER — ONDANSETRON HCL 4 MG PO TABS: 4.0000 mg | ORAL_TABLET | Freq: Three times a day (TID) | ORAL | 0 refills | Status: DC | PRN

## 2022-01-17 NOTE — Progress Notes (Signed)
E-Visit for Nausea and Vomiting   We are sorry that you are not feeling well. Here is how we plan to help!  Based on what you have shared with me it looks like you have a Virus that is irritating your GI tract.  Vomiting is the forceful emptying of a portion of the stomach's content through the mouth.  Although nausea and vomiting can make you feel miserable, it's important to remember that these are not diseases, but rather symptoms of an underlying illness.  When we treat short term symptoms, we always caution that any symptoms that persist should be fully evaluated in a medical office.  These symptoms may be from the flu, but given it has been over two days you are outside of the window to receive the anti-viral for flu. That medicine (Tamiflu) may also upset your stomach so we do not recommend that at this time.   I have prescribed a medication that will help alleviate your symptoms and allow you to stay hydrated:  Zofran 4 mg 1 tablet every 8 hours as needed for nausea and vomiting  HOME CARE: Drink clear liquids.  This is very important! Dehydration (the lack of fluid) can lead to a serious complication.  Start off with 1 tablespoon every 5 minutes for 8 hours. You may begin eating bland foods after 8 hours without vomiting.  Start with saltine crackers, white bread, rice, mashed potatoes, applesauce. After 48 hours on a bland diet, you may resume a normal diet. Try to go to sleep.  Sleep often empties the stomach and relieves the need to vomit.  GET HELP RIGHT AWAY IF:  Your symptoms do not improve or worsen within 2 days after treatment. You have a fever for over 3 days. You cannot keep down fluids after trying the medication.  MAKE SURE YOU:  Understand these instructions. Will watch your condition. Will get help right away if you are not doing well or get worse.    Thank you for choosing an e-visit.  Your e-visit answers were reviewed by a board certified advanced clinical  practitioner to complete your personal care plan. Depending upon the condition, your plan could have included both over the counter or prescription medications.  Please review your pharmacy choice. Make sure the pharmacy is open so you can pick up prescription now. If there is a problem, you may contact your provider through [employer] and have the prescription routed to another pharmacy.  Your safety is important to [place]. If you have drug allergies check your prescription carefully.   For the next 24 hours you can use MyChart to ask questions about today's visit, request a non-urgent call back, or ask for a work or school excuse. You will get an email in the next two days asking about your experience. I hope that your e-visit has been valuable and will speed your recovery.   Meds ordered this encounter  Medications   ondansetron (ZOFRAN) 4 MG tablet    Sig: Take 1 tablet (4 mg total) by mouth every 8 (eight) hours as needed for nausea or vomiting.    Dispense:  20 tablet    Refill:  0     I spent approximately 5 minutes reviewing the patient's history, current symptoms and coordinating their care today.

## 2022-03-05 ENCOUNTER — Encounter: Payer: Self-pay | Admitting: Family Medicine

## 2022-03-05 ENCOUNTER — Other Ambulatory Visit: Payer: Self-pay

## 2022-03-05 DIAGNOSIS — M5412 Radiculopathy, cervical region: Secondary | ICD-10-CM

## 2022-03-05 MED ORDER — GABAPENTIN 100 MG PO CAPS: 200.0000 mg | ORAL_CAPSULE | Freq: Every day | ORAL | 0 refills | Status: DC

## 2022-03-14 ENCOUNTER — Inpatient Hospital Stay
Admission: RE | Admit: 2022-03-14 | Discharge: 2022-03-14 | Disposition: A | Discharge status: Home / Self Care | Payer: BC Managed Care – PPO | Source: Ambulatory Visit | Attending: Family Medicine | Admitting: Family Medicine

## 2022-03-14 NOTE — Discharge Instructions (Signed)

## 2022-04-07 ENCOUNTER — Telehealth: Payer: BC Managed Care – PPO | Admitting: Nurse Practitioner

## 2022-04-07 DIAGNOSIS — J4521 Mild intermittent asthma with (acute) exacerbation: Secondary | ICD-10-CM

## 2022-04-07 DIAGNOSIS — J309 Allergic rhinitis, unspecified: Secondary | ICD-10-CM | POA: Diagnosis not present

## 2022-04-07 DIAGNOSIS — R051 Acute cough: Secondary | ICD-10-CM

## 2022-04-07 DIAGNOSIS — R110 Nausea: Secondary | ICD-10-CM | POA: Diagnosis not present

## 2022-04-07 MED ORDER — BENZONATATE 100 MG PO CAPS: 100.0000 mg | ORAL_CAPSULE | Freq: Three times a day (TID) | ORAL | 0 refills | Status: DC | PRN

## 2022-04-07 MED ORDER — FLUTICASONE PROPIONATE 50 MCG/ACT NA SUSP: 2.0000 | Freq: Every day | NASAL | 6 refills | Status: DC

## 2022-04-07 MED ORDER — ALBUTEROL SULFATE HFA 108 (90 BASE) MCG/ACT IN AERS: 2.0000 | INHALATION_SPRAY | Freq: Four times a day (QID) | RESPIRATORY_TRACT | 0 refills | Status: DC | PRN

## 2022-04-07 MED ORDER — ONDANSETRON HCL 4 MG PO TABS: 4.0000 mg | ORAL_TABLET | Freq: Three times a day (TID) | ORAL | 0 refills | Status: DC | PRN

## 2022-04-07 NOTE — Progress Notes (Signed)
E-Visit for Asthma  Based on what you have shared with me, it looks like you may have a flare up of your asthma.  Asthma is a chronic (ongoing) lung disease which results in airway obstruction, inflammation and hyper-responsiveness.   Asthma symptoms vary from person to person, with common symptoms including nighttime awakening and decreased ability to participate in normal activities as a result of shortness of breath. It is often triggered by changes in weather, changes in the season, changes in air temperature, or inside (home, school, daycare or work) allergens such as animal dander, mold, mildew, woodstoves or cockroaches.   It can also be triggered by hormonal changes, extreme emotion, physical exertion or an upper respiratory tract illness.     It is important to identify the trigger, and then eliminate or avoid the trigger if possible.   If you have been prescribed medications to be taken on a regular basis, it is important to follow the asthma action plan and to follow guidelines to adjust medication in response to increasing symptoms of decreased peak expiratory flow rate  Treatment: I have prescribed: Albuterol (Proventil HFA; Ventolin HFA) 108 (90 Base) MCG/ACT Inhaler 2 puffs into the lungs every six hours as needed for wheezing or shortness of breath Meds ordered this encounter  Medications   albuterol (VENTOLIN HFA) 108 (90 Base) MCG/ACT inhaler    Sig: Inhale 2 puffs into the lungs every 6 (six) hours as needed for wheezing or shortness of breath.    Dispense:  8 g    Refill:  0   fluticasone (FLONASE) 50 MCG/ACT nasal spray    Sig: Place 2 sprays into both nostrils daily.    Dispense:  16 g    Refill:  6   ondansetron (ZOFRAN) 4 MG tablet    Sig: Take 1 tablet (4 mg total) by mouth every 8 (eight) hours as needed for nausea or vomiting.    Dispense:  20 tablet    Refill:  0    benzonatate (TESSALON) 100 MG capsule    Sig: Take 1 capsule (100 mg total) by mouth 3 (three) times daily as needed.    Dispense:  30 capsule    Refill:  0    HOME CARE Only take medications as instructed by your medical team. Consider wearing a mask or scarf to improve breathing air temperature have been shown to decrease irritation and decrease exacerbations Get rest. Taking a steamy shower or using a humidifier may help nasal congestion sand ease sore throat pain. You can place a towel over your head and breathe in the steam from hot water coming from a faucet. Using a saline nasal spray works much the same way.  Cough drops, hare candies and sore throat lozenges may ease your cough.  Avoid close contacts especially the very you and the elderly Cover your mouth if you cough or sneeze Always remember to wash your hands.    GET HELP RIGHT AWAY IF: You develop worsening symptoms; breathlessness at rest, drowsy, confused or agitated, unable to speak in full sentences You have coughing fits You develop a severe headache or visual changes You develop shortness of breath, difficulty breathing or start having chest pain Your symptoms persist after you have completed your treatment plan If your symptoms do not improve within 10 days  MAKE SURE YOU Understand these instructions. Will watch your condition. Will get help right away if you are not doing well or get worse.   Your e-visit answers were  reviewed by a board certified advanced clinical practitioner to complete your personal care plan, Depending upon the condition, your plan could have included both over the counter or prescription medications.   Please review your pharmacy choice. Your safety is important to [place]. If you have drug allergies check your prescription carefully.  You can use MyChart to ask questions about today's visit, request a non-urgent  call back, or ask for a work or school excuse for 24 hours related to this  e-Visit. If it has been greater than 24 hours you will need to follow up with your provider, or enter a new e-Visit to address those concerns.   You will get an e-mail in the next two days asking about your experience. I hope that your e-visit has been valuable and will speed your recovery. Thank you for using e-visits.  I spent approximately 5 minutes reviewing the patient's history, current symptoms and coordinating their care today.

## 2022-04-29 ENCOUNTER — Telehealth: Payer: Self-pay | Admitting: Cardiology

## 2022-04-29 NOTE — Telephone Encounter (Signed)
Patient aware of provider recommendations 

## 2022-04-29 NOTE — Telephone Encounter (Signed)
Patient states she is currently in search of a new PCP and she would like to know if Dr. Servando Salina has recommendations. Please advise.

## 2022-05-23 ENCOUNTER — Ambulatory Visit: Payer: BC Managed Care – PPO | Admitting: Cardiology

## 2022-06-17 ENCOUNTER — Other Ambulatory Visit: Payer: Self-pay

## 2022-06-17 ENCOUNTER — Other Ambulatory Visit: Payer: Self-pay | Admitting: Family Medicine

## 2022-06-17 MED ORDER — GABAPENTIN 100 MG PO CAPS: 200.0000 mg | ORAL_CAPSULE | Freq: Every day | ORAL | 0 refills | Status: DC

## 2022-07-02 ENCOUNTER — Encounter: Payer: Self-pay | Admitting: Cardiology

## 2022-07-07 ENCOUNTER — Encounter: Payer: Self-pay | Admitting: Family Medicine

## 2022-07-07 ENCOUNTER — Other Ambulatory Visit: Payer: Self-pay

## 2022-07-07 DIAGNOSIS — R109 Unspecified abdominal pain: Secondary | ICD-10-CM

## 2022-07-07 DIAGNOSIS — Z7689 Persons encountering health services in other specified circumstances: Secondary | ICD-10-CM

## 2022-07-07 NOTE — Progress Notes (Signed)
Pt sent a MyChart message c/o stomach pain. States she does not have a PCP. Referral for PCP and GI placed.

## 2022-07-15 ENCOUNTER — Ambulatory Visit: Payer: BC Managed Care – PPO | Admitting: Family Medicine

## 2022-07-15 ENCOUNTER — Encounter: Payer: Self-pay | Admitting: Family Medicine

## 2022-07-15 VITALS — BP 118/70 | HR 95 | Temp 98.2°F | Ht 66.0 in | Wt 269.6 lb

## 2022-07-15 DIAGNOSIS — F32A Depression, unspecified: Secondary | ICD-10-CM | POA: Diagnosis not present

## 2022-07-15 DIAGNOSIS — E282 Polycystic ovarian syndrome: Secondary | ICD-10-CM | POA: Insufficient documentation

## 2022-07-15 DIAGNOSIS — E119 Type 2 diabetes mellitus without complications: Secondary | ICD-10-CM

## 2022-07-15 DIAGNOSIS — K219 Gastro-esophageal reflux disease without esophagitis: Secondary | ICD-10-CM

## 2022-07-15 DIAGNOSIS — Z7984 Long term (current) use of oral hypoglycemic drugs: Secondary | ICD-10-CM

## 2022-07-15 DIAGNOSIS — M797 Fibromyalgia: Secondary | ICD-10-CM

## 2022-07-15 DIAGNOSIS — I10 Essential (primary) hypertension: Secondary | ICD-10-CM

## 2022-07-15 LAB — POCT GLYCOSYLATED HEMOGLOBIN (HGB A1C): Hemoglobin A1C: 9.1 % — AB (ref 4.0–5.6)

## 2022-07-15 MED ORDER — METFORMIN HCL ER 750 MG PO TB24
750.0000 mg | ORAL_TABLET | Freq: Two times a day (BID) | ORAL | 1 refills | Status: DC
Start: 2022-07-15 — End: 2023-01-19

## 2022-07-15 MED ORDER — FAMOTIDINE 20 MG PO TABS
ORAL_TABLET | ORAL | 2 refills | Status: DC
Start: 2022-07-15 — End: 2022-11-11

## 2022-07-15 MED ORDER — TIRZEPATIDE 2.5 MG/0.5ML ~~LOC~~ SOAJ
2.5000 mg | SUBCUTANEOUS | 0 refills | Status: DC
Start: 2022-07-15 — End: 2022-09-01

## 2022-07-15 MED ORDER — TIRZEPATIDE 5 MG/0.5ML ~~LOC~~ SOAJ
5.0000 mg | SUBCUTANEOUS | 5 refills | Status: DC
Start: 2022-07-15 — End: 2022-12-22

## 2022-07-15 MED ORDER — HYDROXYZINE HCL 25 MG PO TABS
25.0000 mg | ORAL_TABLET | Freq: Three times a day (TID) | ORAL | 2 refills | Status: DC | PRN
Start: 2022-07-15 — End: 2023-10-23

## 2022-07-15 MED ORDER — OMEPRAZOLE 40 MG PO CPDR: 40.0000 mg | DELAYED_RELEASE_CAPSULE | Freq: Every day | ORAL | 0 refills | Status: DC

## 2022-07-15 MED ORDER — NAPROXEN 500 MG PO TABS: ORAL_TABLET | ORAL | 1 refills | Status: DC

## 2022-07-15 MED ORDER — CONTOUR NEXT TEST VI STRP
ORAL_STRIP | 3 refills | Status: DC
Start: 2022-07-15 — End: 2022-07-22

## 2022-07-15 NOTE — Assessment & Plan Note (Signed)
Current hypertension medications:       Sig   furosemide (LASIX) 40 MG tablet (Taking) TAKE ONE TABLET BY MOUTH DAILY FOR 7 DAYS THEN USE EVERY OTHER DAY   hydrochlorothiazide (HYDRODIURIL) 25 MG tablet (Taking) TAKE ONE TABLET BY MOUTH DAILY   valsartan (DIOVAN) 80 MG tablet (Taking) Take 1 tablet (80 mg total) by mouth daily.   propranolol (INDERAL) 10 MG tablet Take 1 tablet (10 mg total) by mouth daily for 1 dose.      Pt is seeing a cardiologist who is managing her blood pressure, she is well controlled today, will continue the above medications as prescribed

## 2022-07-15 NOTE — Progress Notes (Signed)
Established Patient Office Visit  Subjective   Patient ID: Katie [name], female    DOB: September 22, 1985  Age: 37 y.o. MRN: [id number]  Chief Complaint  Patient presents with   Establish Care   Abdominal Pain    Patient complains of recurrent abdominal pain, discontinued Metformin as she suspected this was the cause, states cardiologist has referred her to GI and she needs to call for an appointment   Fatigue    X2 weeks    Patient is here for continued abdominal pain. States that she thought it might be the metformin so she stopped that for a few days. States that it has been going on for about 2 weeks, states that she does have diarrhea/soft stools as well. Some mild nausea but vomiting. No fever/chills. Pt reports the pain kinda feels all over, not felt particularly gassy or bloated. Feels also like she has no energy, very fatigued. She is able to eat, food does not make it worse. She was placed on famotidine and prilosec by another provider, states that this is helping some but the symptoms are still present. Her cardiologist has already referred her to the GI doctor.   DM- A1C today is 9.1, much higher than previous value. She reports that she was compliant with the metformin up until a few days ago. I reviewed her past medications, she reports that she used to be on Ozempic but that it was hard to get and her insurance wouldn't pay for it. She reports no changes in her vision, no numbness or tingling in her feet or hands. Does report she often feels very tired, very low on energy. States her sleep is "ok". She reports that she was working hard to lose weight, lost about 70 pounds previously, then she became pregnant with her son and had him last year. She reports that she was told she had PCOS in the past, most of the time does not have periods regularly, is on the mini pill to induce periods if she misses them. States that she will be going back to her GYN for follow up soon.    Depression -- pt continues to see her psychiatrist for this problem, she reports that her symptoms are stable on the current medications.   Abdominal Pain This is a new problem. The current episode started 1 to 4 weeks ago. The onset quality is gradual.    Current Outpatient Medications  Medication Instructions   acetaminophen (TYLENOL) 1,000 mg, Oral, Every 6 hours   albuterol (VENTOLIN HFA) 108 (90 Base) MCG/ACT inhaler 2 puffs, Inhalation, Every 6 hours PRN   albuterol (VENTOLIN HFA) 108 (90 Base) MCG/ACT inhaler 2 puffs, Inhalation, Every 6 hours PRN   azelastine (OPTIVAR) 0.05 % ophthalmic solution 1 drop, Both Eyes, 2 times daily   blood glucose meter kit and supplies KIT Use as directed twice a day   Blood Glucose Monitoring Suppl (CONTOUR NEXT ONE) KIT Use as directed   busPIRone (BUSPAR) 7.5 mg, Oral, 3 times daily   cetirizine (ZYRTEC) 10 mg, Oral, Daily   desvenlafaxine (PRISTIQ) 50 mg, Oral, Daily   famotidine (PEPCID) 20 MG tablet TAKE ONE TABLET BY MOUTH EVERY MORNING AND TAKE ONE TABLET BY MOUTH EVERY NIGHT AT BEDTIME   Ferrous Sulfate (IRON PO) Oral, Daily   fluticasone (FLONASE) 50 MCG/ACT nasal spray 2 sprays, Each Nare, Daily   fluticasone (FLONASE) 50 MCG/ACT nasal spray 2 sprays, Each Nare, Daily   furosemide (LASIX) 40 MG tablet TAKE ONE  TABLET BY MOUTH DAILY FOR 7 DAYS THEN USE EVERY OTHER DAY   gabapentin (NEURONTIN) 200 mg, Oral, Daily at bedtime   glucose blood (CONTOUR NEXT TEST) test strip Use as instructed   hydrochlorothiazide (HYDRODIURIL) 25 mg, Oral, Daily   hydrOXYzine (ATARAX) 25 mg, Oral, 3 times daily PRN   metFORMIN (GLUCOPHAGE-XR) 750 mg, Oral, 2 times daily   metoCLOPramide (REGLAN) 10 mg, Oral, 3 times daily before meals   montelukast (SINGULAIR) 10 mg, Oral, Daily at bedtime   naproxen (NAPROSYN) 500 MG tablet TAKE ONE TABLET BY MOUTH TWICE A DAY AS NEEDED FOR MODERATE PAIN   omeprazole (PRILOSEC) 40 mg, Oral, Daily   ondansetron (ZOFRAN) 4  mg, Oral, Every 8 hours PRN   ondansetron (ZOFRAN) 4 mg, Oral, Every 8 hours PRN   ondansetron (ZOFRAN) 4 mg, Oral, Every 8 hours PRN   Ozempic (0.25 or 0.5 MG/DOSE) 0.5 mg, Subcutaneous, Weekly   propranolol (INDERAL) 10 mg, Oral, Daily   tirzepatide (MOUNJARO) 2.5 mg, Subcutaneous, Weekly   tirzepatide (MOUNJARO) 5 mg, Subcutaneous, Weekly   tiZANidine (ZANAFLEX) 2 mg, Oral, 3 times daily PRN   valsartan (DIOVAN) 80 mg, Oral, Daily   venlafaxine XR (EFFEXOR-XR) 150 mg, Oral, Daily with breakfast   Vitamin D, Ergocalciferol, (DRISDOL) 1.25 MG (50000 UNIT) CAPS capsule TAKE ONE CAPSULE BY MOUTH ONCE WEEKLY    Patient Active Problem List   Diagnosis Date Noted   PCOS (polycystic ovarian syndrome) 07/15/2022   Gastroesophageal reflux disease without esophagitis 07/15/2022   Valium overdose    Panic attack    Obesity    IBS (irritable bowel syndrome)    Hypertension    Hepatic steatosis    Fibromyalgia    Asthma    Arthritis    Anxiety    Acute blood loss anemia (ABLA) 05/31/2020   Status post primary low transverse cesarean section FTP 5/18 05/30/2020   Postpartum care following cesarean delivery 5/18 05/30/2020   Delivery by emergency cesarean 05/30/2020   Encounter for induction of labor 05/29/2020   Type 2 diabetes mellitus without complication, without long-term current use of insulin (HCC) 04/02/2020   Morbid obesity (HCC) 04/02/2020   Depression    Borderline personality disorder (HCC) 05/26/2019   Chronic neck pain 04/04/2019   Complex atypical endometrial hyperplasia 02/24/2019   Torticollis 01/19/2014   Bronchospasm 01/19/2014   ADHD (attention deficit hyperactivity disorder) 03/01/2011      Review of Systems  Gastrointestinal:  Positive for abdominal pain.      Objective:     BP 118/70 (BP Location: Left Arm, Patient Position: Sitting, Cuff Size: Large)   Pulse 95   Temp 98.2 F (36.8 C) (Oral)   Ht 5\' 6"  (1.676 m)   Wt 269 lb 9.6 oz (122.3 kg)   LMP  05/28/2022 (Exact Date)   SpO2 96%   BMI 43.51 kg/m    Physical Exam Vitals reviewed.  Constitutional:      Appearance: Normal appearance. She is well-groomed. She is morbidly obese.  Eyes:     Conjunctiva/sclera: Conjunctivae normal.  Neck:     Thyroid: No thyromegaly.  Cardiovascular:     Rate and Rhythm: Normal rate and regular rhythm.     Pulses: Normal pulses.     Heart sounds: S1 normal and S2 normal.  Pulmonary:     Effort: Pulmonary effort is normal.     Breath sounds: Normal breath sounds and air entry.  Abdominal:     General: Bowel sounds are normal.  Musculoskeletal:     Right lower leg: No edema.     Left lower leg: No edema.  Neurological:     Mental Status: She is alert and oriented to person, place, and time. Mental status is at baseline.     Gait: Gait is intact.  Psychiatric:        Mood and Affect: Mood and affect normal.        Speech: Speech normal.        Behavior: Behavior normal.        Judgment: Judgment normal.      Results for orders placed or performed in visit on 07/15/22  POC HgB A1c  Result Value Ref Range   Hemoglobin A1C 9.1 (A) 4.0 - 5.6 %   HbA1c POC (<> result, manual entry)     HbA1c, POC (prediabetic range)     HbA1c, POC (controlled diabetic range)        The ASCVD Risk score (Arnett DK, et al., 2019) failed to calculate for the following reasons:   The 2019 ASCVD risk score is only valid for ages 15 to 64    Assessment & Plan:  Type 2 diabetes mellitus without complication, without long-term current use of insulin (HCC) Assessment & Plan: A1C is very uncontrolled today, we had a long discussion about placing her back on the GLP medication and increasing her metformin, pt is agreeable to this plan. I will need to get all new labs including lipid panel, urine micro, CMP. I will see her back in 3 months to recheck her A1C.   Orders: -     POCT glycosylated hemoglobin (Hb A1C) -     Microalbumin / creatinine urine ratio;  Future -     Lipid panel; Future -     metFORMIN HCl ER; Take 1 tablet (750 mg total) by mouth in the morning and at bedtime.  Dispense: 180 tablet; Refill: 1 -     Tirzepatide; Inject 2.5 mg into the skin once a week.  Dispense: 2 mL; Refill: 0 -     Tirzepatide; Inject 5 mg into the skin once a week.  Dispense: 6 mL; Refill: 5 -     Contour Next Test; Use as instructed  Dispense: 100 each; Refill: 3  Primary hypertension Assessment & Plan: Current hypertension medications:       Sig   furosemide (LASIX) 40 MG tablet (Taking) TAKE ONE TABLET BY MOUTH DAILY FOR 7 DAYS THEN USE EVERY OTHER DAY   hydrochlorothiazide (HYDRODIURIL) 25 MG tablet (Taking) TAKE ONE TABLET BY MOUTH DAILY   valsartan (DIOVAN) 80 MG tablet (Taking) Take 1 tablet (80 mg total) by mouth daily.   propranolol (INDERAL) 10 MG tablet Take 1 tablet (10 mg total) by mouth daily for 1 dose.      Pt is seeing a cardiologist who is managing her blood pressure, she is well controlled today, will continue the above medications as prescribed   Orders: -     Comprehensive metabolic panel; Future -     TSH; Future  Gastroesophageal reflux disease without esophagitis Assessment & Plan: Could be causing the abdominal pain, however she would have improved on the PPI and H2 blockers, I will refill these medications for her which she will continue until she can see the GI doctor. It may be related to her blood sugars being uncontrolled -- question autonomic dysfunction.   Orders: -     Famotidine; TAKE ONE TABLET BY MOUTH EVERY MORNING AND  TAKE ONE TABLET BY MOUTH EVERY NIGHT AT BEDTIME  Dispense: 60 tablet; Refill: 2 -     Omeprazole; Take 1 capsule (40 mg total) by mouth daily.  Dispense: 90 capsule; Refill: 0  Depression, unspecified depression type -     hydrOXYzine HCl; Take 1 tablet (25 mg total) by mouth 3 (three) times daily as needed for anxiety.  Dispense: 30 tablet; Refill: 2  Fibromyalgia -     Naproxen; TAKE ONE  TABLET BY MOUTH TWICE A DAY AS NEEDED FOR MODERATE PAIN  Dispense: 60 tablet; Refill: 1  PCOS (polycystic ovarian syndrome)    I spent 38 minutes with the patient today reviewing her history, counseling on weight loss and discussing diabetic medications.   Return in about 3 months (around 10/15/2022) for DM.    Karie Georges, MD

## 2022-07-15 NOTE — Assessment & Plan Note (Signed)
A1C is very uncontrolled today, we had a long discussion about placing her back on the GLP medication and increasing her metformin, pt is agreeable to this plan. I will need to get all new labs including lipid panel, urine micro, CMP. I will see her back in 3 months to recheck her A1C.

## 2022-07-15 NOTE — Assessment & Plan Note (Signed)
Could be causing the abdominal pain, however she would have improved on the PPI and H2 blockers, I will refill these medications for her which she will continue until she can see the GI doctor. It may be related to her blood sugars being uncontrolled -- question autonomic dysfunction.

## 2022-07-18 LAB — HM DIABETES EYE EXAM

## 2022-07-21 ENCOUNTER — Encounter: Payer: Self-pay | Admitting: Family Medicine

## 2022-07-21 DIAGNOSIS — E119 Type 2 diabetes mellitus without complications: Secondary | ICD-10-CM

## 2022-07-22 MED ORDER — CONTOUR NEXT TEST VI STRP
ORAL_STRIP | 3 refills | Status: DC
Start: 2022-07-22 — End: 2022-12-22

## 2022-08-01 ENCOUNTER — Encounter: Payer: Self-pay | Admitting: Cardiology

## 2022-08-01 ENCOUNTER — Other Ambulatory Visit: Payer: Self-pay | Admitting: *Deleted

## 2022-08-01 ENCOUNTER — Ambulatory Visit: Payer: BC Managed Care – PPO | Admitting: Cardiology

## 2022-08-01 VITALS — BP 120/79 | HR 90 | Ht 66.0 in | Wt 269.8 lb

## 2022-08-01 DIAGNOSIS — E785 Hyperlipidemia, unspecified: Secondary | ICD-10-CM

## 2022-08-01 DIAGNOSIS — Z7984 Long term (current) use of oral hypoglycemic drugs: Secondary | ICD-10-CM | POA: Diagnosis not present

## 2022-08-01 DIAGNOSIS — E119 Type 2 diabetes mellitus without complications: Secondary | ICD-10-CM

## 2022-08-01 MED ORDER — VALSARTAN 80 MG PO TABS: 80.0000 mg | ORAL_TABLET | Freq: Every day | ORAL | 3 refills | Status: DC

## 2022-08-01 MED ORDER — FUROSEMIDE 40 MG PO TABS: ORAL_TABLET | ORAL | 3 refills | Status: DC

## 2022-08-01 MED ORDER — HYDROCHLOROTHIAZIDE 25 MG PO TABS: 25.0000 mg | ORAL_TABLET | Freq: Every day | ORAL | 3 refills | Status: DC

## 2022-08-01 MED ORDER — ATORVASTATIN CALCIUM 10 MG PO TABS: 10.0000 mg | ORAL_TABLET | Freq: Every day | ORAL | 6 refills | Status: DC

## 2022-08-01 MED ORDER — PROPRANOLOL HCL 10 MG PO TABS: 10.0000 mg | ORAL_TABLET | Freq: Every day | ORAL | 3 refills | Status: DC

## 2022-08-01 NOTE — Patient Instructions (Addendum)
Medication Instructions:   Medication refilled   Start  taking  Atorvastatin 10 mg  daily   *If you need a refill on your cardiac medications before your next appointment, please call your pharmacy*   Lab Work: week of 10/24/22  fasting  CMP LIPID HEPATIC PANEL  If you have labs (blood work) drawn today and your tests are completely normal, you will receive your results only by: MyChart Message (if you have MyChart) OR A paper copy in the mail If you have any lab test that is abnormal or we need to change your treatment, we will call you to review the results.   Testing/Procedures:  Not needed  Follow-Up: At [facility], you and your health needs are our priority.  As part of our continuing mission to provide you with exceptional heart care, we have created designated Provider Care Teams.  These Care Teams include your primary Cardiologist (physician) and Advanced Practice Providers (APPs -  Physician Assistants and Nurse Practitioners) who all work together to provide you with the care you need, when you need it.     Your next appointment:   6 month(s) [place] office  The format for your next appointment:   In Person  Provider:   Thomasene Ripple, DO   You have been referred to  [facility] Endocrinologist for diabetes - their office will contact you for an appointment

## 2022-08-05 NOTE — Progress Notes (Signed)
Cardio-Obstetrics Clinic  Follow Up Note   Date:  08/05/2022   ID:  Katie [name], Ross 13-Apr-1985, MRN [id number]  PCP:  Karie Georges, MD   [facility] Providers Cardiologist:  Thomasene Ripple, DO  Electrophysiologist:  None        Referring MD: No ref. provider found   Chief Complaint: " I am having chest pains"  History of Present Illness:    Katie [name] is a 37 y.o. female [G2P2002] who returns for follow up.  Her recent pregnancy complicated by chronic hypertension in pregnancy, paroxysmal supraventricular tachycardia which was suspected to be AVNRT versus atrial tachycardia,Type 2 diabetes mellitus, anxiety, obesity.   We initially saw the patient on April 02, 2020, in our cardio obstetrics clinic due to her cardiovascular risk factors. During her initial visit we ordered an echocardiogram to assess her LV and RV function and for any structural abnormalities.  We also placed a ZIO monitor on the patient- and these results were discussed with her.   She did undergo cesarean section and giving birth to her baby.  She was discharged to home on Jun 02, 2020.  Since her discharge she tells me that she has had significant leg edema. She denies chest pain and shortness of breath.  I saw the patient Jun 05, 2020 at that time she appeared to be volume overloaded.  I started her on Lasix to help with diuresis.   I saw the patient on 06/15/2020 at that time, I kept her on the labetalol and added HCTZ 12.5 mg daily. We referred the patient to behavioral health.   Her last visit with me was February 2023 at that time she was experiencing chest discomfort I sent the patient for coronary CTA.  She did get the testing done which did not show evidence of coronary artery disease.  She was also hypertensive and optimize her antihypertensive medication.    Today she tells me that she had been working multiple jobs and was unable to follow-up.  She is experiencing  intermittent palpitations occasionally.  She notes that is improving but concerning.  She has not passed out no lightheadedness no dizziness.  Prior CV Studies Reviewed: The following studies were reviewed today:  TTE 04/09/2020 IMPRESSIONS   1. Left ventricular ejection fraction, by estimation, is 60 to 65%. The left ventricle has normal function. Left ventricular endocardial border not optimally defined to evaluate regional wall motion. There is mild concentric left ventricular  hypertrophy. Left ventricular diastolic parameters are consistent with Grade I diastolic dysfunction (impaired relaxation).   2. Right ventricular systolic function is normal. The right ventricular  size is normal. There is normal pulmonary artery systolic pressure.   3. The mitral valve is normal in structure. No evidence of mitral valve  regurgitation. No evidence of mitral stenosis.   4. The aortic valve is tricuspid. Aortic valve regurgitation is not  visualized. No aortic stenosis is present.   5. The inferior vena cava is normal in size with greater than 50%  respiratory variability, suggesting right atrial pressure of 3 mmHg.   FINDINGS   Left Ventricle: Left ventricular ejection fraction, by estimation, is 60  to 65%. The left ventricle has normal function. Left ventricular  endocardial border not optimally defined to evaluate regional wall motion.  The left ventricular internal cavity  size was normal in size. There is mild concentric left ventricular  hypertrophy. Left ventricular diastolic parameters are consistent with  Grade I diastolic dysfunction (impaired relaxation).  Normal left  ventricular filling pressure.   Right Ventricle: The right ventricular size is normal. No increase in right ventricular wall thickness. Right ventricular systolic function is  normal. There is normal pulmonary artery systolic pressure. The tricuspid  regurgitant velocity is 2.42 m/s, and   with an assumed right atrial  pressure of 3 mmHg, the estimated right  ventricular systolic pressure is 26.4 mmHg.   Left Atrium: Left atrial size was normal in size.   Right Atrium: Right atrial size was normal in size.   Pericardium: There is no evidence of pericardial effusion.   Mitral Valve: The mitral valve is normal in structure. No evidence of mitral valve regurgitation. No evidence of mitral valve stenosis.   Tricuspid Valve: The tricuspid valve is normal in structure. Tricuspid valve regurgitation is trivial. No evidence of tricuspid stenosis.   Aortic Valve: The aortic valve is tricuspid. Aortic valve regurgitation is not visualized. No aortic stenosis is present. Aortic valve mean gradient measures 6.0 mmHg. Aortic valve peak gradient measures 11.3 mmHg. Aortic  valve area, by VTI measures 2.72  cm.   Pulmonic Valve: The pulmonic valve was normal in structure. Pulmonic valve regurgitation is not visualized. No evidence of pulmonic stenosis.   Aorta: The aortic root and ascending aorta are structurally normal, with  no evidence of dilitation and the aortic arch was not well visualized.   Venous: The pulmonary veins were not well visualized. The inferior vena cava is normal in size with greater than 50% respiratory variability, suggesting right atrial pressure of 3 mmHg.   IAS/Shunts: No atrial level shunt detected by color flow Doppler.   Zio monitor The patient wore the monitor for 77 hours starting April 02, 2020. Indication: Palpitations   The minimum heart rate was 64 bpm, maximum heart rate was 149 bpm, and average heart rate was 99 bpm. Predominant underlying rhythm was Sinus Rhythm.     Premature atrial complexes were rare less than 1%. Premature Ventricular complexes were rare less than 1%.   No ventricular tachycardia, no significant pauses, No AV block, no supraventricular tachycardia and no atrial fibrillation present.   6 patient triggered events noted are associated with sinus  tachycardia and premature ventricular complex.   Conclusion: Normal/unremarkable study with no evidence of significant arrhythmia  Past Medical History:  Diagnosis Date   ADHD (attention deficit hyperactivity disorder) 03/01/2011   Anxiety    Arthritis    neck    Asthma    as a child   Borderline personality disorder (HCC) 05/26/2019   Bronchospasm 01/19/2014   Formatting of this note might be different from the original. Prn albuterol, WARI   Chronic hypertension in pregnancy 06/05/2020   Chronic neck pain 04/04/2019   Complex atypical endometrial hyperplasia 02/24/2019   Depression    Fibromyalgia    Hepatic steatosis    Hypertension    Hypertension during pregnancy in third trimester 04/02/2020   IBS (irritable bowel syndrome)    Newly diagnosed diabetes (HCC) 03/09/2019   Obesity    Panic attack    Torticollis 01/19/2014   Valium overdose     Past Surgical History:  Procedure Laterality Date   big toes ingrown toenail removed     CESAREAN SECTION N/A 05/30/2020   Procedure: CESAREAN SECTION;  Surgeon: Shea Evans, MD;  Location: [facility];  Service: Obstetrics;  Laterality: N/A;   DILATATION & CURETTAGE/HYSTEROSCOPY WITH MYOSURE N/A 02/24/2019   Procedure: DILATATION & CURETTAGE/HYSTEROSCOPY WITH MYOSURE;  Surgeon: Shea Evans, MD;  Location: [facility];  Service: Gynecology;  Laterality: N/A;   MOUTH SURGERY     "baby teeth removed,adult teeth  pulled down"   RADIOLOGY WITH ANESTHESIA N/A 06/07/2019   Procedure: MRI CERVICAL SPINE WITHOUT CONTRAST;  Surgeon: Radiologist, Medication, MD;  Location: [facility] OR;  Service: Radiology;  Laterality: N/A;   wisdom teeth        OB History     Gravida  2   Para  2   Term  2   Preterm      AB      Living  2      SAB      IAB      Ectopic      Multiple      Live Births  2               Current Medications: Current Meds  Medication Sig   acetaminophen (TYLENOL) 500 MG tablet Take 2 tablets  (1,000 mg total) by mouth every 6 (six) hours. (Patient taking differently: Take 1,000 mg by mouth as needed.)   albuterol (VENTOLIN HFA) 108 (90 Base) MCG/ACT inhaler Inhale 2 puffs into the lungs every 6 (six) hours as needed for wheezing or shortness of breath.   albuterol (VENTOLIN HFA) 108 (90 Base) MCG/ACT inhaler Inhale 2 puffs into the lungs every 6 (six) hours as needed for wheezing or shortness of breath.   atorvastatin (LIPITOR) 10 MG tablet Take 1 tablet (10 mg total) by mouth daily.   azelastine (OPTIVAR) 0.05 % ophthalmic solution Place 1 drop into both eyes 2 (two) times daily.   blood glucose meter kit and supplies KIT Use as directed twice a day   Blood Glucose Monitoring Suppl (CONTOUR NEXT ONE) KIT Use as directed   busPIRone (BUSPAR) 7.5 MG tablet Take 1 tablet (7.5 mg total) by mouth 3 (three) times daily.   cetirizine (ZYRTEC) 10 MG tablet Take 10 mg by mouth daily.   desvenlafaxine (PRISTIQ) 50 MG 24 hr tablet Take 1 tablet (50 mg total) by mouth daily.   famotidine (PEPCID) 20 MG tablet TAKE ONE TABLET BY MOUTH EVERY MORNING AND TAKE ONE TABLET BY MOUTH EVERY NIGHT AT BEDTIME   fluticasone (FLONASE) 50 MCG/ACT nasal spray Place 2 sprays into both nostrils daily.   gabapentin (NEURONTIN) 100 MG capsule Take 2 capsules (200 mg total) by mouth at bedtime.   glucose blood (CONTOUR NEXT TEST) test strip Use as instructed once a day   hydrOXYzine (ATARAX) 25 MG tablet Take 1 tablet (25 mg total) by mouth 3 (three) times daily as needed for anxiety.   metFORMIN (GLUCOPHAGE-XR) 750 MG 24 hr tablet Take 1 tablet (750 mg total) by mouth in the morning and at bedtime.   montelukast (SINGULAIR) 10 MG tablet Take 1 tablet (10 mg total) by mouth at bedtime.   naproxen (NAPROSYN) 500 MG tablet TAKE ONE TABLET BY MOUTH TWICE A DAY AS NEEDED FOR MODERATE PAIN   omeprazole (PRILOSEC) 40 MG capsule Take 1 capsule (40 mg total) by mouth daily.   tirzepatide [facility]) 2.5 MG/0.5ML Pen Inject  2.5 mg into the skin once a week.   venlafaxine XR (EFFEXOR-XR) 150 MG 24 hr capsule Take 150 mg by mouth daily with breakfast.   [DISCONTINUED] furosemide (LASIX) 40 MG tablet TAKE ONE TABLET BY MOUTH DAILY FOR 7 DAYS THEN USE EVERY OTHER DAY   [DISCONTINUED] hydrochlorothiazide (HYDRODIURIL) 25 MG tablet TAKE ONE TABLET BY MOUTH DAILY   [DISCONTINUED] propranolol (  INDERAL) 10 MG tablet Take 1 tablet (10 mg total) by mouth daily for 1 dose.   [DISCONTINUED] valsartan (DIOVAN) 80 MG tablet Take 1 tablet (80 mg total) by mouth daily.     Allergies:   Flexeril [cyclobenzaprine hcl] and Lorazepam   Social History   Socioeconomic History   Marital status: Single    Spouse name: Not on file   Number of children: Not on file   Years of education: Not on file   Highest education level: Some college, no degree  Occupational History   Not on file  Tobacco Use   Smoking status: Former    Current packs/day: 0.00    Average packs/day: 0.5 packs/day for 4.0 years (2.0 ttl pk-yrs)    Types: Cigarettes    Start date: 01/13/2001    Quit date: 01/13/2005    Years since quitting: 17.5   Smokeless tobacco: Never  Vaping Use   Vaping status: Never Used  Substance and Sexual Activity   Alcohol use: Not Currently   Drug use: No   Sexual activity: Not Currently  Other Topics Concern   Not on file  Social History Narrative   Not on file   Social Determinants of Health   Financial Resource Strain: Medium Risk (05/08/2021)   Overall Financial Resource Strain (CARDIA)    Difficulty of Paying Living Expenses: Somewhat hard  Food Insecurity: Food Insecurity Present (05/08/2021)   Hunger Vital Sign    Worried About Running Out of Food in the Last Year: Sometimes true    Ran Out of Food in the Last Year: Sometimes true  Transportation Needs: No Transportation Needs (05/08/2021)   PRAPARE - Administrator, Civil Service (Medical): No    Lack of Transportation (Non-Medical): No  Physical  Activity: Sufficiently Active (05/08/2021)   Exercise Vital Sign    Days of Exercise per Week: 4 days    Minutes of Exercise per Session: 40 min  Stress: Stress Concern Present (05/08/2021)   [employer] of Occupational Health - Occupational Stress Questionnaire    Feeling of Stress : Very much  Social Connections: Socially Isolated (05/08/2021)   Social Connection and Isolation Panel [NHANES]    Frequency of Communication with Friends and Family: More than three times a week    Frequency of Social Gatherings with Friends and Family: Three times a week    Attends Religious Services: Never    Active Member of Clubs or Organizations: No    Attends Engineer, structural: Not on file    Marital Status: Never married      Family History  Problem Relation Age of Onset   Heart disease Mother    Depression Mother    High blood pressure Mother    Kidney disease Mother    Psychosis Mother    Hearing loss Father    Depression Father    Anxiety disorder Father    Asthma Daughter    Learning disabilities Daughter    Heart disease Maternal Grandfather    Other Brother        fatty liver   Multiple sclerosis Maternal Grandmother    COPD Paternal Grandmother    Heart failure Paternal Grandmother    Heart attack Paternal Grandmother    Stroke Paternal Grandmother    Lung cancer Paternal Grandfather       ROS:   Please see the history of present illness.     All other systems reviewed and are negative.   Labs/EKG  Reviewed:    EKG:   EKG is was not ordered today.    Recent Labs: No results found for requested labs within last 365 days.   Recent Lipid Panel Lab Results  Component Value Date/Time   CHOL 167 10/03/2020 03:17 PM   TRIG (H) 10/03/2020 03:17 PM    475.0 Triglyceride is over 400; calculations on Lipids are invalid.   HDL 32.30 (L) 10/03/2020 03:17 PM   CHOLHDL 5 10/03/2020 03:17 PM   LDLDIRECT 89.0 10/03/2020 03:17 PM    Physical Exam:    VS:  BP  120/79 (BP Location: Left Arm, Patient Position: Sitting, Cuff Size: Normal)   Pulse 90   Ht 5\' 6"  (1.676 m)   Wt 269 lb 12.8 oz (122.4 kg)   LMP 05/28/2022 (Exact Date)   SpO2 98%   BMI 43.55 kg/m     Wt Readings from Last 3 Encounters:  08/01/22 269 lb 12.8 oz (122.4 kg)  07/15/22 269 lb 9.6 oz (122.3 kg)  06/19/21 240 lb (108.9 kg)     GEN:  Well nourished, well developed in no acute distress HEENT: Normal NECK: No JVD; No carotid bruits LYMPHATICS: No lymphadenopathy CARDIAC: RRR, no murmurs, rubs, gallops RESPIRATORY:  Clear to auscultation without rales, wheezing or rhonchi  ABDOMEN: Soft, non-tender, non-distended MUSCULOSKELETAL:  No edema; No deformity  SKIN: Warm and dry NEUROLOGIC:  Alert and oriented x 3 PSYCHIATRIC:  Normal affect    Risk Assessment/Risk Calculators:                 ASSESSMENT & PLAN:    Chronic hypertension Palpitation  Her blood pressure is appropriate in the office today no changes will be made to her antihypertensive medication.  Will send refills.  She is not on any lipid-lowering medication and did have elevated LDL in the past.  We discussed she is agreeable for atorvastatin 10 mg today.  Will get lipid panel as well as CMP in the upcoming weeks.  Will refer the patient to endocrine she needs better control of her diabetes.  The patient understands the need to lose weight with diet and exercise. We have discussed specific strategies for this.   Patient Instructions  Medication Instructions:   Medication refilled   Start  taking  Atorvastatin 10 mg  daily   *If you need a refill on your cardiac medications before your next appointment, please call your pharmacy*   Lab Work: week of 10/24/22  fasting  CMP LIPID HEPATIC PANEL  If you have labs (blood work) drawn today and your tests are completely normal, you will receive your results only by: MyChart Message (if you have MyChart) OR A paper copy in the mail If you  have any lab test that is abnormal or we need to change your treatment, we will call you to review the results.   Testing/Procedures:  Not needed  Follow-Up: At [facility], you and your health needs are our priority.  As part of our continuing mission to provide you with exceptional heart care, we have created designated Provider Care Teams.  These Care Teams include your primary Cardiologist (physician) and Advanced Practice Providers (APPs -  Physician Assistants and Nurse Practitioners) who all work together to provide you with the care you need, when you need it.     Your next appointment:   6 month(s) [place] office  The format for your next appointment:   In Person  Provider:   Thomasene Ripple, DO   You  have been referred to  [facility] Endocrinologist for diabetes - their office will contact you for an appointment    Dispo:  Return in about 6 months (around 02/01/2023).   Medication Adjustments/Labs and Tests Ordered: Current medicines are reviewed at length with the patient today.  Concerns regarding medicines are outlined above.  Tests Ordered: Orders Placed This Encounter  Procedures   Magnesium   Lipid panel   Comprehensive metabolic panel   Ambulatory referral to Endocrinology   Medication Changes: Meds ordered this encounter  Medications   atorvastatin (LIPITOR) 10 MG tablet    Sig: Take 1 tablet (10 mg total) by mouth daily.    Dispense:  30 tablet    Refill:  6   furosemide (LASIX) 40 MG tablet    Sig: TAKE ONE TABLET BY MOUTH DAILY FOR 7 DAYS THEN USE EVERY OTHER DAY    Dispense:  90 tablet    Refill:  3   hydrochlorothiazide (HYDRODIURIL) 25 MG tablet    Sig: Take 1 tablet (25 mg total) by mouth daily.    Dispense:  90 tablet    Refill:  3   propranolol (INDERAL) 10 MG tablet    Sig: Take 1 tablet (10 mg total) by mouth daily.    Dispense:  90 tablet    Refill:  3   valsartan (DIOVAN) 80 MG tablet    Sig: Take 1 tablet (80 mg total) by mouth  daily.    Dispense:  90 tablet    Refill:  3

## 2022-08-06 ENCOUNTER — Encounter: Payer: Self-pay | Admitting: Family Medicine

## 2022-08-06 DIAGNOSIS — E119 Type 2 diabetes mellitus without complications: Secondary | ICD-10-CM

## 2022-08-06 DIAGNOSIS — I10 Essential (primary) hypertension: Secondary | ICD-10-CM

## 2022-08-07 ENCOUNTER — Ambulatory Visit: Payer: BC Managed Care – PPO | Admitting: Family Medicine

## 2022-08-11 ENCOUNTER — Other Ambulatory Visit: Payer: Self-pay | Admitting: Family Medicine

## 2022-08-12 NOTE — Telephone Encounter (Signed)
Ok to change orders to [facility]

## 2022-08-13 NOTE — Progress Notes (Deleted)
[name] [address] [place] [address] Phone: [phone] Subjective:    I'm seeing this patient by the request  of:  Karie Georges, MD  CC:   [id number]  06/19/2021 Patient has had torticollis for years.  Patient has difficulty with right-sided rotation and left-sided sidebending.  Patient has not undergone Botox therapy and will consider it.  Patient has had physical therapy on different occasions.  Given different exercises for more of the scapula that I think can be more beneficial.  Started on gabapentin as well as patient does not feel like she has made significant benefit with baclofen.  Was changed to a 10 and see how patient responds.  Patient will follow-up with me again 5 to 6 weeks otherwise.  Patient also will have a epidural secondary to the nerve root impingement noted on the MRI.  See how patient responds.     Updated 08/19/2022 Katie [name] is a 37 y.o. female coming in with complaint of pain. Wants gabapentin refill.      Past Medical History:  Diagnosis Date   ADHD (attention deficit hyperactivity disorder) 03/01/2011   Anxiety    Arthritis    neck    Asthma    as a child   Borderline personality disorder (HCC) 05/26/2019   Bronchospasm 01/19/2014   Formatting of this note might be different from the original. Prn albuterol, WARI   Chronic hypertension in pregnancy 06/05/2020   Chronic neck pain 04/04/2019   Complex atypical endometrial hyperplasia 02/24/2019   Depression    Fibromyalgia    Hepatic steatosis    Hypertension    Hypertension during pregnancy in third trimester 04/02/2020   IBS (irritable bowel syndrome)    Newly diagnosed diabetes (HCC) 03/09/2019   Obesity    Panic attack    Torticollis 01/19/2014   Valium overdose    Past Surgical History:  Procedure Laterality Date   big toes ingrown toenail removed     CESAREAN SECTION N/A 05/30/2020   Procedure: CESAREAN SECTION;  Surgeon: Shea Evans, MD;  Location: [facility];  Service: Obstetrics;  Laterality: N/A;   DILATATION & CURETTAGE/HYSTEROSCOPY WITH MYOSURE N/A 02/24/2019   Procedure: DILATATION & CURETTAGE/HYSTEROSCOPY WITH MYOSURE;  Surgeon: Shea Evans, MD;  [facility];  Service: Gynecology;  Laterality: N/A;   MOUTH SURGERY     "baby teeth removed,adult teeth  pulled down"   RADIOLOGY WITH ANESTHESIA N/A 06/07/2019   Procedure: MRI CERVICAL SPINE WITHOUT CONTRAST;  Surgeon: Radiologist, Medication, MD;  Location: [facility] OR;  Service: Radiology;  Laterality: N/A;   wisdom teeth     Social History   Socioeconomic History   Marital status: Single    Spouse name: Not on file   Number of children: Not on file   Years of education: Not on file   Highest education level: Some college, no degree  Occupational History   Not on file  Tobacco Use   Smoking status: Former    Current packs/day: 0.00    Average packs/day: 0.5 packs/day for 4.0 years (2.0 ttl pk-yrs)    Types: Cigarettes    Start date: 01/13/2001    Quit date: 01/13/2005    Years since quitting: 17.5   Smokeless tobacco: Never  Vaping Use   Vaping status: Never Used  Substance and Sexual Activity   Alcohol use: Not Currently   Drug use: No   Sexual activity: Not Currently  Other Topics Concern  Not on file  Social History Narrative   Not on file   Social Determinants of Health   Financial Resource Strain: Medium Risk (05/08/2021)   Overall Financial Resource Strain (CARDIA)    Difficulty of Paying Living Expenses: Somewhat hard  Food Insecurity: Food Insecurity Present (05/08/2021)   Hunger Vital Sign    Worried About Running Out of Food in the Last Year: Sometimes true    Ran Out of Food in the Last Year: Sometimes true  Transportation Needs: No Transportation Needs (05/08/2021)   PRAPARE - Administrator, Civil Service (Medical): No    Lack of Transportation (Non-Medical): No  Physical Activity: Sufficiently  Active (05/08/2021)   Exercise Vital Sign    Days of Exercise per Week: 4 days    Minutes of Exercise per Session: 40 min  Stress: Stress Concern Present (05/08/2021)   [employer] of Occupational Health - Occupational Stress Questionnaire    Feeling of Stress : Very much  Social Connections: Socially Isolated (05/08/2021)   Social Connection and Isolation Panel [NHANES]    Frequency of Communication with Friends and Family: More than three times a week    Frequency of Social Gatherings with Friends and Family: Three times a week    Attends Religious Services: Never    Active Member of Clubs or Organizations: No    Attends Engineer, structural: Not on file    Marital Status: Never married   Allergies  Allergen Reactions   Flexeril [Cyclobenzaprine Hcl] Shortness Of Breath   Lorazepam     Other reaction(s): Other Neck spasm   Family History  Problem Relation Age of Onset   Heart disease Mother    Depression Mother    High blood pressure Mother    Kidney disease Mother    Psychosis Mother    Hearing loss Father    Depression Father    Anxiety disorder Father    Asthma Daughter    Learning disabilities Daughter    Heart disease Maternal Grandfather    Other Brother        fatty liver   Multiple sclerosis Maternal Grandmother    COPD Paternal Grandmother    Heart failure Paternal Grandmother    Heart attack Paternal Grandmother    Stroke Paternal Grandmother    Lung cancer Paternal Grandfather     Current Outpatient Medications (Endocrine & Metabolic):    metFORMIN (GLUCOPHAGE-XR) 750 MG 24 hr tablet, Take 1 tablet (750 mg total) by mouth in the morning and at bedtime.   Semaglutide,0.25 or 0.5MG /DOS, (OZEMPIC, 0.25 OR 0.5 MG/DOSE,) 2 MG/1.5ML SOPN, Inject 0.5 mg into the skin once a week. (Patient not taking: Reported on 08/01/2022)   tirzepatide [facility]) 2.5 MG/0.5ML Pen, Inject 2.5 mg into the skin once a week.   tirzepatide [facility]) 5 MG/0.5ML Pen,  Inject 5 mg into the skin once a week. (Patient not taking: Reported on 08/01/2022)  Current Outpatient Medications (Cardiovascular):    atorvastatin (LIPITOR) 10 MG tablet, Take 1 tablet (10 mg total) by mouth daily.   furosemide (LASIX) 40 MG tablet, TAKE ONE TABLET BY MOUTH DAILY FOR 7 DAYS THEN USE EVERY OTHER DAY   hydrochlorothiazide (HYDRODIURIL) 25 MG tablet, Take 1 tablet (25 mg total) by mouth daily.   propranolol (INDERAL) 10 MG tablet, Take 1 tablet (10 mg total) by mouth daily.   valsartan (DIOVAN) 80 MG tablet, Take 1 tablet (80 mg total) by mouth daily.  Current Outpatient Medications (Respiratory):  albuterol (VENTOLIN HFA) 108 (90 Base) MCG/ACT inhaler, Inhale 2 puffs into the lungs every 6 (six) hours as needed for wheezing or shortness of breath.   albuterol (VENTOLIN HFA) 108 (90 Base) MCG/ACT inhaler, Inhale 2 puffs into the lungs every 6 (six) hours as needed for wheezing or shortness of breath.   cetirizine (ZYRTEC) 10 MG tablet, Take 10 mg by mouth daily.   fluticasone (FLONASE) 50 MCG/ACT nasal spray, Place 2 sprays into both nostrils daily.   fluticasone (FLONASE) 50 MCG/ACT nasal spray, Place 2 sprays into both nostrils daily. (Patient not taking: Reported on 08/01/2022)   montelukast (SINGULAIR) 10 MG tablet, Take 1 tablet (10 mg total) by mouth at bedtime.  Current Outpatient Medications (Analgesics):    acetaminophen (TYLENOL) 500 MG tablet, Take 2 tablets (1,000 mg total) by mouth every 6 (six) hours. (Patient taking differently: Take 1,000 mg by mouth as needed.)   naproxen (NAPROSYN) 500 MG tablet, TAKE ONE TABLET BY MOUTH TWICE A DAY AS NEEDED FOR MODERATE PAIN  Current Outpatient Medications (Hematological):    Ferrous Sulfate (IRON PO), Take by mouth daily. (Patient not taking: Reported on 08/01/2022)  Current Outpatient Medications (Other):    azelastine (OPTIVAR) 0.05 % ophthalmic solution, Place 1 drop into both eyes 2 (two) times daily.   blood glucose  meter kit and supplies KIT, Use as directed twice a day   Blood Glucose Monitoring Suppl (CONTOUR NEXT ONE) KIT, Use as directed   busPIRone (BUSPAR) 7.5 MG tablet, Take 1 tablet (7.5 mg total) by mouth 3 (three) times daily.   desvenlafaxine (PRISTIQ) 50 MG 24 hr tablet, Take 1 tablet (50 mg total) by mouth daily.   famotidine (PEPCID) 20 MG tablet, TAKE ONE TABLET BY MOUTH EVERY MORNING AND TAKE ONE TABLET BY MOUTH EVERY NIGHT AT BEDTIME   gabapentin (NEURONTIN) 100 MG capsule, Take 2 capsules (200 mg total) by mouth at bedtime.   glucose blood (CONTOUR NEXT TEST) test strip, Use as instructed once a day   hydrOXYzine (ATARAX) 25 MG tablet, Take 1 tablet (25 mg total) by mouth 3 (three) times daily as needed for anxiety.   metoCLOPramide (REGLAN) 10 MG tablet, Take 1 tablet (10 mg total) by mouth 3 (three) times daily before meals. (Patient not taking: Reported on 08/01/2022)   omeprazole (PRILOSEC) 40 MG capsule, Take 1 capsule (40 mg total) by mouth daily.   ondansetron (ZOFRAN) 4 MG tablet, Take 1 tablet (4 mg total) by mouth every 8 (eight) hours as needed for nausea or vomiting. (Patient not taking: Reported on 08/01/2022)   ondansetron (ZOFRAN) 4 MG tablet, Take 1 tablet (4 mg total) by mouth every 8 (eight) hours as needed for nausea or vomiting. (Patient not taking: Reported on 08/01/2022)   ondansetron (ZOFRAN) 4 MG tablet, Take 1 tablet (4 mg total) by mouth every 8 (eight) hours as needed for nausea or vomiting. (Patient not taking: Reported on 08/01/2022)   tiZANidine (ZANAFLEX) 2 MG tablet, Take 1 tablet (2 mg total) by mouth 3 (three) times daily as needed for muscle spasms. (Patient not taking: Reported on 08/01/2022)   venlafaxine XR (EFFEXOR-XR) 150 MG 24 hr capsule, Take 150 mg by mouth daily with breakfast.   Vitamin D, Ergocalciferol, (DRISDOL) 1.25 MG (50000 UNIT) CAPS capsule, TAKE ONE CAPSULE BY MOUTH ONCE WEEKLY (Patient not taking: Reported on 08/01/2022)   Reviewed prior  external information including notes and imaging from  primary care provider As well as notes that were available from care  everywhere and other healthcare systems.  Past medical history, social, surgical and family history all reviewed in electronic medical record.  No pertanent information unless stated regarding to the chief complaint.   Review of Systems:  No headache, visual changes, nausea, vomiting, diarrhea, constipation, dizziness, abdominal pain, skin rash, fevers, chills, night sweats, weight loss, swollen lymph nodes, body aches, joint swelling, chest pain, shortness of breath, mood changes. POSITIVE muscle aches  Objective  unknown if currently breastfeeding.   General: No apparent distress alert and oriented x3 mood and affect normal, dressed appropriately.  HEENT: Pupils equal, extraocular movements intact  Respiratory: Patient's speak in full sentences and does not appear short of breath  Cardiovascular: No lower extremity edema, non tender, no erythema      Impression and Recommendations:

## 2022-08-19 ENCOUNTER — Ambulatory Visit: Payer: BC Managed Care – PPO | Admitting: Family Medicine

## 2022-08-26 ENCOUNTER — Encounter: Payer: Self-pay | Admitting: Cardiology

## 2022-08-26 ENCOUNTER — Other Ambulatory Visit: Payer: Self-pay | Admitting: Cardiology

## 2022-08-30 ENCOUNTER — Other Ambulatory Visit: Payer: Self-pay | Admitting: Family Medicine

## 2022-08-30 DIAGNOSIS — E119 Type 2 diabetes mellitus without complications: Secondary | ICD-10-CM

## 2022-09-09 ENCOUNTER — Other Ambulatory Visit: Payer: Self-pay | Admitting: Family Medicine

## 2022-09-09 DIAGNOSIS — E119 Type 2 diabetes mellitus without complications: Secondary | ICD-10-CM

## 2022-09-10 NOTE — Telephone Encounter (Signed)
Please call patient and let her know--we could try sending it to a  pharmacy-- please ask patient if this is ok and we will re-send the rx.

## 2022-09-10 NOTE — Telephone Encounter (Signed)
Left a detailed message with the information below at the patient's cell number and requested she call back with the [facility] location of her choice.

## 2022-09-19 IMAGING — CT CT HEART MORP W/ CTA COR W/ SCORE W/ CA W/CM &/OR W/O CM
4 of 7 series · 8 of 20 positions shown, 9 images · IV contrast (APPLIED)
Comparison: None.

Addendum:
CLINICAL DATA: 35 year old with chest pain

EXAM:
Cardiac/Coronary  CTA
TECHNIQUE: The patient was scanned on a Phillips Force scanner.

[Series 6: best syst · axial · 0.39mm/px · z∈[+1324,+1360]mm · 2 of 278 slices shown, 3 images]
[im 93/278  vessel]
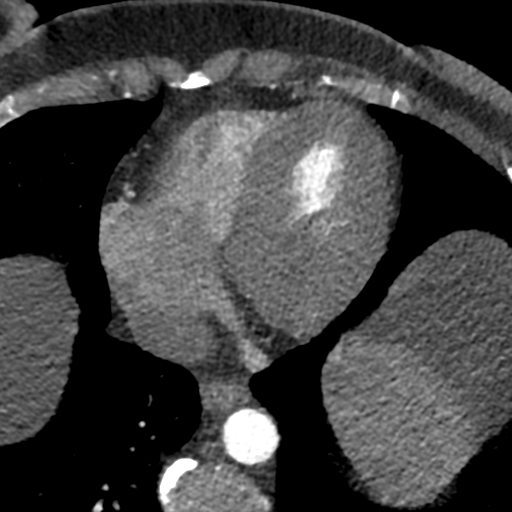
[im 93/278  lung]
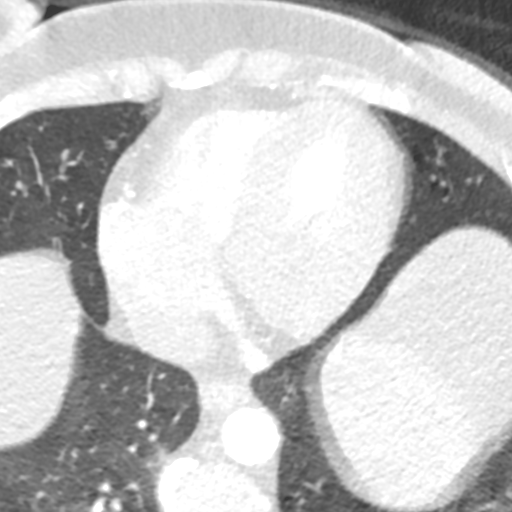
[im 185/278  vessel]
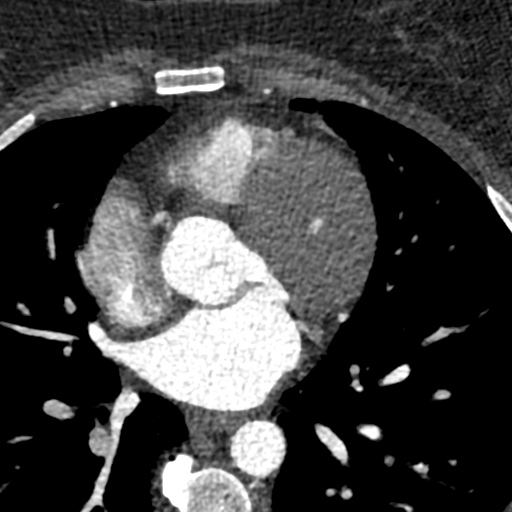

[Series 7: best diast · axial · 0.39mm/px · z∈[+1324,+1360]mm · 2 of 278 slices shown]
[im 93/278  vessel]
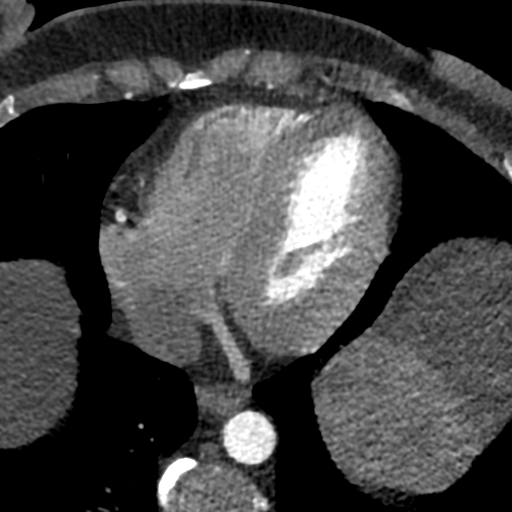
[im 185/278  vessel]
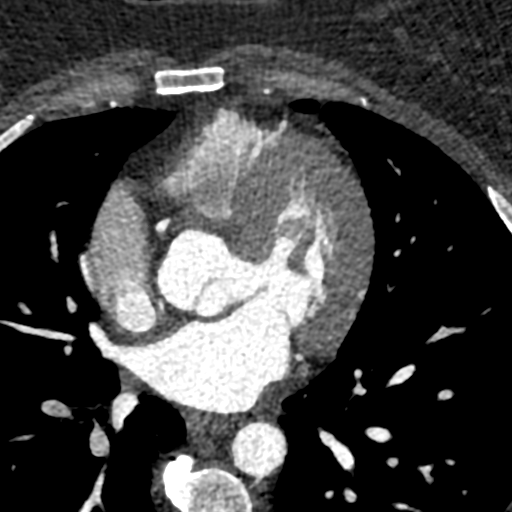

[Series 8: ts diast sharp · axial · 0.39mm/px · z∈[+1324,+1360]mm · 2 of 278 slices shown]
[im 93/278  lung]
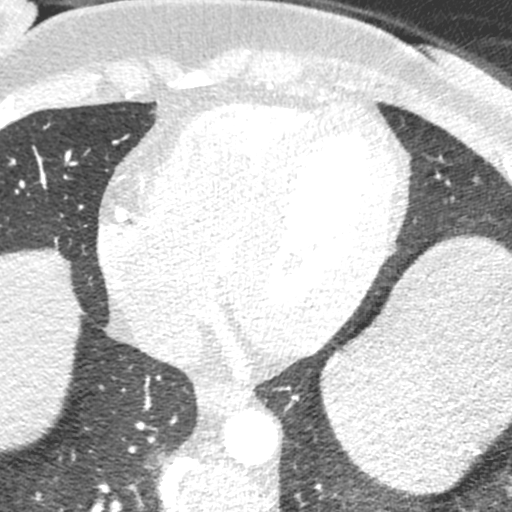
[im 185/278  lung]
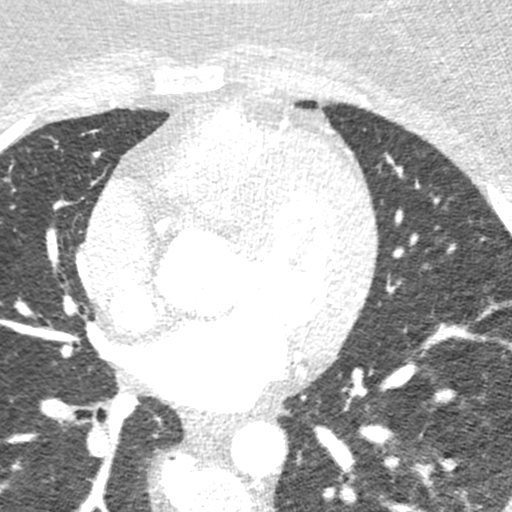

[Series 9: ts syst sharp · axial · 0.39mm/px · z∈[+1324,+1360]mm · 2 of 278 slices shown]
[im 93/278  lung]
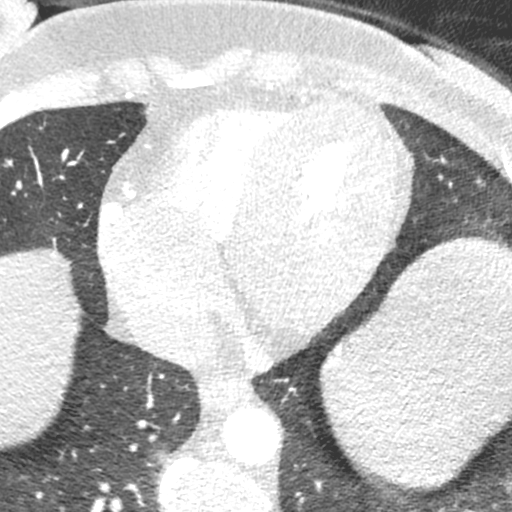
[im 185/278  lung]
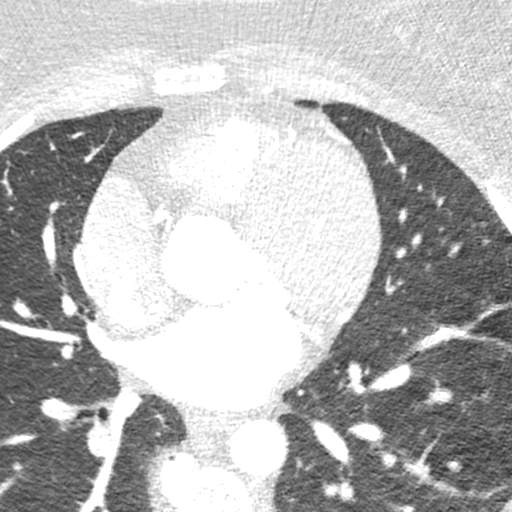

[8 of 20 positions shown; findings below may reference images not displayed]

FINDINGS: A 100 kV prospective scan was triggered in the descending thoracic
aorta at 111 HU's. Axial non-contrast 3 mm slices were carried out
through the heart. The data set was analyzed on a dedicated work
station and scored using the Agatson method. Gantry rotation speed
was 250 msecs and collimation was .6 mm. 0.8 mg of sl NTG was given.
The 3D data set was reconstructed in 5% intervals of the 67-82 % of
the R-R cycle. Diastolic phases were analyzed on a dedicated work
station using MPR, MIP and VRT modes. The patient received 80 cc of
contrast.

Image quality: Fair, misregistration artifact.

Aorta:  Normal size.  No calcifications.  No dissection.

Aortic Valve:  Trileaflet.  No calcifications.

Coronary Arteries:  Normal coronary origin.  Right dominance.

RCA is a large dominant artery that gives rise to PDA and PLA. There
is no plaque.

Left main is a large artery that gives rise to LAD and LCX arteries.

LAD is a large vessel that has no plaque.

LCX is a non-dominant artery that gives rise to one large OM1
branch. There is no plaque.

Other findings:

Normal pulmonary vein drainage into the left atrium.

Normal left atrial appendage without a thrombus.

Normal size of the pulmonary artery.

Please see radiology report for non cardiac findings.
IMPRESSION: 1. Coronary calcium score of 0.

2. Normal coronary origin with right dominance.

3. No evidence of CAD.

CAD-RADS 0. No evidence of CAD (0%). Consider non-atherosclerotic
causes of chest pain.

EXAM:
OVER-READ INTERPRETATION  CT CHEST

The following report is an over-read performed by radiologist Dr.
does not include interpretation of cardiac or coronary anatomy or
pathology. The coronary CTA interpretation by the cardiologist is
attached.
FINDINGS: Vascular: No significant vascular findings.

Mediastinum/Nodes: No lymphadenopathy within the field of view.

Lungs/Pleura: No focal airspace disease or suspicious pulmonary
nodule.

Upper Abdomen: There is a normal variant [place] liver extending
into the left upper quadrant along the spleen.

Musculoskeletal: No chest wall mass or suspicious bone lesions
identified within the field of view.
IMPRESSION: No significant extracardiac findings in the chest.

*** End of Addendum ***
FINDINGS: A 100 kV prospective scan was triggered in the descending thoracic
aorta at 111 HU's. Axial non-contrast 3 mm slices were carried out
through the heart. The data set was analyzed on a dedicated work
station and scored using the Agatson method. Gantry rotation speed
was 250 msecs and collimation was .6 mm. 0.8 mg of sl NTG was given.
The 3D data set was reconstructed in 5% intervals of the 67-82 % of
the R-R cycle. Diastolic phases were analyzed on a dedicated work
station using MPR, MIP and VRT modes. The patient received 80 cc of
contrast.

Image quality: Fair, misregistration artifact.

Aorta:  Normal size.  No calcifications.  No dissection.

Aortic Valve:  Trileaflet.  No calcifications.

Coronary Arteries:  Normal coronary origin.  Right dominance.

RCA is a large dominant artery that gives rise to PDA and PLA. There
is no plaque.

Left main is a large artery that gives rise to LAD and LCX arteries.

LAD is a large vessel that has no plaque.

LCX is a non-dominant artery that gives rise to one large OM1
branch. There is no plaque.

Other findings:

Normal pulmonary vein drainage into the left atrium.

Normal left atrial appendage without a thrombus.

Normal size of the pulmonary artery.

Please see radiology report for non cardiac findings.
IMPRESSION: 1. Coronary calcium score of 0.

2. Normal coronary origin with right dominance.

3. No evidence of CAD.

CAD-RADS 0. No evidence of CAD (0%). Consider non-atherosclerotic
causes of chest pain.

## 2022-09-24 ENCOUNTER — Telehealth: Payer: BC Managed Care – PPO | Admitting: Physician Assistant

## 2022-09-24 DIAGNOSIS — J069 Acute upper respiratory infection, unspecified: Secondary | ICD-10-CM | POA: Diagnosis not present

## 2022-09-24 MED ORDER — ALBUTEROL SULFATE HFA 108 (90 BASE) MCG/ACT IN AERS: 2.0000 | INHALATION_SPRAY | Freq: Four times a day (QID) | RESPIRATORY_TRACT | 0 refills | Status: DC | PRN

## 2022-09-24 MED ORDER — ONDANSETRON 4 MG PO TBDP: 4.0000 mg | ORAL_TABLET | Freq: Three times a day (TID) | ORAL | 0 refills | Status: DC | PRN

## 2022-09-24 MED ORDER — BENZONATATE 100 MG PO CAPS: 100.0000 mg | ORAL_CAPSULE | Freq: Three times a day (TID) | ORAL | 0 refills | Status: DC | PRN

## 2022-09-24 NOTE — Progress Notes (Signed)
I have spent 5 minutes in review of e-visit questionnaire, review and updating patient chart, medical decision making and response to patient.   William Cody Martin, PA-C    

## 2022-09-24 NOTE — Progress Notes (Signed)

## 2022-09-26 ENCOUNTER — Encounter: Payer: Self-pay | Admitting: Cardiology

## 2022-10-02 ENCOUNTER — Telehealth: Payer: BC Managed Care – PPO | Admitting: Physician Assistant

## 2022-10-02 DIAGNOSIS — R070 Pain in throat: Secondary | ICD-10-CM

## 2022-10-02 NOTE — Progress Notes (Signed)
Because of severity of pain, unilateral pain and radiation to the ear and need to make sure no concern for a tonsillar abscess, I feel your condition warrants further evaluation and I recommend that you be seen in a face to face visit.   NOTE: There will be NO CHARGE for this eVisit   If you are having a true medical emergency please call 911.      For an urgent face to face visit, [place] has eight urgent care centers for your convenience:   NEW!! [facility] at [facility] Get Driving Directions [phone] [address] [place], [address]    [facility] Center at [facility] Get Driving Directions [phone] [address] [place], [place] [address]   [facility]) Get Driving Directions [phone] [address] [place], [place] [address]  [facility] Center [facility]) Get Driving Directions [phone] [address] [place],  [place]  [address]  [facility] Urgent Care Center [facility] - at [employer]  [phone] [address] [place],  [place] [address]   [facility] at [facility] Get Driving Directions [phone] [address] [place], [place] [address]   [facility] Care at [facility] Get Driving Directions  [phone] [address] [place], [place] [address]   [facility] Health Urgent Care at [facility] Directions [phone] [address], [address] [place], [place] [address]  Your MyChart E-visit questionnaire answers were reviewed by a board certified advanced clinical practitioner to complete your personal care plan based on your specific symptoms.  Thank you for using e-Visits.

## 2022-10-03 ENCOUNTER — Ambulatory Visit: Payer: BC Managed Care – PPO | Admitting: Adult Health

## 2022-10-03 ENCOUNTER — Encounter: Payer: Self-pay | Admitting: Adult Health

## 2022-10-03 VITALS — BP 130/60 | HR 119 | Temp 99.2°F | Ht 66.0 in | Wt 269.8 lb

## 2022-10-03 DIAGNOSIS — B9789 Other viral agents as the cause of diseases classified elsewhere: Secondary | ICD-10-CM

## 2022-10-03 DIAGNOSIS — J028 Acute pharyngitis due to other specified organisms: Secondary | ICD-10-CM | POA: Diagnosis not present

## 2022-10-03 DIAGNOSIS — R6889 Other general symptoms and signs: Secondary | ICD-10-CM | POA: Diagnosis not present

## 2022-10-03 LAB — POCT INFLUENZA A/B
Influenza A, POC: NEGATIVE
Influenza B, POC: NEGATIVE

## 2022-10-03 LAB — POC COVID19 BINAXNOW: SARS Coronavirus 2 Ag: NEGATIVE

## 2022-10-03 LAB — POCT RAPID STREP A (OFFICE): Rapid Strep A Screen: NEGATIVE

## 2022-10-03 MED ORDER — MAGIC MOUTHWASH W/LIDOCAINE: 5.0000 mL | Freq: Three times a day (TID) | ORAL | 0 refills | Status: DC | PRN

## 2022-10-03 NOTE — Progress Notes (Signed)
Subjective:    Patient ID: Katie [name], female    DOB: 1985/11/26, 37 y.o.   MRN: [id number]  Sore Throat  This is a new problem. The current episode started yesterday. The pain is worse on the right side. There has been no fever. Associated symptoms include ear pain (right sided) and trouble swallowing (painful). Pertinent negatives include no congestion, coughing, ear discharge, headaches, hoarse voice or swollen glands. She has had no exposure to strep or mono. She has tried NSAIDs for the symptoms. The treatment provided no relief.      Review of Systems  HENT:  Positive for ear pain (right sided) and trouble swallowing (painful). Negative for congestion, ear discharge and hoarse voice.   Respiratory:  Negative for cough.   Neurological:  Negative for headaches.   Past Medical History:  Diagnosis Date   ADHD (attention deficit hyperactivity disorder) 03/01/2011   Anxiety    Arthritis    neck    Asthma    as a child   Borderline personality disorder (HCC) 05/26/2019   Bronchospasm 01/19/2014   Formatting of this note might be different from the original. Prn albuterol, WARI   Chronic hypertension in pregnancy 06/05/2020   Chronic neck pain 04/04/2019   Complex atypical endometrial hyperplasia 02/24/2019   Depression    Fibromyalgia    Hepatic steatosis    Hypertension    Hypertension during pregnancy in third trimester 04/02/2020   IBS (irritable bowel syndrome)    Newly diagnosed diabetes (HCC) 03/09/2019   Obesity    Panic attack    Torticollis 01/19/2014   Valium overdose     Social History   Socioeconomic History   Marital status: Single    Spouse name: Not on file   Number of children: Not on file   Years of education: Not on file   Highest education level: Some college, no degree  Occupational History   Not on file  Tobacco Use   Smoking status: Former    Current packs/day: 0.00    Average packs/day: 0.5 packs/day for 4.0 years (2.0 ttl pk-yrs)     Types: Cigarettes    Start date: 01/13/2001    Quit date: 01/13/2005    Years since quitting: 17.7   Smokeless tobacco: Never  Vaping Use   Vaping status: Never Used  Substance and Sexual Activity   Alcohol use: Not Currently   Drug use: No   Sexual activity: Not Currently  Other Topics Concern   Not on file  Social History Narrative   Not on file   Social Determinants of Health   Financial Resource Strain: Medium Risk (05/08/2021)   Overall Financial Resource Strain (CARDIA)    Difficulty of Paying Living Expenses: Somewhat hard  Food Insecurity: Food Insecurity Present (05/08/2021)   Hunger Vital Sign    Worried About Running Out of Food in the Last Year: Sometimes true    Ran Out of Food in the Last Year: Sometimes true  Transportation Needs: No Transportation Needs (05/08/2021)   PRAPARE - Administrator, Civil Service (Medical): No    Lack of Transportation (Non-Medical): No  Physical Activity: Sufficiently Active (05/08/2021)   Exercise Vital Sign    Days of Exercise per Week: 4 days    Minutes of Exercise per Session: 40 min  Stress: Stress Concern Present (05/08/2021)   [employer] of Occupational Health - Occupational Stress Questionnaire    Feeling of Stress : Very much  Social Connections: Socially  Isolated (05/08/2021)   Social Connection and Isolation Panel [NHANES]    Frequency of Communication with Friends and Family: More than three times a week    Frequency of Social Gatherings with Friends and Family: Three times a week    Attends Religious Services: Never    Active Member of Clubs or Organizations: No    Attends Banker Meetings: Not on file    Marital Status: Never married  Intimate Partner Violence: Not on file    Past Surgical History:  Procedure Laterality Date   big toes ingrown toenail removed     CESAREAN SECTION N/A 05/30/2020   Procedure: CESAREAN SECTION;  Surgeon: Shea Evans, MD;  Location: [facility];  Service:  Obstetrics;  Laterality: N/A;   DILATATION & CURETTAGE/HYSTEROSCOPY WITH MYOSURE N/A 02/24/2019   Procedure: DILATATION & CURETTAGE/HYSTEROSCOPY WITH MYOSURE;  Surgeon: Shea Evans, MD;  Location: [facility];  Service: Gynecology;  Laterality: N/A;   MOUTH SURGERY     "baby teeth removed,adult teeth  pulled down"   RADIOLOGY WITH ANESTHESIA N/A 06/07/2019   Procedure: MRI CERVICAL SPINE WITHOUT CONTRAST;  Surgeon: Radiologist, Medication, MD;  Location: [facility];  Service: Radiology;  Laterality: N/A;   wisdom teeth      Family History  Problem Relation Age of Onset   Heart disease Mother    Depression Mother    High blood pressure Mother    Kidney disease Mother    Psychosis Mother    Hearing loss Father    Depression Father    Anxiety disorder Father    Asthma Daughter    Learning disabilities Daughter    Heart disease Maternal Grandfather    Other Brother        fatty liver   Multiple sclerosis Maternal Grandmother    COPD Paternal Grandmother    Heart failure Paternal Grandmother    Heart attack Paternal Grandmother    Stroke Paternal Grandmother    Lung cancer Paternal Grandfather     Allergies  Allergen Reactions   Flexeril [Cyclobenzaprine Hcl] Shortness Of Breath   Lorazepam     Other reaction(s): Other Neck spasm    Current Outpatient Medications on File Prior to Visit  Medication Sig Dispense Refill   acetaminophen (TYLENOL) 500 MG tablet Take 2 tablets (1,000 mg total) by mouth every 6 (six) hours. (Patient taking differently: Take 1,000 mg by mouth as needed.) 30 tablet 0   albuterol (VENTOLIN HFA) 108 (90 Base) MCG/ACT inhaler Inhale 2 puffs into the lungs every 6 (six) hours as needed for wheezing or shortness of breath. 8 g 0   atorvastatin (LIPITOR) 10 MG tablet Take 1 tablet (10 mg total) by mouth daily. 30 tablet 6   azelastine (OPTIVAR) 0.05 % ophthalmic solution Place 1 drop into both eyes 2 (two) times daily. 6 mL 1   benzonatate  (TESSALON) 100 MG capsule Take 1 capsule (100 mg total) by mouth 3 (three) times daily as needed for cough. 30 capsule 0   blood glucose meter kit and supplies KIT Use as directed twice a day 1 each 0   Blood Glucose Monitoring Suppl (CONTOUR NEXT ONE) KIT Use as directed 1 kit 0   busPIRone (BUSPAR) 7.5 MG tablet Take 1 tablet (7.5 mg total) by mouth 3 (three) times daily. 90 tablet 1   cetirizine (ZYRTEC) 10 MG tablet Take 10 mg by mouth daily.     desvenlafaxine (PRISTIQ) 50 MG 24 hr tablet Take 1 tablet (50  mg total) by mouth daily. 90 tablet 1   famotidine (PEPCID) 20 MG tablet TAKE ONE TABLET BY MOUTH EVERY MORNING AND TAKE ONE TABLET BY MOUTH EVERY NIGHT AT BEDTIME 60 tablet 2   Ferrous Sulfate (IRON PO) Take by mouth daily.     fluticasone (FLONASE) 50 MCG/ACT nasal spray Place 2 sprays into both nostrils daily. 16 g 0   fluticasone (FLONASE) 50 MCG/ACT nasal spray Place 2 sprays into both nostrils daily. 16 g 6   furosemide (LASIX) 40 MG tablet Take 1 tablet (40 mg total) by mouth every other day. 45 tablet 3   gabapentin (NEURONTIN) 100 MG capsule Take 2 capsules (200 mg total) by mouth at bedtime. 60 capsule 0   glucose blood (CONTOUR NEXT TEST) test strip Use as instructed once a day 100 each 3   hydrochlorothiazide (HYDRODIURIL) 25 MG tablet Take 1 tablet (25 mg total) by mouth daily. 90 tablet 3   hydrOXYzine (ATARAX) 25 MG tablet Take 1 tablet (25 mg total) by mouth 3 (three) times daily as needed for anxiety. 30 tablet 2   metFORMIN (GLUCOPHAGE-XR) 750 MG 24 hr tablet Take 1 tablet (750 mg total) by mouth in the morning and at bedtime. 180 tablet 1   metoCLOPramide (REGLAN) 10 MG tablet Take 1 tablet (10 mg total) by mouth 3 (three) times daily before meals. 270 tablet 1   montelukast (SINGULAIR) 10 MG tablet Take 1 tablet (10 mg total) by mouth at bedtime. 30 tablet 3   naproxen (NAPROSYN) 500 MG tablet TAKE ONE TABLET BY MOUTH TWICE A DAY AS NEEDED FOR MODERATE PAIN 60 tablet 1    omeprazole (PRILOSEC) 40 MG capsule Take 1 capsule (40 mg total) by mouth daily. 90 capsule 0   ondansetron (ZOFRAN) 4 MG tablet Take 1 tablet (4 mg total) by mouth every 8 (eight) hours as needed for nausea or vomiting. 20 tablet 0   ondansetron (ZOFRAN) 4 MG tablet Take 1 tablet (4 mg total) by mouth every 8 (eight) hours as needed for nausea or vomiting. 20 tablet 0   ondansetron (ZOFRAN) 4 MG tablet Take 1 tablet (4 mg total) by mouth every 8 (eight) hours as needed for nausea or vomiting. 20 tablet 0   ondansetron (ZOFRAN-ODT) 4 MG disintegrating tablet Take 1 tablet (4 mg total) by mouth every 8 (eight) hours as needed for nausea or vomiting. 20 tablet 0   propranolol (INDERAL) 10 MG tablet Take 1 tablet (10 mg total) by mouth daily. 90 tablet 3   Semaglutide,0.25 or 0.5MG /DOS, (OZEMPIC, 0.25 OR 0.5 MG/DOSE,) 2 MG/1.5ML SOPN Inject 0.5 mg into the skin once a week. 1.5 mL 2   tirzepatide (MOUNJARO) 5 MG/0.5ML Pen Inject 5 mg into the skin once a week. 6 mL 5   tirzepatide (MOUNJARO) 5 MG/0.5ML Pen Inject 5 mg into the skin once a week. 6 mL 1   tiZANidine (ZANAFLEX) 2 MG tablet Take 1 tablet (2 mg total) by mouth 3 (three) times daily as needed for muscle spasms. 30 tablet 0   valsartan (DIOVAN) 80 MG tablet Take 1 tablet (80 mg total) by mouth daily. 90 tablet 3   venlafaxine XR (EFFEXOR-XR) 150 MG 24 hr capsule Take 150 mg by mouth daily with breakfast.     Vitamin D, Ergocalciferol, (DRISDOL) 1.25 MG (50000 UNIT) CAPS capsule TAKE ONE CAPSULE BY MOUTH ONCE WEEKLY 12 capsule 0   No current facility-administered medications on file prior to visit.    BP 130/60  Pulse (!) 119   Temp 99.2 F (37.3 C) (Oral)   Ht 5\' 6"  (1.676 m)   Wt 269 lb 12.8 oz (122.4 kg)   BMI 43.55 kg/m       Objective:   Physical Exam Vitals and nursing note reviewed.  Constitutional:      Appearance: Normal appearance.  HENT:     Right Ear: Hearing, tympanic membrane, ear canal and external ear normal.  No middle ear effusion. Tympanic membrane is not erythematous.     Left Ear: Hearing, tympanic membrane, ear canal and external ear normal.  No middle ear effusion. Tympanic membrane is not erythematous.     Mouth/Throat:     Pharynx: Oropharynx is clear. Uvula midline.     Tonsils: No tonsillar exudate or tonsillar abscesses.  Cardiovascular:     Rate and Rhythm: Regular rhythm. Tachycardia present.     Pulses: Normal pulses.     Heart sounds: Normal heart sounds.  Pulmonary:     Effort: Pulmonary effort is normal.     Breath sounds: Normal breath sounds.  Musculoskeletal:        General: Normal range of motion.  Lymphadenopathy:     Head:     Right side of head: No submental, submandibular, tonsillar, preauricular or posterior auricular adenopathy.     Left side of head: No submental, submandibular, tonsillar, preauricular or posterior auricular adenopathy.  Skin:    General: Skin is warm and dry.  Neurological:     General: No focal deficit present.     Mental Status: She is alert and oriented to person, place, and time.  Psychiatric:        Mood and Affect: Mood normal.        Behavior: Behavior normal.        Thought Content: Thought content normal.        Judgment: Judgment normal.       Assessment & Plan:  1. Flu-like symptoms  - POC COVID-19 BinaxNow- negative - POCT Influenza A/B- negative - POCT rapid strep A - negative  2. Viral sore throat - No signs fo strep throat or tonsillar abscess. Will provide prescription for magic mouth wash. Stay away from prednisone due to elevated A1c  - Can continue with NSAIDS and warm drinks  - magic mouthwash w/lidocaine SOLN; Take 5 mLs by mouth 3 (three) times daily as needed.  Dispense: 180 mL; Refill: 0 - Follow up in 3-4 days if not improving   Shirline Frees, NP

## 2022-10-05 ENCOUNTER — Encounter: Payer: Self-pay | Admitting: Adult Health

## 2022-10-06 ENCOUNTER — Ambulatory Visit: Payer: BC Managed Care – PPO | Admitting: Physician Assistant

## 2022-10-06 ENCOUNTER — Other Ambulatory Visit (HOSPITAL_BASED_OUTPATIENT_CLINIC_OR_DEPARTMENT_OTHER): Payer: Self-pay

## 2022-10-06 VITALS — BP 154/96 | HR 100 | Temp 98.4°F | Ht 66.0 in | Wt 266.8 lb

## 2022-10-06 DIAGNOSIS — Z7985 Long-term (current) use of injectable non-insulin antidiabetic drugs: Secondary | ICD-10-CM | POA: Diagnosis not present

## 2022-10-06 DIAGNOSIS — B084 Enteroviral vesicular stomatitis with exanthem: Secondary | ICD-10-CM

## 2022-10-06 DIAGNOSIS — E119 Type 2 diabetes mellitus without complications: Secondary | ICD-10-CM | POA: Diagnosis not present

## 2022-10-06 MED ORDER — IBUPROFEN 800 MG PO TABS
800.0000 mg | ORAL_TABLET | Freq: Three times a day (TID) | ORAL | 0 refills | Status: DC | PRN
  Filled 2022-10-06: qty 30, 10d supply, fill #0

## 2022-10-06 MED ORDER — TIRZEPATIDE 5 MG/0.5ML ~~LOC~~ SOAJ
5.0000 mg | SUBCUTANEOUS | 1 refills | Status: DC
  Filled 2022-10-06: qty 2, 28d supply, fill #0
  Filled 2022-11-07: qty 2, 28d supply, fill #1
  Filled 2022-12-16: qty 2, 28d supply, fill #2

## 2022-10-06 NOTE — Patient Instructions (Signed)
You have hand foot and mouth disease - this is viral and will go away on its own. You can return to work when you stop having new lesions show up and the ones you do have are crusting over / resolving.   Take Ibuprofen 800 mg three times daily (morning, noon, night). Tylenol 500 mg in-between Ibuprofen doses.   Calamine lotion & cold water soaks for hands and feet.  Try Pedialyte popsicles, ice chips, sugar-free jellos / soups / pudding.  STAY HYDRATED.

## 2022-10-06 NOTE — Progress Notes (Signed)
Subjective:    Patient ID: Katie [name], female    DOB: 02-18-1985, 37 y.o.   MRN: [id number]  Chief Complaint  Patient presents with   Sore Throat    Pt in the office c/o sore throat and blisters all POC tests last week were negative; Mouthwash and ibuprofen not giving much relief; pt states not doing any better, can open mouth a little more than before;     Sore Throat    Patient is in today for recheck on symptoms from 10/03/22. No fever or chills. Still having pain with swallowing, but slowly improving. New rash starting on hands. Has a 2 yo child in daycare who has been biting at his hands. No other symptoms.   Past Medical History:  Diagnosis Date   ADHD (attention deficit hyperactivity disorder) 03/01/2011   Anxiety    Arthritis    neck    Asthma    as a child   Borderline personality disorder (HCC) 05/26/2019   Bronchospasm 01/19/2014   Formatting of this note might be different from the original. Prn albuterol, WARI   Chronic hypertension in pregnancy 06/05/2020   Chronic neck pain 04/04/2019   Complex atypical endometrial hyperplasia 02/24/2019   Depression    Fibromyalgia    Hepatic steatosis    Hypertension    Hypertension during pregnancy in third trimester 04/02/2020   IBS (irritable bowel syndrome)    Newly diagnosed diabetes (HCC) 03/09/2019   Obesity    Panic attack    Torticollis 01/19/2014   Valium overdose     Past Surgical History:  Procedure Laterality Date   big toes ingrown toenail removed     CESAREAN SECTION N/A 05/30/2020   Procedure: CESAREAN SECTION;  Surgeon: Shea Evans, MD;  Location: [facility];  Service: Obstetrics;  Laterality: N/A;   DILATATION & CURETTAGE/HYSTEROSCOPY WITH MYOSURE N/A 02/24/2019   Procedure: DILATATION & CURETTAGE/HYSTEROSCOPY WITH MYOSURE;  Surgeon: Shea Evans, MD;  Location: [facility];  Service: Gynecology;  Laterality: N/A;   MOUTH SURGERY     "baby teeth removed,adult teeth  pulled down"    RADIOLOGY WITH ANESTHESIA N/A 06/07/2019   Procedure: MRI CERVICAL SPINE WITHOUT CONTRAST;  Surgeon: Radiologist, Medication, MD;  Location: [facility];  Service: Radiology;  Laterality: N/A;   wisdom teeth      Family History  Problem Relation Age of Onset   Heart disease Mother    Depression Mother    High blood pressure Mother    Kidney disease Mother    Psychosis Mother    Hearing loss Father    Depression Father    Anxiety disorder Father    Asthma Daughter    Learning disabilities Daughter    Heart disease Maternal Grandfather    Other Brother        fatty liver   Multiple sclerosis Maternal Grandmother    COPD Paternal Grandmother    Heart failure Paternal Grandmother    Heart attack Paternal Grandmother    Stroke Paternal Grandmother    Lung cancer Paternal Grandfather     Social History   Tobacco Use   Smoking status: Former    Current packs/day: 0.00    Average packs/day: 0.5 packs/day for 4.0 years (2.0 ttl pk-yrs)    Types: Cigarettes    Start date: 01/13/2001    Quit date: 01/13/2005    Years since quitting: 17.7   Smokeless tobacco: Never  Vaping Use   Vaping status: Never Used  Substance  Use Topics   Alcohol use: Not Currently   Drug use: No     Allergies  Allergen Reactions   Flexeril [Cyclobenzaprine Hcl] Shortness Of Breath   Lorazepam     Other reaction(s): Other Neck spasm    Review of Systems NEGATIVE UNLESS OTHERWISE INDICATED IN HPI      Objective:     BP (!) 154/96 (BP Location: Left Arm)   Pulse 100   Temp 98.4 F (36.9 C) (Temporal)   Ht 5\' 6"  (1.676 m)   Wt 266 lb 12.8 oz (121 kg)   SpO2 97%   BMI 43.06 kg/m   Wt Readings from Last 3 Encounters:  10/06/22 266 lb 12.8 oz (121 kg)  10/03/22 269 lb 12.8 oz (122.4 kg)  08/01/22 269 lb 12.8 oz (122.4 kg)    BP Readings from Last 3 Encounters:  10/06/22 (!) 154/96  10/03/22 130/60  08/01/22 120/79     Physical Exam Vitals and nursing note reviewed.  Constitutional:       Appearance: Normal appearance. She is obese.  HENT:     Nose: No congestion.     Mouth/Throat:     Pharynx: Uvula midline.     Tonsils: No tonsillar exudate or tonsillar abscesses.     Comments: Few ulcer lesions hard and soft palate, cheeks, as well as R tonsil Cardiovascular:     Rate and Rhythm: Normal rate and regular rhythm.     Pulses: Normal pulses.     Heart sounds: No murmur heard. Pulmonary:     Effort: Pulmonary effort is normal.     Breath sounds: Normal breath sounds.  Skin:    Findings: Lesion (maculopapular lesions on palms of hands) present.  Neurological:     Mental Status: She is alert.  Psychiatric:        Mood and Affect: Mood normal.        Assessment & Plan:  Hand, foot and mouth disease -     Ibuprofen; Take 1 tablet (800 mg total) by mouth every 8 (eight) hours as needed for moderate pain.  Dispense: 30 tablet; Refill: 0  Type 2 diabetes mellitus without complication, without long-term current use of insulin (HCC) -     Tirzepatide; Inject 5 mg into the skin once a week.  Dispense: 6 mL; Refill: 1    You have hand foot and mouth disease - this is viral and will go away on its own. You can return to work when you stop having new lesions show up and the ones you do have are crusting over / resolving.   Take Ibuprofen 800 mg three times daily (morning, noon, night). Tylenol 500 mg in-between Ibuprofen doses.   Calamine lotion & cold water soaks for hands and feet.  Try Pedialyte popsicles, ice chips, sugar-free jellos / soups / pudding.  STAY HYDRATED.    Return if symptoms worsen or fail to improve.   Marvina Danner M Jaydan Meidinger, PA-C

## 2022-10-07 NOTE — Telephone Encounter (Signed)
Pt seen another provider who dx her with hand foot and mouth

## 2022-10-10 ENCOUNTER — Other Ambulatory Visit: Payer: Self-pay | Admitting: Family Medicine

## 2022-10-10 DIAGNOSIS — K219 Gastro-esophageal reflux disease without esophagitis: Secondary | ICD-10-CM

## 2022-10-14 ENCOUNTER — Other Ambulatory Visit: Payer: Self-pay | Admitting: Family Medicine

## 2022-10-14 DIAGNOSIS — K219 Gastro-esophageal reflux disease without esophagitis: Secondary | ICD-10-CM

## 2022-10-16 ENCOUNTER — Ambulatory Visit: Payer: BC Managed Care – PPO | Admitting: Family Medicine

## 2022-11-05 ENCOUNTER — Telehealth: Payer: BC Managed Care – PPO | Admitting: Physician Assistant

## 2022-11-05 ENCOUNTER — Other Ambulatory Visit (HOSPITAL_BASED_OUTPATIENT_CLINIC_OR_DEPARTMENT_OTHER): Payer: Self-pay

## 2022-11-05 DIAGNOSIS — J069 Acute upper respiratory infection, unspecified: Secondary | ICD-10-CM

## 2022-11-05 MED ORDER — PSEUDOEPH-BROMPHEN-DM 30-2-10 MG/5ML PO SYRP
5.0000 mL | ORAL_SOLUTION | Freq: Four times a day (QID) | ORAL | 0 refills | Status: DC | PRN
Start: 2022-11-05 — End: 2023-08-17
  Filled 2022-11-05: qty 120, 6d supply, fill #0

## 2022-11-05 MED ORDER — IPRATROPIUM BROMIDE 0.03 % NA SOLN
2.0000 | Freq: Two times a day (BID) | NASAL | 0 refills | Status: DC
Start: 2022-11-05 — End: 2023-08-17
  Filled 2022-11-05: qty 30, 30d supply, fill #0

## 2022-11-05 NOTE — Progress Notes (Signed)

## 2022-11-07 ENCOUNTER — Other Ambulatory Visit (HOSPITAL_BASED_OUTPATIENT_CLINIC_OR_DEPARTMENT_OTHER): Payer: Self-pay

## 2022-11-07 ENCOUNTER — Other Ambulatory Visit: Payer: Self-pay

## 2022-11-08 ENCOUNTER — Other Ambulatory Visit (HOSPITAL_BASED_OUTPATIENT_CLINIC_OR_DEPARTMENT_OTHER): Payer: Self-pay

## 2022-11-13 ENCOUNTER — Other Ambulatory Visit (HOSPITAL_BASED_OUTPATIENT_CLINIC_OR_DEPARTMENT_OTHER): Payer: Self-pay

## 2022-11-13 ENCOUNTER — Telehealth: Payer: BC Managed Care – PPO | Admitting: Physician Assistant

## 2022-11-13 DIAGNOSIS — J208 Acute bronchitis due to other specified organisms: Secondary | ICD-10-CM | POA: Diagnosis not present

## 2022-11-13 DIAGNOSIS — J4521 Mild intermittent asthma with (acute) exacerbation: Secondary | ICD-10-CM | POA: Diagnosis not present

## 2022-11-13 DIAGNOSIS — B9689 Other specified bacterial agents as the cause of diseases classified elsewhere: Secondary | ICD-10-CM | POA: Diagnosis not present

## 2022-11-13 MED ORDER — PREDNISONE 20 MG PO TABS
40.0000 mg | ORAL_TABLET | Freq: Every day | ORAL | 0 refills | Status: DC
Start: 2022-11-13 — End: 2023-02-05
  Filled 2022-11-13: qty 10, 5d supply, fill #0

## 2022-11-13 MED ORDER — AZITHROMYCIN 250 MG PO TABS
ORAL_TABLET | ORAL | 0 refills | Status: AC
Start: 2022-11-13 — End: 2022-11-18
  Filled 2022-11-13: qty 6, 5d supply, fill #0

## 2022-11-13 MED ORDER — BENZONATATE 100 MG PO CAPS
100.0000 mg | ORAL_CAPSULE | Freq: Three times a day (TID) | ORAL | 0 refills | Status: DC | PRN
Start: 2022-11-13 — End: 2023-02-01
  Filled 2022-11-13: qty 30, 10d supply, fill #0

## 2022-11-13 NOTE — Progress Notes (Signed)
E-Visit for Cough   We are sorry that you are not feeling well.  Here is how we plan to help!  Based on your presentation I believe you most likely have A cough due to bacteria.  When patients have a fever and a productive cough with a change in color or increased sputum production, we are concerned about bacterial bronchitis.  If left untreated it can progress to pneumonia.  If your symptoms do not improve with your treatment plan it is important that you contact your provider.   I have prescribed Azithromyin 250 mg: two tablets now and then one tablet daily for 4 additonal days    In addition you may use A prescription cough medication called Tessalon Perles 100mg . You may take 1-2 capsules every 8 hours as needed for your cough. I have also prescribed a short course of prednisone to help relax your airways further.   From your responses in the eVisit questionnaire you describe inflammation in the upper respiratory tract which is causing a significant cough.  This is commonly called Bronchitis and has four common causes:   Allergies Viral Infections Acid Reflux Bacterial Infection Allergies, viruses and acid reflux are treated by controlling symptoms or eliminating the cause. An example might be a cough caused by taking certain blood pressure medications. You stop the cough by changing the medication. Another example might be a cough caused by acid reflux. Controlling the reflux helps control the cough.  USE OF BRONCHODILATOR ("RESCUE") INHALERS: There is a risk from using your bronchodilator too frequently.  The risk is that over-reliance on a medication which only relaxes the muscles surrounding the breathing tubes can reduce the effectiveness of medications prescribed to reduce swelling and congestion of the tubes themselves.  Although you feel brief relief from the bronchodilator inhaler, your asthma may actually be worsening with the tubes becoming more swollen and filled with mucus.  This  can delay other crucial treatments, such as oral steroid medications. If you need to use a bronchodilator inhaler daily, several times per day, you should discuss this with your provider.  There are probably better treatments that could be used to keep your asthma under control.     HOME CARE Only take medications as instructed by your medical team. Complete the entire course of an antibiotic. Drink plenty of fluids and get plenty of rest. Avoid close contacts especially the very young and the elderly Cover your mouth if you cough or cough into your sleeve. Always remember to wash your hands A steam or ultrasonic humidifier can help congestion.   GET HELP RIGHT AWAY IF: You develop worsening fever. You become short of breath You cough up blood. Your symptoms persist after you have completed your treatment plan MAKE SURE YOU  Understand these instructions. Will watch your condition. Will get help right away if you are not doing well or get worse.    Thank you for choosing an e-visit.  Your e-visit answers were reviewed by a board certified advanced clinical practitioner to complete your personal care plan. Depending upon the condition, your plan could have included both over the counter or prescription medications.  Please review your pharmacy choice. Make sure the pharmacy is open so you can pick up prescription now. If there is a problem, you may contact your provider through [employer] and have the prescription routed to another pharmacy.  Your safety is important to [place]. If you have drug allergies check your prescription carefully.   For the  next 24 hours you can use MyChart to ask questions about today's visit, request a non-urgent call back, or ask for a work or school excuse. You will get an email in the next two days asking about your experience. I hope that your e-visit has been valuable and will speed your recovery.

## 2022-11-13 NOTE — Progress Notes (Signed)
I have spent 5 minutes in review of e-visit questionnaire, review and updating patient chart, medical decision making and response to patient.   Mia Milan Cody Jacklynn Dehaas, PA-C    

## 2022-12-16 ENCOUNTER — Other Ambulatory Visit (HOSPITAL_BASED_OUTPATIENT_CLINIC_OR_DEPARTMENT_OTHER): Payer: Self-pay

## 2022-12-16 ENCOUNTER — Other Ambulatory Visit: Payer: Self-pay | Admitting: Physician Assistant

## 2022-12-16 ENCOUNTER — Other Ambulatory Visit: Payer: Self-pay | Admitting: Family Medicine

## 2022-12-16 ENCOUNTER — Other Ambulatory Visit: Payer: Self-pay

## 2022-12-16 ENCOUNTER — Encounter (HOSPITAL_BASED_OUTPATIENT_CLINIC_OR_DEPARTMENT_OTHER): Payer: Self-pay

## 2022-12-16 DIAGNOSIS — Z794 Long term (current) use of insulin: Secondary | ICD-10-CM

## 2022-12-16 DIAGNOSIS — K219 Gastro-esophageal reflux disease without esophagitis: Secondary | ICD-10-CM

## 2022-12-16 DIAGNOSIS — E119 Type 2 diabetes mellitus without complications: Secondary | ICD-10-CM

## 2022-12-16 DIAGNOSIS — M797 Fibromyalgia: Secondary | ICD-10-CM

## 2022-12-16 MED ORDER — MOUNJARO 5 MG/0.5ML ~~LOC~~ SOAJ
5.0000 mg | SUBCUTANEOUS | 0 refills | Status: DC
  Filled 2022-12-16: qty 2, 28d supply, fill #0

## 2022-12-16 MED ORDER — OMEPRAZOLE 40 MG PO CPDR
40.0000 mg | DELAYED_RELEASE_CAPSULE | Freq: Every day | ORAL | 0 refills | Status: DC
  Filled 2022-12-16 – 2023-03-23 (×2): qty 90, 90d supply, fill #0

## 2022-12-16 NOTE — Telephone Encounter (Signed)
Pt last saw me in July and was supposed to see me for a 3 month follow up.. please have patient conitnue the 5 mg mounjaro weekly -- we need to check her A1C in the office to see if she is responding to the treatment.

## 2022-12-16 NOTE — Telephone Encounter (Signed)
Have pt schedule a visit with me in the office to check her A1C, ok to refill the 5 mg mounjaro

## 2022-12-16 NOTE — Telephone Encounter (Signed)
Please see patient message and advise; since you are no longer showing listed as patient PCP and not seen this patient since September. Please advise on increased dose

## 2022-12-16 NOTE — Telephone Encounter (Signed)
Rx sent as appt was previously scheduled for 01/16/2023.

## 2022-12-17 ENCOUNTER — Other Ambulatory Visit: Payer: BC Managed Care – PPO

## 2022-12-18 ENCOUNTER — Encounter: Payer: Self-pay | Admitting: Cardiology

## 2022-12-18 ENCOUNTER — Other Ambulatory Visit: Payer: Self-pay

## 2022-12-18 ENCOUNTER — Encounter: Payer: Self-pay | Admitting: Family Medicine

## 2022-12-18 DIAGNOSIS — E1165 Type 2 diabetes mellitus with hyperglycemia: Secondary | ICD-10-CM

## 2022-12-19 ENCOUNTER — Other Ambulatory Visit: Payer: BC Managed Care – PPO

## 2022-12-20 LAB — COMPREHENSIVE METABOLIC PANEL
AG Ratio: 1.7 (calc) (ref 1.0–2.5)
ALT: 38 U/L — ABNORMAL HIGH (ref 6–29)
AST: 54 U/L — ABNORMAL HIGH (ref 10–30)
Albumin: 4.1 g/dL (ref 3.6–5.1)
Alkaline phosphatase (APISO): 44 U/L (ref 31–125)
BUN: 12 mg/dL (ref 7–25)
CO2: 30 mmol/L (ref 20–32)
Calcium: 8.8 mg/dL (ref 8.6–10.2)
Chloride: 103 mmol/L (ref 98–110)
Creat: 0.73 mg/dL (ref 0.50–0.97)
Globulin: 2.4 g/dL (ref 1.9–3.7)
Glucose, Bld: 177 mg/dL — ABNORMAL HIGH (ref 65–99)
Potassium: 3.7 mmol/L (ref 3.5–5.3)
Sodium: 142 mmol/L (ref 135–146)
Total Bilirubin: 0.4 mg/dL (ref 0.2–1.2)
Total Protein: 6.5 g/dL (ref 6.1–8.1)

## 2022-12-20 LAB — LIPID PANEL
Cholesterol: 119 mg/dL (ref <200)
HDL: 33 mg/dL — ABNORMAL LOW (ref >=50)
LDL Cholesterol (Calc): 61 mg/dL
Non-HDL Cholesterol (Calc): 86 mg/dL (ref <130)
Total CHOL/HDL Ratio: 3.6 (calc) (ref <5.0)
Triglycerides: 186 mg/dL — ABNORMAL HIGH (ref <150)

## 2022-12-20 LAB — MICROALBUMIN / CREATININE URINE RATIO
Creatinine, Urine: 274 mg/dL (ref 20–275)
Microalb Creat Ratio: 77 mg/g{creat} — ABNORMAL HIGH (ref <30)
Microalb, Ur: 21.2 mg/dL

## 2022-12-20 LAB — HEMOGLOBIN A1C
Hgb A1c MFr Bld: 8.1 %{Hb} — ABNORMAL HIGH (ref <5.7)
Mean Plasma Glucose: 186 mg/dL
eAG (mmol/L): 10.3 mmol/L

## 2022-12-22 ENCOUNTER — Encounter: Payer: Self-pay | Admitting: Cardiology

## 2022-12-22 ENCOUNTER — Other Ambulatory Visit (HOSPITAL_BASED_OUTPATIENT_CLINIC_OR_DEPARTMENT_OTHER): Payer: Self-pay

## 2022-12-22 ENCOUNTER — Encounter: Payer: Self-pay | Admitting: "Endocrinology

## 2022-12-22 ENCOUNTER — Ambulatory Visit: Payer: BC Managed Care – PPO | Admitting: Cardiology

## 2022-12-22 ENCOUNTER — Other Ambulatory Visit: Payer: Self-pay

## 2022-12-22 ENCOUNTER — Ambulatory Visit: Payer: BC Managed Care – PPO | Admitting: "Endocrinology

## 2022-12-22 VITALS — BP 120/84 | HR 110 | Ht 66.0 in | Wt 256.0 lb

## 2022-12-22 VITALS — BP 120/84 | HR 110 | Ht 66.0 in | Wt 256.6 lb

## 2022-12-22 DIAGNOSIS — E785 Hyperlipidemia, unspecified: Secondary | ICD-10-CM | POA: Diagnosis not present

## 2022-12-22 DIAGNOSIS — Z7984 Long term (current) use of oral hypoglycemic drugs: Secondary | ICD-10-CM | POA: Diagnosis not present

## 2022-12-22 DIAGNOSIS — I10 Essential (primary) hypertension: Secondary | ICD-10-CM

## 2022-12-22 DIAGNOSIS — E782 Mixed hyperlipidemia: Secondary | ICD-10-CM | POA: Diagnosis not present

## 2022-12-22 DIAGNOSIS — E1165 Type 2 diabetes mellitus with hyperglycemia: Secondary | ICD-10-CM | POA: Diagnosis not present

## 2022-12-22 DIAGNOSIS — Z7985 Long-term (current) use of injectable non-insulin antidiabetic drugs: Secondary | ICD-10-CM

## 2022-12-22 MED ORDER — DEXCOM G7 SENSOR MISC
1.0000 | 0 refills | Status: DC
  Filled 2022-12-22: qty 3, 30d supply, fill #0

## 2022-12-22 MED ORDER — TIRZEPATIDE 7.5 MG/0.5ML ~~LOC~~ SOAJ
7.5000 mg | SUBCUTANEOUS | 0 refills | Status: DC
  Filled 2022-12-22 – 2023-01-13 (×2): qty 2, 28d supply, fill #0

## 2022-12-22 MED ORDER — CONTOUR NEXT TEST VI STRP
ORAL_STRIP | 3 refills | Status: DC
  Filled 2022-12-22: qty 100, 33d supply, fill #0

## 2022-12-22 MED ORDER — VALSARTAN 80 MG PO TABS
80.0000 mg | ORAL_TABLET | Freq: Two times a day (BID) | ORAL | 3 refills | Status: DC
  Filled 2022-12-22 – 2022-12-24 (×3): qty 180, 90d supply, fill #0

## 2022-12-22 MED ORDER — BLOOD PRESSURE MONITOR AUTOMAT DEVI
1.0000 [IU] | Freq: Once | 0 refills | Status: AC
  Filled 2022-12-22: qty 1, 30d supply, fill #0

## 2022-12-22 NOTE — Progress Notes (Signed)
Outpatient Endocrinology Note Katie Morris, MD  12/22/22   Katie [name] 02-02-1985 [id number]  Referring Provider: Thomasene Ripple, DO Primary Care Provider: Karie Georges, MD Reason for consultation: Subjective   Assessment & Plan  Diagnoses and all orders for this visit:  Uncontrolled type 2 diabetes mellitus with hyperglycemia (HCC) -     tirzepatide (MOUNJARO) 7.5 MG/0.5ML Pen; Inject 7.5 mg into the skin once a week. -     glucose blood (CONTOUR NEXT TEST) test strip; Check 3 times a day -     Continuous Glucose Sensor (DEXCOM G7 SENSOR) MISC; Use 1 Device continuous. -     Ambulatory referral to diabetic education  Long term (current) use of oral hypoglycemic drugs  Long-term (current) use of injectable non-insulin antidiabetic drugs  Mixed hypercholesterolemia and hypertriglyceridemia    Diabetes Type II complicated by neuropathy  Lab Results  Component Value Date   GFR 115.39 04/01/2021   Hba1c goal less than 7, current Hba1c is  Lab Results  Component Value Date   HGBA1C 8.1 (H) 12/19/2022   Will recommend the following: Metformin XR 750 mg bid Mounjaro 7.5 mg every day  Ordered DexCom  Ordered DM education  No known contraindications/side effects to any of above medications No history of MEN syndrome/medullary thyroid cancer/pancreatitis or pancreatic cancer in self or family  -Last LD and Tg are as follows: Lab Results  Component Value Date   LDLCALC 61 12/19/2022    Lab Results  Component Value Date   TRIG 186 (H) 12/19/2022   -On lipitor 10 mg QD -Follow low fat diet and exercise   -Blood pressure goal <140/90 - Microalbumin/creatinine goal is < 30 -Last MA/Cr is as follows: Lab Results  Component Value Date   MICROALBUR 21.2 12/19/2022   -on ACE/ARB valsartan 80 mg qd -diet changes including salt restriction -limit eating outside -counseled BP targets per standards of diabetes care -uncontrolled blood pressure  can lead to retinopathy, nephropathy and cardiovascular and atherosclerotic heart disease  Reviewed and counseled on: -A1C target -Blood sugar targets -Complications of uncontrolled diabetes  -Checking blood sugar before meals and bedtime and bring log next visit -All medications with mechanism of action and side effects -Hypoglycemia management: rule of 15's, Glucagon Emergency Kit and medical alert ID -low-carb low-fat plate-method diet -At least 20 minutes of physical activity per day -Annual dilated retinal eye exam and foot exam -compliance and follow up needs -follow up as scheduled or earlier if problem gets worse  Call if blood sugar is less than 70 or consistently above 250    Take a 15 gm snack of carbohydrate at bedtime before you go to sleep if your blood sugar is less than 100.    If you are going to fast after midnight for a test or procedure, ask your physician for instructions on how to reduce/decrease your insulin dose.    Call if blood sugar is less than 70 or consistently above 250  -Treating a low sugar by rule of 15  (15 gms of sugar every 15 min until sugar is more than 70) If you feel your sugar is low, test your sugar to be sure If your sugar is low (less than 70), then take 15 grams of a fast acting Carbohydrate (3-4 glucose tablets or glucose gel or 4 ounces of juice or regular soda) Recheck your sugar 15 min after treating low to make sure it is more than 70 If sugar is still less than  70, treat again with 15 grams of carbohydrate          Don't drive the hour of hypoglycemia  If unconscious/unable to eat or drink by mouth, use glucagon injection or nasal spray baqsimi and call 911. Can repeat again in 15 min if still unconscious.  Return in about 22 days (around 01/13/2023).   I have reviewed current medications, nurse's notes, allergies, vital signs, past medical and surgical history, family medical history, and social history for this encounter.  Counseled patient on symptoms, examination findings, lab findings, imaging results, treatment decisions and monitoring and prognosis. The patient understood the recommendations and agrees with the treatment plan. All questions regarding treatment plan were fully answered.  Katie Gratiot, MD  12/22/22    History of Present Illness Katie [name] is a 37 y.o. year old female who presents for evaluation of Type II diabetes mellitus.   Katie [name] was first diagnosed in 2021 (had gestational diabetes 2007).   Diabetes education +  Home diabetes regimen: Metformin XR 750 mg bid Mounjaro 5 mg every day   COMPLICATIONS -  MI/Stroke -  retinopathy +  neuropathy -  nephropathy  SYMPTOMS REVIEWED - Polyuria - Weight loss - Blurred vision  BLOOD SUGAR DATA Not checking BG  Physical Exam  BP 120/84   Pulse (!) 110   Ht 5\' 6"  (1.676 m)   Wt 256 lb 9.6 oz (116.4 kg)   SpO2 98%   BMI 41.42 kg/m    Constitutional: well developed, well nourished Head: normocephalic, atraumatic Eyes: sclera anicteric, no redness Neck: supple Lungs: normal respiratory effort Neurology: alert and oriented Skin: dry, no appreciable rashes Musculoskeletal: no appreciable defects Psychiatric: normal mood and affect Diabetic Foot Exam - Simple   Simple Foot Form Diabetic Foot exam was performed with the following findings: Yes 12/22/2022  1:30 PM  Visual Inspection No deformities, no ulcerations, no other skin breakdown bilaterally: Yes Sensation Testing Intact to touch and monofilament testing bilaterally: Yes Pulse Check Posterior Tibialis and Dorsalis pulse intact bilaterally: Yes Comments      Current Medications Patient's Medications  New Prescriptions   CONTINUOUS GLUCOSE SENSOR (DEXCOM G7 SENSOR) MISC    Use 1 Device continuous.   TIRZEPATIDE (MOUNJARO) 7.5 MG/0.5ML PEN    Inject 7.5 mg into the skin once a week.  Previous Medications   ACETAMINOPHEN  (TYLENOL) 500 MG TABLET    Take 2 tablets (1,000 mg total) by mouth every 6 (six) hours.   ALBUTEROL (VENTOLIN HFA) 108 (90 BASE) MCG/ACT INHALER    Inhale 2 puffs into the lungs every 6 (six) hours as needed for wheezing or shortness of breath.   ATORVASTATIN (LIPITOR) 10 MG TABLET    Take 1 tablet (10 mg total) by mouth daily.   BENZONATATE (TESSALON) 100 MG CAPSULE    Take 1 capsule (100 mg total) by mouth 3 (three) times daily as needed for cough.   BLOOD GLUCOSE METER KIT AND SUPPLIES KIT    Use as directed twice a day   BLOOD GLUCOSE MONITORING SUPPL (CONTOUR NEXT ONE) KIT    Use as directed   BROMPHENIRAMINE-PSEUDOEPHEDRINE-DM 30-2-10 MG/5ML SYRUP    Take 5 mLs by mouth 4 (four) times daily as needed.   BUSPIRONE (BUSPAR) 7.5 MG TABLET    Take 1 tablet (7.5 mg total) by mouth 3 (three) times daily.   CETIRIZINE (ZYRTEC) 10 MG TABLET    Take 10 mg by mouth daily.   DESVENLAFAXINE (PRISTIQ) 50 MG  24 HR TABLET    Take 1 tablet (50 mg total) by mouth daily.   FAMOTIDINE (PEPCID) 20 MG TABLET    TAKE 1 TABLET BY MOUTH EVERY MORNING AND TAKE 1 TABLET BY MOUTH EVERY NIGHT AT BEDTIME   FERROUS SULFATE (IRON PO)    Take by mouth daily.   FLUTICASONE (FLONASE) 50 MCG/ACT NASAL SPRAY    Place 2 sprays into both nostrils daily.   FUROSEMIDE (LASIX) 40 MG TABLET    Take 1 tablet (40 mg total) by mouth every other day.   GABAPENTIN (NEURONTIN) 100 MG CAPSULE    Take 2 capsules (200 mg total) by mouth at bedtime.   HYDROCHLOROTHIAZIDE (HYDRODIURIL) 25 MG TABLET    Take 1 tablet (25 mg total) by mouth daily.   HYDROXYZINE (ATARAX) 25 MG TABLET    Take 1 tablet (25 mg total) by mouth 3 (three) times daily as needed for anxiety.   IBUPROFEN (ADVIL) 800 MG TABLET    Take 1 tablet (800 mg total) by mouth every 8 (eight) hours as needed for moderate pain.   IPRATROPIUM (ATROVENT) 0.03 % NASAL SPRAY    Place 2 sprays into both nostrils every 12 (twelve) hours.   MAGIC MOUTHWASH W/LIDOCAINE SOLN    Take 5 mLs by  mouth 3 (three) times daily as needed.   METFORMIN (GLUCOPHAGE-XR) 750 MG 24 HR TABLET    Take 1 tablet (750 mg total) by mouth in the morning and at bedtime.   METOCLOPRAMIDE (REGLAN) 10 MG TABLET    Take 1 tablet (10 mg total) by mouth 3 (three) times daily before meals.   MONTELUKAST (SINGULAIR) 10 MG TABLET    Take 1 tablet (10 mg total) by mouth at bedtime.   NAPROXEN (NAPROSYN) 500 MG TABLET    TAKE 1 TABLET BY MOUTH 2 TIMES A DAY AS NEEDED FOR MODERATE PAIN   OMEPRAZOLE (PRILOSEC) 40 MG CAPSULE    Take 1 capsule (40 mg total) by mouth daily.   ONDANSETRON (ZOFRAN) 4 MG TABLET    Take 1 tablet (4 mg total) by mouth every 8 (eight) hours as needed for nausea or vomiting.   PREDNISONE (DELTASONE) 20 MG TABLET    Take 2 tablets (40 mg total) by mouth daily with breakfast.   PROPRANOLOL (INDERAL) 10 MG TABLET    Take 1 tablet (10 mg total) by mouth daily.   TIZANIDINE (ZANAFLEX) 2 MG TABLET    Take 1 tablet (2 mg total) by mouth 3 (three) times daily as needed for muscle spasms.   VALSARTAN (DIOVAN) 80 MG TABLET    Take 1 tablet (80 mg total) by mouth daily.   VENLAFAXINE XR (EFFEXOR-XR) 150 MG 24 HR CAPSULE    Take 150 mg by mouth daily with breakfast.   VITAMIN D, ERGOCALCIFEROL, (DRISDOL) 1.25 MG (50000 UNIT) CAPS CAPSULE    TAKE ONE CAPSULE BY MOUTH ONCE WEEKLY  Modified Medications   Modified Medication Previous Medication   GLUCOSE BLOOD (CONTOUR NEXT TEST) TEST STRIP glucose blood (CONTOUR NEXT TEST) test strip      Check 3 times a day    Use as instructed once a day  Discontinued Medications   FLUTICASONE (FLONASE) 50 MCG/ACT NASAL SPRAY    Place 2 sprays into both nostrils daily.   ONDANSETRON (ZOFRAN) 4 MG TABLET    Take 1 tablet (4 mg total) by mouth every 8 (eight) hours as needed for nausea or vomiting.   ONDANSETRON (ZOFRAN) 4 MG TABLET  Take 1 tablet (4 mg total) by mouth every 8 (eight) hours as needed for nausea or vomiting.   ONDANSETRON (ZOFRAN-ODT) 4 MG DISINTEGRATING  TABLET    Take 1 tablet (4 mg total) by mouth every 8 (eight) hours as needed for nausea or vomiting.   TIRZEPATIDE (MOUNJARO) 5 MG/0.5ML PEN    Inject 5 mg into the skin once a week.   TIRZEPATIDE (MOUNJARO) 5 MG/0.5ML PEN    Inject 5 mg into the skin once a week.    Allergies Allergies  Allergen Reactions   Flexeril [Cyclobenzaprine Hcl] Shortness Of Breath   Lorazepam     Other reaction(s): Other Neck spasm    Past Medical History Past Medical History:  Diagnosis Date   ADHD (attention deficit hyperactivity disorder) 03/01/2011   Anxiety    Arthritis    neck    Asthma    as a child   Borderline personality disorder (HCC) 05/26/2019   Bronchospasm 01/19/2014   Formatting of this note might be different from the original. Prn albuterol, WARI   Chronic hypertension in pregnancy 06/05/2020   Chronic neck pain 04/04/2019   Complex atypical endometrial hyperplasia 02/24/2019   Depression    Fibromyalgia    Hepatic steatosis    Hypertension    Hypertension during pregnancy in third trimester 04/02/2020   IBS (irritable bowel syndrome)    Newly diagnosed diabetes (HCC) 03/09/2019   Obesity    Panic attack    Torticollis 01/19/2014   Valium overdose     Past Surgical History Past Surgical History:  Procedure Laterality Date   big toes ingrown toenail removed     CESAREAN SECTION N/A 05/30/2020   Procedure: CESAREAN SECTION;  Surgeon: Shea Evans, MD;  Location: [facility];  Service: Obstetrics;  Laterality: N/A;   DILATATION & CURETTAGE/HYSTEROSCOPY WITH MYOSURE N/A 02/24/2019   Procedure: DILATATION & CURETTAGE/HYSTEROSCOPY WITH MYOSURE;  Surgeon: Shea Evans, MD;  Location: [facility];  Service: Gynecology;  Laterality: N/A;   MOUTH SURGERY     "baby teeth removed,adult teeth  pulled down"   RADIOLOGY WITH ANESTHESIA N/A 06/07/2019   Procedure: MRI CERVICAL SPINE WITHOUT CONTRAST;  Surgeon: Radiologist, Medication, MD;  Location: [facility] OR;  Service: Radiology;   Laterality: N/A;   wisdom teeth      Family History family history includes Anxiety disorder in her father; Asthma in her daughter; COPD in her paternal grandmother; Depression in her father and mother; Hearing loss in her father; Heart attack in her paternal grandmother; Heart disease in her maternal grandfather and mother; Heart failure in her paternal grandmother; High blood pressure in her mother; Kidney disease in her mother; Learning disabilities in her daughter; Lung cancer in her paternal grandfather; Multiple sclerosis in her maternal grandmother; Other in her brother; Psychosis in her mother; Stroke in her paternal grandmother.  Social History Social History   Socioeconomic History   Marital status: Single    Spouse name: Not on file   Number of children: Not on file   Years of education: Not on file   Highest education level: Some college, no degree  Occupational History   Not on file  Tobacco Use   Smoking status: Former    Current packs/day: 0.00    Average packs/day: 0.5 packs/day for 4.0 years (2.0 ttl pk-yrs)    Types: Cigarettes    Start date: 01/13/2001    Quit date: 01/13/2005    Years since quitting: 17.9   Smokeless tobacco: Never  Vaping Use   Vaping status: Never Used  Substance and Sexual Activity   Alcohol use: Not Currently   Drug use: No   Sexual activity: Not Currently  Other Topics Concern   Not on file  Social History Narrative   Not on file   Social Determinants of Health   Financial Resource Strain: Medium Risk (05/08/2021)   Overall Financial Resource Strain (CARDIA)    Difficulty of Paying Living Expenses: Somewhat hard  Food Insecurity: Food Insecurity Present (05/08/2021)   Hunger Vital Sign    Worried About Running Out of Food in the Last Year: Sometimes true    Ran Out of Food in the Last Year: Sometimes true  Transportation Needs: No Transportation Needs (05/08/2021)   PRAPARE - Administrator, Civil Service (Medical): No     Lack of Transportation (Non-Medical): No  Physical Activity: Sufficiently Active (05/08/2021)   Exercise Vital Sign    Days of Exercise per Week: 4 days    Minutes of Exercise per Session: 40 min  Stress: Stress Concern Present (05/08/2021)   [employer] of Occupational Health - Occupational Stress Questionnaire    Feeling of Stress : Very much  Social Connections: Socially Isolated (05/08/2021)   Social Connection and Isolation Panel [NHANES]    Frequency of Communication with Friends and Family: More than three times a week    Frequency of Social Gatherings with Friends and Family: Three times a week    Attends Religious Services: Never    Active Member of Clubs or Organizations: No    Attends Banker Meetings: Not on file    Marital Status: Never married  Intimate Partner Violence: Not on file    Lab Results  Component Value Date   HGBA1C 8.1 (H) 12/19/2022   HGBA1C 9.1 (A) 07/15/2022   HGBA1C 6.0 04/01/2021   Lab Results  Component Value Date   CHOL 119 12/19/2022   Lab Results  Component Value Date   HDL 33 (L) 12/19/2022   Lab Results  Component Value Date   LDLCALC 61 12/19/2022   Lab Results  Component Value Date   TRIG 186 (H) 12/19/2022   Lab Results  Component Value Date   CHOLHDL 3.6 12/19/2022   Lab Results  Component Value Date   CREATININE 0.73 12/19/2022   Lab Results  Component Value Date   GFR 115.39 04/01/2021   Lab Results  Component Value Date   MICROALBUR 21.2 12/19/2022      Component Value Date/Time   NA 142 12/19/2022 0927   NA 142 03/08/2021 1609   K 3.7 12/19/2022 0927   CL 103 12/19/2022 0927   CO2 30 12/19/2022 0927   GLUCOSE 177 (H) 12/19/2022 0927   BUN 12 12/19/2022 0927   BUN 17 03/08/2021 1609   CREATININE 0.73 12/19/2022 0927   CALCIUM 8.8 12/19/2022 0927   PROT 6.5 12/19/2022 0927   ALBUMIN 4.1 04/01/2021 1410   AST 54 (H) 12/19/2022 0927   ALT 38 (H) 12/19/2022 0927   ALKPHOS 45 04/01/2021  1410   BILITOT 0.4 12/19/2022 0927   GFRNONAA >60 06/07/2020 1746   GFRAA >60 07/15/2019 1402      Latest Ref Rng & Units 12/19/2022    9:27 AM 04/01/2021    2:10 PM 03/08/2021    4:09 PM  BMP  Glucose 65 - 99 mg/dL 811  914  85   BUN 7 - 25 mg/dL 12  17  17    Creatinine  0.50 - 0.97 mg/dL 5.62  1.30  8.65   BUN/Creat Ratio 6 - 22 (calc) SEE NOTE:   27   Sodium 135 - 146 mmol/L 142  138  142   Potassium 3.5 - 5.3 mmol/L 3.7  3.7  4.4   Chloride 98 - 110 mmol/L 103  103  101   CO2 20 - 32 mmol/L 30  26  25    Calcium 8.6 - 10.2 mg/dL 8.8  9.2  9.6        Component Value Date/Time   WBC 7.0 04/01/2021 1410   RBC 4.14 04/01/2021 1410   HGB 12.4 04/01/2021 1410   HGB 10.5 (L) 04/02/2020 1648   HCT 36.2 04/01/2021 1410   HCT 30.4 (L) 04/02/2020 1648   PLT 176.0 04/01/2021 1410   PLT 191 04/02/2020 1648   MCV 87.5 04/01/2021 1410   MCV 92 04/02/2020 1648   MCH 31.8 06/07/2020 1746   MCHC 34.3 04/01/2021 1410   RDW 14.2 04/01/2021 1410   RDW 13.0 04/02/2020 1648   LYMPHSABS 1.4 04/01/2021 1410   LYMPHSABS 1.2 04/02/2020 1648   MONOABS 0.4 04/01/2021 1410   EOSABS 0.1 04/01/2021 1410   EOSABS 0.1 04/02/2020 1648   BASOSABS 0.0 04/01/2021 1410   BASOSABS 0.0 04/02/2020 1648     Parts of this note may have been dictated using voice recognition software. There may be variances in spelling and vocabulary which are unintentional. Not all errors are proofread. Please notify the [facility] Parkin if any discrepancies are noted or if the meaning of any statement is not clear.

## 2022-12-22 NOTE — Patient Instructions (Signed)
Medication Instructions:  Your physician has recommended you make the following change in your medication:  STOP: Propranolol INCREASE: Valsartan 80 mg twice daily   Please call the office to schedule an appointment with our pharmacy staff for 6 weeks from now.  *If you need a refill on your cardiac medications before your next appointment, please call your pharmacy*    Follow-Up: At [facility], you and your health needs are our priority.  As part of our continuing mission to provide you with exceptional heart care, we have created designated Provider Care Teams.  These Care Teams include your primary Cardiologist (physician) and Advanced Practice Providers (APPs -  Physician Assistants and Nurse Practitioners) who all work together to provide you with the care you need, when you need it.  Your next appointment:   12 week(s)  Provider:   Thomasene Ripple, DO     Other Instructions Please see our pharmacy staff in 6 weeks. Call the office at [phone] to make an appointment.

## 2022-12-22 NOTE — Progress Notes (Signed)
Virtual Visit via Video Note   Because of Katie [name]'s co-morbid illnesses, she is at least at moderate risk for complications without adequate follow up.  This format is felt to be most appropriate for this patient at this time.  All issues noted in this document were discussed and addressed.  A limited physical exam was performed with this format.  Please refer to the patient's chart for her consent to telehealth for [facility].  Video Connection Lost Video connection was lost at < 50% of the duration of this visit, at which time the remainder of the visit was completed via audio only.    Date:  12/22/2022   ID:  Katie [name], DOB 1985/08/10, MRN [id number]  Patient Location: Home Provider Location: Office/Clinic  Virtual Visit via Video  Note . I connected with the patient today by a   video enabled telemedicine application and verified that I am speaking with the correct person using two identifiers.  PCP:  Karie Georges, MD  Cardiologist:  Thomasene Ripple, DO  Electrophysiologist:  None   Evaluation Performed:  Follow-Up Visit  Chief Complaint:  " I am ok"  History of Present Illness:    Katie [name] is a 37 y.o. 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  Her last visit with me was in July 2024, at that time her blood pressure at target therefore no medication changes were made.  She is here today for follow-up visit virtually because her blood pressure has been elevated at home.  The patient does not have symptoms concerning for COVID-19 infection (fever, chills, cough, or new shortness of breath).    Past Medical History:  Diagnosis Date   ADHD (attention deficit hyperactivity disorder) 03/01/2011   Anxiety    Arthritis    neck    Asthma    as a child   Borderline personality disorder (HCC)  05/26/2019   Bronchospasm 01/19/2014   Formatting of this note might be different from the original. Prn albuterol, WARI   Chronic hypertension in pregnancy 06/05/2020   Chronic neck pain 04/04/2019   Complex atypical endometrial hyperplasia 02/24/2019   Depression    Fibromyalgia    Hepatic steatosis    Hypertension    Hypertension during pregnancy in third trimester 04/02/2020   IBS (irritable bowel syndrome)    Newly diagnosed diabetes (HCC) 03/09/2019   Obesity    Panic attack    Torticollis 01/19/2014   Valium overdose    Past Surgical History:  Procedure Laterality Date   big toes ingrown toenail removed     CESAREAN SECTION N/A 05/30/2020   Procedure: CESAREAN SECTION;  Surgeon: Shea Evans, MD;  Location: [facility];  Service: Obstetrics;  Laterality: N/A;   DILATATION & CURETTAGE/HYSTEROSCOPY WITH MYOSURE N/A 02/24/2019   Procedure: DILATATION & CURETTAGE/HYSTEROSCOPY WITH MYOSURE;  Surgeon: Shea Evans, MD;  Location: [facility];  Service: Gynecology;  Laterality: N/A;   MOUTH SURGERY     "baby teeth removed,adult teeth  pulled down"   RADIOLOGY WITH ANESTHESIA N/A 06/07/2019   Procedure: MRI CERVICAL SPINE WITHOUT CONTRAST;  Surgeon: Radiologist, Medication, MD;  Location: [facility] OR;  Service: Radiology;  Laterality: N/A;   wisdom teeth       Current Meds  Medication Sig   acetaminophen (TYLENOL) 500 MG tablet Take 2 tablets (1,000 mg total) by mouth every 6 (  six) hours. (Patient taking differently: Take 1,000 mg by mouth as needed.)   albuterol (VENTOLIN HFA) 108 (90 Base) MCG/ACT inhaler Inhale 2 puffs into the lungs every 6 (six) hours as needed for wheezing or shortness of breath.   atorvastatin (LIPITOR) 10 MG tablet Take 1 tablet (10 mg total) by mouth daily.   benzonatate (TESSALON) 100 MG capsule Take 1 capsule (100 mg total) by mouth 3 (three) times daily as needed for cough.   blood glucose meter kit and supplies KIT Use as directed twice a day   Blood  Glucose Monitoring Suppl (CONTOUR NEXT ONE) KIT Use as directed   Blood Pressure Monitoring (BLOOD PRESSURE MONITOR AUTOMAT) DEVI 1 Units by Does not apply route once for 1 dose.   brompheniramine-pseudoephedrine-DM 30-2-10 MG/5ML syrup Take 5 mLs by mouth 4 (four) times daily as needed.   busPIRone (BUSPAR) 7.5 MG tablet Take 1 tablet (7.5 mg total) by mouth 3 (three) times daily.   cetirizine (ZYRTEC) 10 MG tablet Take 10 mg by mouth daily.   Continuous Glucose Sensor (DEXCOM G7 SENSOR) MISC Use 1 Device continuous.   desvenlafaxine (PRISTIQ) 50 MG 24 hr tablet Take 1 tablet (50 mg total) by mouth daily.   famotidine (PEPCID) 20 MG tablet TAKE 1 TABLET BY MOUTH EVERY MORNING AND TAKE 1 TABLET BY MOUTH EVERY NIGHT AT BEDTIME   Ferrous Sulfate (IRON PO) Take by mouth daily.   fluticasone (FLONASE) 50 MCG/ACT nasal spray Place 2 sprays into both nostrils daily.   furosemide (LASIX) 40 MG tablet Take 1 tablet (40 mg total) by mouth every other day.   gabapentin (NEURONTIN) 100 MG capsule Take 2 capsules (200 mg total) by mouth at bedtime.   glucose blood (CONTOUR NEXT TEST) test strip Check 3 times a day   hydrochlorothiazide (HYDRODIURIL) 25 MG tablet Take 1 tablet (25 mg total) by mouth daily.   hydrOXYzine (ATARAX) 25 MG tablet Take 1 tablet (25 mg total) by mouth 3 (three) times daily as needed for anxiety.   ibuprofen (ADVIL) 800 MG tablet Take 1 tablet (800 mg total) by mouth every 8 (eight) hours as needed for moderate pain.   ipratropium (ATROVENT) 0.03 % nasal spray Place 2 sprays into both nostrils every 12 (twelve) hours.   magic mouthwash w/lidocaine SOLN Take 5 mLs by mouth 3 (three) times daily as needed.   metFORMIN (GLUCOPHAGE-XR) 750 MG 24 hr tablet Take 1 tablet (750 mg total) by mouth in the morning and at bedtime.   metoCLOPramide (REGLAN) 10 MG tablet Take 1 tablet (10 mg total) by mouth 3 (three) times daily before meals.   montelukast (SINGULAIR) 10 MG tablet Take 1 tablet  (10 mg total) by mouth at bedtime.   naproxen (NAPROSYN) 500 MG tablet TAKE 1 TABLET BY MOUTH 2 TIMES A DAY AS NEEDED FOR MODERATE PAIN   omeprazole (PRILOSEC) 40 MG capsule Take 1 capsule (40 mg total) by mouth daily.   ondansetron (ZOFRAN) 4 MG tablet Take 1 tablet (4 mg total) by mouth every 8 (eight) hours as needed for nausea or vomiting.   predniSONE (DELTASONE) 20 MG tablet Take 2 tablets (40 mg total) by mouth daily with breakfast.   propranolol (INDERAL) 10 MG tablet Take 1 tablet (10 mg total) by mouth daily.   tirzepatide (MOUNJARO) 7.5 MG/0.5ML Pen Inject 7.5 mg into the skin once a week.   tiZANidine (ZANAFLEX) 2 MG tablet Take 1 tablet (2 mg total) by mouth 3 (three) times daily as needed  for muscle spasms.   venlafaxine XR (EFFEXOR-XR) 150 MG 24 hr capsule Take 150 mg by mouth daily with breakfast.   Vitamin D, Ergocalciferol, (DRISDOL) 1.25 MG (50000 UNIT) CAPS capsule TAKE ONE CAPSULE BY MOUTH ONCE WEEKLY   [DISCONTINUED] valsartan (DIOVAN) 80 MG tablet Take 1 tablet (80 mg total) by mouth daily.     Allergies:   Flexeril [cyclobenzaprine hcl] and Lorazepam   Social History   Tobacco Use   Smoking status: Former    Current packs/day: 0.00    Average packs/day: 0.5 packs/day for 4.0 years (2.0 ttl pk-yrs)    Types: Cigarettes    Start date: 01/13/2001    Quit date: 01/13/2005    Years since quitting: 17.9   Smokeless tobacco: Never  Vaping Use   Vaping status: Never Used  Substance Use Topics   Alcohol use: Not Currently   Drug use: No     Family Hx: The patient's family history includes Anxiety disorder in her father; Asthma in her daughter; COPD in her paternal grandmother; Depression in her father and mother; Hearing loss in her father; Heart attack in her paternal grandmother; Heart disease in her maternal grandfather and mother; Heart failure in her paternal grandmother; High blood pressure in her mother; Kidney disease in her mother; Learning disabilities in her  daughter; Lung cancer in her paternal grandfather; Multiple sclerosis in her maternal grandmother; Other in her brother; Psychosis in her mother; Stroke in her paternal grandmother.  ROS:   Please see the history of present illness.     All other systems reviewed and are negative.   Prior CV studies:   The following studies were reviewed today:   Labs/Other Tests and Data Reviewed:    EKG:  No ECG reviewed.  Recent Labs: 12/19/2022: ALT 38; BUN 12; Creat 0.73; Potassium 3.7; Sodium 142   Recent Lipid Panel Lab Results  Component Value Date/Time   CHOL 119 12/19/2022 09:27 AM   TRIG 186 (H) 12/19/2022 09:27 AM   HDL 33 (L) 12/19/2022 09:27 AM   CHOLHDL 3.6 12/19/2022 09:27 AM   LDLCALC 61 12/19/2022 09:27 AM   LDLDIRECT 89.0 10/03/2020 03:17 PM    Wt Readings from Last 3 Encounters:  12/22/22 256 lb (116.1 kg)  12/22/22 256 lb 9.6 oz (116.4 kg)  10/06/22 266 lb 12.8 oz (121 kg)     Objective:    Vital Signs:  BP 120/84 Comment (BP Location): unknown not communicated  Pulse (!) 110   Ht 5\' 6"  (1.676 m)   Wt 256 lb (116.1 kg)   SpO2 98%   BMI 41.32 kg/m      ASSESSMENT & PLAN:    Hypertension - Blood pressure uncontrolled on current regimen of Valsartan 80mg  daily, Hydrochlorothiazide, Furosemide, and Propranolol (initiated during pregnancy). -Increase Valsartan to 80mg  twice daily. -Discontinue Propranolol. -Obtain a verified home blood pressure monitor.  Diabetes Mellitus -Uncontrolled blood sugars reported by endocrinologist, increasing risk for retinopathy. Endocrinologist to manage.  Follow-up -Pharmacist visit in 6 weeks. -Return visit in 12 weeks.   COVID-19 Education: The signs and symptoms of COVID-19 were discussed with the patient and how to seek care for testing (follow up with PCP or arrange E-visit).  The importance of social distancing was discussed today.  Time:   Today, I have spent 12 minutes with the patient with telehealth technology  discussing the above problems.     Medication Adjustments/Labs and Tests Ordered: Current medicines are reviewed at length with the patient today.  Concerns regarding medicines are outlined above.   Tests Ordered: No orders of the defined types were placed in this encounter.   Medication Changes: Meds ordered this encounter  Medications   Blood Pressure Monitoring (BLOOD PRESSURE MONITOR AUTOMAT) DEVI    Sig: 1 Units by Does not apply route once for 1 dose.    Dispense:  1 each    Refill:  0   valsartan (DIOVAN) 80 MG tablet    Sig: Take 1 tablet (80 mg total) by mouth 2 (two) times daily.    Dispense:  180 tablet    Refill:  3    Follow Up:  In Person prn  Signed, Thomasene Ripple, DO  12/22/2022 5:20 PM    [facility]

## 2022-12-24 ENCOUNTER — Other Ambulatory Visit (HOSPITAL_BASED_OUTPATIENT_CLINIC_OR_DEPARTMENT_OTHER): Payer: Self-pay

## 2022-12-24 ENCOUNTER — Telehealth: Payer: Self-pay

## 2022-12-24 ENCOUNTER — Other Ambulatory Visit (HOSPITAL_COMMUNITY): Payer: Self-pay

## 2022-12-24 ENCOUNTER — Encounter: Payer: Self-pay | Admitting: "Endocrinology

## 2022-12-24 ENCOUNTER — Encounter (HOSPITAL_BASED_OUTPATIENT_CLINIC_OR_DEPARTMENT_OTHER): Payer: Self-pay

## 2022-12-24 ENCOUNTER — Encounter: Payer: Self-pay | Admitting: Cardiology

## 2022-12-24 NOTE — Telephone Encounter (Signed)
Pharmacy Patient Advocate Encounter   Received notification from Pt Calls Messages that prior authorization for Dexcom G7 sensor is required/requested.   Insurance verification completed.   The patient is insured through [facility] .   Per test claim: PA required; PA submitted to above mentioned insurance via CoverMyMeds Key/confirmation #/EOC [facility] Status is pending   Please be advised that this may result in a denial since the pt is not using insulin and does not have any documented severe episodes of hypoglycemia.

## 2022-12-24 NOTE — Telephone Encounter (Signed)
  My insurance requires a pre-authorization for the Dexcom device and sensors. The pharmacy said they sent it to the office, but they are waiting to receive it back.

## 2022-12-25 ENCOUNTER — Other Ambulatory Visit (HOSPITAL_BASED_OUTPATIENT_CLINIC_OR_DEPARTMENT_OTHER): Payer: Self-pay

## 2022-12-25 ENCOUNTER — Other Ambulatory Visit: Payer: Self-pay

## 2022-12-26 ENCOUNTER — Other Ambulatory Visit (HOSPITAL_BASED_OUTPATIENT_CLINIC_OR_DEPARTMENT_OTHER): Payer: Self-pay

## 2022-12-26 ENCOUNTER — Encounter: Payer: Self-pay | Admitting: "Endocrinology

## 2022-12-26 NOTE — Telephone Encounter (Signed)
Pharmacy Patient Advocate Encounter  Received notification from [facility] that Prior Authorization for Dexcom G7 sensor has been DENIED.  Full denial letter will be uploaded to the media tab. See denial reason below.

## 2022-12-30 ENCOUNTER — Telehealth: Payer: BC Managed Care – PPO | Admitting: Physician Assistant

## 2022-12-30 ENCOUNTER — Other Ambulatory Visit (HOSPITAL_BASED_OUTPATIENT_CLINIC_OR_DEPARTMENT_OTHER): Payer: Self-pay

## 2022-12-30 DIAGNOSIS — A084 Viral intestinal infection, unspecified: Secondary | ICD-10-CM | POA: Diagnosis not present

## 2022-12-30 MED ORDER — ONDANSETRON 4 MG PO TBDP
4.0000 mg | ORAL_TABLET | Freq: Three times a day (TID) | ORAL | 0 refills | Status: DC | PRN
  Filled 2022-12-30: qty 20, 7d supply, fill #0

## 2022-12-30 NOTE — Progress Notes (Signed)
I have spent 5 minutes in review of e-visit questionnaire, review and updating patient chart, medical decision making and response to patient.   Mia Milan Cody Jacklynn Dehaas, PA-C    

## 2022-12-30 NOTE — Progress Notes (Signed)

## 2023-01-01 ENCOUNTER — Other Ambulatory Visit (HOSPITAL_BASED_OUTPATIENT_CLINIC_OR_DEPARTMENT_OTHER): Payer: Self-pay

## 2023-01-02 ENCOUNTER — Other Ambulatory Visit (HOSPITAL_BASED_OUTPATIENT_CLINIC_OR_DEPARTMENT_OTHER): Payer: Self-pay

## 2023-01-12 ENCOUNTER — Ambulatory Visit: Payer: BC Managed Care – PPO | Admitting: "Endocrinology

## 2023-01-12 ENCOUNTER — Encounter: Payer: Self-pay | Admitting: "Endocrinology

## 2023-01-13 ENCOUNTER — Ambulatory Visit: Payer: BC Managed Care – PPO | Admitting: "Endocrinology

## 2023-01-13 ENCOUNTER — Other Ambulatory Visit (HOSPITAL_BASED_OUTPATIENT_CLINIC_OR_DEPARTMENT_OTHER): Payer: Self-pay

## 2023-01-13 NOTE — Telephone Encounter (Signed)
LMx1 to call office to reschedule appointment.

## 2023-01-16 ENCOUNTER — Ambulatory Visit: Payer: BC Managed Care – PPO | Admitting: Family Medicine

## 2023-01-16 ENCOUNTER — Other Ambulatory Visit (HOSPITAL_BASED_OUTPATIENT_CLINIC_OR_DEPARTMENT_OTHER): Payer: Self-pay

## 2023-01-18 ENCOUNTER — Other Ambulatory Visit: Payer: Self-pay | Admitting: Family Medicine

## 2023-01-18 DIAGNOSIS — E119 Type 2 diabetes mellitus without complications: Secondary | ICD-10-CM

## 2023-01-30 ENCOUNTER — Other Ambulatory Visit (HOSPITAL_BASED_OUTPATIENT_CLINIC_OR_DEPARTMENT_OTHER): Payer: Self-pay

## 2023-01-30 ENCOUNTER — Telehealth: Payer: BC Managed Care – PPO | Admitting: Family Medicine

## 2023-01-30 DIAGNOSIS — J4521 Mild intermittent asthma with (acute) exacerbation: Secondary | ICD-10-CM | POA: Diagnosis not present

## 2023-01-30 MED ORDER — ALBUTEROL SULFATE HFA 108 (90 BASE) MCG/ACT IN AERS
1.0000 | INHALATION_SPRAY | Freq: Four times a day (QID) | RESPIRATORY_TRACT | 0 refills | Status: DC | PRN
  Filled 2023-01-30: qty 6.7, 25d supply, fill #0

## 2023-01-30 NOTE — Progress Notes (Signed)
E-Visit for Cough   We are sorry that you are not feeling well.  Here is how we plan to help!  Based on your presentation I believe you most likely have A cough due to a virus.  This is called viral bronchitis and is best treated by rest, plenty of fluids and control of the cough.  You may use Ibuprofen or Tylenol as directed to help your symptoms.     In addition you may use A prescription cough medication called Tessalon Perles 100mg . You may take 1-2 capsules every 8 hours as needed for your cough.  From your responses in the eVisit questionnaire you describe inflammation in the upper respiratory tract which is causing a significant cough.  This is commonly called Bronchitis and has four common causes:   Allergies Viral Infections Acid Reflux Bacterial Infection Allergies, viruses and acid reflux are treated by controlling symptoms or eliminating the cause. An example might be a cough caused by taking certain blood pressure medications. You stop the cough by changing the medication. Another example might be a cough caused by acid reflux. Controlling the reflux helps control the cough.  I will order an inhaler for you.  USE OF BRONCHODILATOR ("RESCUE") INHALERS: There is a risk from using your bronchodilator too frequently.  The risk is that over-reliance on a medication which only relaxes the muscles surrounding the breathing tubes can reduce the effectiveness of medications prescribed to reduce swelling and congestion of the tubes themselves.  Although you feel brief relief from the bronchodilator inhaler, your asthma may actually be worsening with the tubes becoming more swollen and filled with mucus.  This can delay other crucial treatments, such as oral steroid medications. If you need to use a bronchodilator inhaler daily, several times per day, you should discuss this with your provider.  There are probably better treatments that could be used to keep your asthma under control.     HOME  CARE Only take medications as instructed by your medical team. Complete the entire course of an antibiotic. Drink plenty of fluids and get plenty of rest. Avoid close contacts especially the very young and the elderly Cover your mouth if you cough or cough into your sleeve. Always remember to wash your hands A steam or ultrasonic humidifier can help congestion.   GET HELP RIGHT AWAY IF: You develop worsening fever. You become short of breath You cough up blood. Your symptoms persist after you have completed your treatment plan MAKE SURE YOU  Understand these instructions. Will watch your condition. Will get help right away if you are not doing well or get worse.    Thank you for choosing an e-visit.  Your e-visit answers were reviewed by a board certified advanced clinical practitioner to complete your personal care plan. Depending upon the condition, your plan could have included both over the counter or prescription medications.  Please review your pharmacy choice. Make sure the pharmacy is open so you can pick up prescription now. If there is a problem, you may contact your provider through [employer] and have the prescription routed to another pharmacy.  Your safety is important to [place]. If you have drug allergies check your prescription carefully.   For the next 24 hours you can use MyChart to ask questions about today's visit, request a non-urgent call back, or ask for a work or school excuse. You will get an email in the next two days asking about your experience. I hope that your e-visit has  been valuable and will speed your recovery.   I provided 5 minutes of non face-to-face time during this encounter for chart review, medication and order placement, as well as and documentation.

## 2023-02-01 ENCOUNTER — Telehealth: Payer: BC Managed Care – PPO | Admitting: Physician Assistant

## 2023-02-01 DIAGNOSIS — R051 Acute cough: Secondary | ICD-10-CM

## 2023-02-01 MED ORDER — BENZONATATE 100 MG PO CAPS
100.0000 mg | ORAL_CAPSULE | Freq: Three times a day (TID) | ORAL | 0 refills | Status: DC | PRN
  Filled 2023-02-01: qty 20, 4d supply, fill #0

## 2023-02-01 NOTE — Progress Notes (Signed)
I reviewed the last e-visit that you sent, I did send over the prescription for Tessalon Perles that were missing. I apologize for the inconvenience. You will not be charged for this e-visit. I hope that you feel better soon

## 2023-02-02 ENCOUNTER — Other Ambulatory Visit (HOSPITAL_BASED_OUTPATIENT_CLINIC_OR_DEPARTMENT_OTHER): Payer: Self-pay

## 2023-02-05 ENCOUNTER — Other Ambulatory Visit (HOSPITAL_BASED_OUTPATIENT_CLINIC_OR_DEPARTMENT_OTHER): Payer: Self-pay

## 2023-02-05 ENCOUNTER — Telehealth: Payer: BC Managed Care – PPO | Admitting: Physician Assistant

## 2023-02-05 DIAGNOSIS — B9689 Other specified bacterial agents as the cause of diseases classified elsewhere: Secondary | ICD-10-CM

## 2023-02-05 DIAGNOSIS — J4541 Moderate persistent asthma with (acute) exacerbation: Secondary | ICD-10-CM

## 2023-02-05 MED ORDER — DOXYCYCLINE HYCLATE 100 MG PO TABS
100.0000 mg | ORAL_TABLET | Freq: Two times a day (BID) | ORAL | 0 refills | Status: DC
  Filled 2023-02-05: qty 14, 7d supply, fill #0

## 2023-02-05 MED ORDER — PREDNISONE 20 MG PO TABS
40.0000 mg | ORAL_TABLET | Freq: Every day | ORAL | 0 refills | Status: DC
  Filled 2023-02-05: qty 10, 5d supply, fill #0

## 2023-02-05 NOTE — Progress Notes (Signed)
I have spent 5 minutes in review of e-visit questionnaire, review and updating patient chart, medical decision making and response to patient.   Mia Milan Cody Jacklynn Dehaas, PA-C    

## 2023-02-05 NOTE — Progress Notes (Signed)
E-Visit for Cough   We are sorry that you are not feeling well.  Here is how we plan to help!  Based on your presentation I believe you most likely have A cough due to bacteria and an exacerbation of your asthma.  When patients have a fever and a productive cough with a change in color or increased sputum production, we are concerned about bacterial bronchitis.  If left untreated it can progress to pneumonia.  If your symptoms do not improve with your treatment plan it is important that you contact your provider.   I have prescribed Doxycycline 100 mg twice a day for 7 days     I have also sent in a 5-day burst of prednisone to take as directed.  From your responses in the eVisit questionnaire you describe inflammation in the upper respiratory tract which is causing a significant cough.  This is commonly called Bronchitis and has four common causes:   Allergies Viral Infections Acid Reflux Bacterial Infection Allergies, viruses and acid reflux are treated by controlling symptoms or eliminating the cause. An example might be a cough caused by taking certain blood pressure medications. You stop the cough by changing the medication. Another example might be a cough caused by acid reflux. Controlling the reflux helps control the cough.  USE OF BRONCHODILATOR ("RESCUE") INHALERS: There is a risk from using your bronchodilator too frequently.  The risk is that over-reliance on a medication which only relaxes the muscles surrounding the breathing tubes can reduce the effectiveness of medications prescribed to reduce swelling and congestion of the tubes themselves.  Although you feel brief relief from the bronchodilator inhaler, your asthma may actually be worsening with the tubes becoming more swollen and filled with mucus.  This can delay other crucial treatments, such as oral steroid medications. If you need to use a bronchodilator inhaler daily, several times per day, you should discuss this with your  provider.  There are probably better treatments that could be used to keep your asthma under control.     HOME CARE Only take medications as instructed by your medical team. Complete the entire course of an antibiotic. Drink plenty of fluids and get plenty of rest. Avoid close contacts especially the very young and the elderly Cover your mouth if you cough or cough into your sleeve. Always remember to wash your hands A steam or ultrasonic humidifier can help congestion.   GET HELP RIGHT AWAY IF: You develop worsening fever. You become short of breath You cough up blood. Your symptoms persist after you have completed your treatment plan MAKE SURE YOU  Understand these instructions. Will watch your condition. Will get help right away if you are not doing well or get worse.    Thank you for choosing an e-visit.  Your e-visit answers were reviewed by a board certified advanced clinical practitioner to complete your personal care plan. Depending upon the condition, your plan could have included both over the counter or prescription medications.  Please review your pharmacy choice. Make sure the pharmacy is open so you can pick up prescription now. If there is a problem, you may contact your provider through [employer] and have the prescription routed to another pharmacy.  Your safety is important to [place]. If you have drug allergies check your prescription carefully.   For the next 24 hours you can use MyChart to ask questions about today's visit, request a non-urgent call back, or ask for a work or school excuse. You  will get an email in the next two days asking about your experience. I hope that your e-visit has been valuable and will speed your recovery.

## 2023-02-09 ENCOUNTER — Other Ambulatory Visit: Payer: Self-pay | Admitting: "Endocrinology

## 2023-02-09 DIAGNOSIS — E1165 Type 2 diabetes mellitus with hyperglycemia: Secondary | ICD-10-CM

## 2023-02-10 ENCOUNTER — Other Ambulatory Visit (HOSPITAL_BASED_OUTPATIENT_CLINIC_OR_DEPARTMENT_OTHER): Payer: Self-pay

## 2023-02-10 MED ORDER — MOUNJARO 7.5 MG/0.5ML ~~LOC~~ SOAJ
7.5000 mg | SUBCUTANEOUS | 6 refills | Status: DC
  Filled 2023-02-10: qty 2, 28d supply, fill #0

## 2023-02-12 ENCOUNTER — Other Ambulatory Visit (HOSPITAL_BASED_OUTPATIENT_CLINIC_OR_DEPARTMENT_OTHER): Payer: Self-pay

## 2023-02-12 ENCOUNTER — Encounter: Payer: Self-pay | Admitting: "Endocrinology

## 2023-02-12 ENCOUNTER — Ambulatory Visit: Payer: BC Managed Care – PPO | Admitting: "Endocrinology

## 2023-02-12 VITALS — BP 122/82 | HR 103 | Wt 251.0 lb

## 2023-02-12 DIAGNOSIS — Z7984 Long term (current) use of oral hypoglycemic drugs: Secondary | ICD-10-CM

## 2023-02-12 DIAGNOSIS — Z7985 Long-term (current) use of injectable non-insulin antidiabetic drugs: Secondary | ICD-10-CM

## 2023-02-12 DIAGNOSIS — E1165 Type 2 diabetes mellitus with hyperglycemia: Secondary | ICD-10-CM | POA: Diagnosis not present

## 2023-02-12 DIAGNOSIS — E782 Mixed hyperlipidemia: Secondary | ICD-10-CM | POA: Diagnosis not present

## 2023-02-12 MED ORDER — TIRZEPATIDE 10 MG/0.5ML ~~LOC~~ SOAJ
10.0000 mg | SUBCUTANEOUS | 0 refills | Status: DC
  Filled 2023-02-12: qty 2, 28d supply, fill #0

## 2023-02-12 NOTE — Progress Notes (Signed)
Outpatient Endocrinology Note [name], MD  02/12/23   Katie [name] August 08, 1985 [id number]  Referring Provider: Karie Georges, MD Primary Care Provider: Karie Georges, MD Reason for consultation: Subjective   Assessment & Plan  Diagnoses and all orders for this visit:  Uncontrolled type 2 diabetes mellitus with hyperglycemia (HCC)  Long term (current) use of oral hypoglycemic drugs  Long-term (current) use of injectable non-insulin antidiabetic drugs  Mixed hypercholesterolemia and hypertriglyceridemia  Other orders -     tirzepatide [facility]) 10 MG/0.5ML Pen; Inject 10 mg into the skin once a week.   Diabetes Type II complicated by neuropathy  Lab Results  Component Value Date   GFR 115.39 04/01/2021   Hba1c goal less than 7, current Hba1c is  Lab Results  Component Value Date   HGBA1C 8.1 (H) 12/19/2022   Will recommend the following: Metformin XR 750 mg bid-sometimes causes GI effects  Mounjaro 10 mg every day  DexCom not covered by insurance  Ordered DM education previously   No known contraindications/side effects to any of above medications No history of MEN syndrome/medullary thyroid cancer/pancreatitis or pancreatic cancer in self or family  -Last LD and Tg are as follows: Lab Results  Component Value Date   LDLCALC 61 12/19/2022    Lab Results  Component Value Date   TRIG 186 (H) 12/19/2022   -On lipitor 10 mg QD -Follow low fat diet and exercise   -Blood pressure goal <140/90 - Microalbumin/creatinine goal is < 30 -Last MA/Cr is as follows: Lab Results  Component Value Date   MICROALBUR 21.2 12/19/2022   -on ACE/ARB valsartan 80 mg qd -diet changes including salt restriction -limit eating outside -counseled BP targets per standards of diabetes care -uncontrolled blood pressure can lead to retinopathy, nephropathy and cardiovascular and atherosclerotic heart disease  Reviewed and counseled on: -A1C  target -Blood sugar targets -Complications of uncontrolled diabetes  -Checking blood sugar before meals and bedtime and bring log next visit -All medications with mechanism of action and side effects -Hypoglycemia management: rule of 15's, Glucagon Emergency Kit and medical alert ID -low-carb low-fat plate-method diet -At least 20 minutes of physical activity per day -Annual dilated retinal eye exam and foot exam -compliance and follow up needs -follow up as scheduled or earlier if problem gets worse  Call if blood sugar is less than 70 or consistently above 250    Take a 15 gm snack of carbohydrate at bedtime before you go to sleep if your blood sugar is less than 100.    If you are going to fast after midnight for a test or procedure, ask your physician for instructions on how to reduce/decrease your insulin dose.    Call if blood sugar is less than 70 or consistently above 250  -Treating a low sugar by rule of 15  (15 gms of sugar every 15 min until sugar is more than 70) If you feel your sugar is low, test your sugar to be sure If your sugar is low (less than 70), then take 15 grams of a fast acting Carbohydrate (3-4 glucose tablets or glucose gel or 4 ounces of juice or regular soda) Recheck your sugar 15 min after treating low to make sure it is more than 70 If sugar is still less than 70, treat again with 15 grams of carbohydrate          Don't drive the hour of hypoglycemia  If unconscious/unable to eat or drink by  mouth, use glucagon injection or nasal spray baqsimi and call 911. Can repeat again in 15 min if still unconscious.  Return in about 4 weeks (around 03/12/2023).   I have reviewed current medications, nurse's notes, allergies, vital signs, past medical and surgical history, family medical history, and social history for this encounter. Counseled patient on symptoms, examination findings, lab findings, imaging results, treatment decisions and monitoring and prognosis.  The patient understood the recommendations and agrees with the treatment plan. All questions regarding treatment plan were fully answered.  Altamese , MD  02/12/23    History of Present Illness Katie [name] is a 38 y.o. year old female who presents for evaluation of Type II diabetes mellitus.   Katie [name] was first diagnosed in 2021 (had gestational diabetes 2007).   Diabetes education +  Home diabetes regimen: Metformin XR 750 mg bid Mounjaro 7.5 mg every day   COMPLICATIONS -  MI/Stroke -  retinopathy +  neuropathy -  nephropathy  SYMPTOMS REVIEWED - Polyuria - Weight loss - Blurred vision  BLOOD SUGAR DATA Checking BG 0-1/day 110-274 fasting   Physical Exam  BP 122/82   Pulse (!) 103   Wt 251 lb (113.9 kg)   SpO2 96%   BMI 40.51 kg/m    Constitutional: well developed, well nourished Head: normocephalic, atraumatic Eyes: sclera anicteric, no redness Neck: supple Lungs: normal respiratory effort Neurology: alert and oriented Skin: dry, no appreciable rashes Musculoskeletal: no appreciable defects Psychiatric: normal mood and affect Diabetic Foot Exam - Simple   No data filed      Current Medications Patient's Medications  New Prescriptions   TIRZEPATIDE (MOUNJARO) 10 MG/0.5ML PEN    Inject 10 mg into the skin once a week.  Previous Medications   ACETAMINOPHEN (TYLENOL) 500 MG TABLET    Take 2 tablets (1,000 mg total) by mouth every 6 (six) hours.   ALBUTEROL (VENTOLIN HFA) 108 (90 BASE) MCG/ACT INHALER    Inhale 1-2 puffs into the lungs every 6 (six) hours as needed for wheezing or shortness of breath (cough).   ATORVASTATIN (LIPITOR) 10 MG TABLET    Take 1 tablet (10 mg total) by mouth daily.   BENZONATATE (TESSALON) 100 MG CAPSULE    Take 1-2 capsules (100-200 mg total) by mouth 3 (three) times daily as needed.   BLOOD GLUCOSE METER KIT AND SUPPLIES KIT    Use as directed twice a day   BLOOD GLUCOSE MONITORING SUPPL  (CONTOUR NEXT ONE) KIT    Use as directed   BROMPHENIRAMINE-PSEUDOEPHEDRINE-DM 30-2-10 MG/5ML SYRUP    Take 5 mLs by mouth 4 (four) times daily as needed.   BUSPIRONE (BUSPAR) 7.5 MG TABLET    Take 1 tablet (7.5 mg total) by mouth 3 (three) times daily.   CETIRIZINE (ZYRTEC) 10 MG TABLET    Take 10 mg by mouth daily.   CONTINUOUS GLUCOSE SENSOR (DEXCOM G7 SENSOR) MISC    Use 1 Device continuous.   DESVENLAFAXINE (PRISTIQ) 50 MG 24 HR TABLET    Take 1 tablet (50 mg total) by mouth daily.   DOXYCYCLINE (VIBRA-TABS) 100 MG TABLET    Take 1 tablet (100 mg total) by mouth 2 (two) times daily.   FAMOTIDINE (PEPCID) 20 MG TABLET    TAKE 1 TABLET BY MOUTH EVERY MORNING AND TAKE 1 TABLET BY MOUTH EVERY NIGHT AT BEDTIME   FERROUS SULFATE (IRON PO)    Take by mouth daily.   FLUTICASONE (FLONASE) 50 MCG/ACT NASAL SPRAY  Place 2 sprays into both nostrils daily.   FUROSEMIDE (LASIX) 40 MG TABLET    Take 1 tablet (40 mg total) by mouth every other day.   GABAPENTIN (NEURONTIN) 100 MG CAPSULE    Take 2 capsules (200 mg total) by mouth at bedtime.   GLUCOSE BLOOD (CONTOUR NEXT TEST) TEST STRIP    Check 3 times a day   HYDROCHLOROTHIAZIDE (HYDRODIURIL) 25 MG TABLET    Take 1 tablet (25 mg total) by mouth daily.   HYDROXYZINE (ATARAX) 25 MG TABLET    Take 1 tablet (25 mg total) by mouth 3 (three) times daily as needed for anxiety.   IBUPROFEN (ADVIL) 800 MG TABLET    Take 1 tablet (800 mg total) by mouth every 8 (eight) hours as needed for moderate pain.   IPRATROPIUM (ATROVENT) 0.03 % NASAL SPRAY    Place 2 sprays into both nostrils every 12 (twelve) hours.   MAGIC MOUTHWASH W/LIDOCAINE SOLN    Take 5 mLs by mouth 3 (three) times daily as needed.   METFORMIN (GLUCOPHAGE-XR) 750 MG 24 HR TABLET    TAKE 1 TABLET BY MOUTH EVERY MORNING AND TAKE 1 TABLET BY MOUTH EVERY NIGHT AT BEDTIME   METOCLOPRAMIDE (REGLAN) 10 MG TABLET    Take 1 tablet (10 mg total) by mouth 3 (three) times daily before meals.   MONTELUKAST  (SINGULAIR) 10 MG TABLET    Take 1 tablet (10 mg total) by mouth at bedtime.   NAPROXEN (NAPROSYN) 500 MG TABLET    TAKE 1 TABLET BY MOUTH 2 TIMES A DAY AS NEEDED FOR MODERATE PAIN   OMEPRAZOLE (PRILOSEC) 40 MG CAPSULE    Take 1 capsule (40 mg total) by mouth daily.   ONDANSETRON (ZOFRAN-ODT) 4 MG DISINTEGRATING TABLET    Take 1 tablet (4 mg total) by mouth every 8 (eight) hours as needed for nausea or vomiting.   PREDNISONE (DELTASONE) 20 MG TABLET    Take 2 tablets (40 mg total) by mouth daily with breakfast.   PROPRANOLOL (INDERAL) 10 MG TABLET    Take 1 tablet (10 mg total) by mouth daily.   TIZANIDINE (ZANAFLEX) 2 MG TABLET    Take 1 tablet (2 mg total) by mouth 3 (three) times daily as needed for muscle spasms.   VALSARTAN (DIOVAN) 80 MG TABLET    Take 1 tablet (80 mg total) by mouth 2 (two) times daily.   VENLAFAXINE XR (EFFEXOR-XR) 150 MG 24 HR CAPSULE    Take 150 mg by mouth daily with breakfast.   VITAMIN D, ERGOCALCIFEROL, (DRISDOL) 1.25 MG (50000 UNIT) CAPS CAPSULE    TAKE ONE CAPSULE BY MOUTH ONCE WEEKLY  Modified Medications   No medications on file  Discontinued Medications   TIRZEPATIDE (MOUNJARO) 7.5 MG/0.5ML PEN    Inject 7.5 mg into the skin once a week.    Allergies Allergies  Allergen Reactions   Flexeril [Cyclobenzaprine Hcl] Shortness Of Breath   Lorazepam     Other reaction(s): Other Neck spasm    Past Medical History Past Medical History:  Diagnosis Date   ADHD (attention deficit hyperactivity disorder) 03/01/2011   Anxiety    Arthritis    neck    Asthma    as a child   Borderline personality disorder (HCC) 05/26/2019   Bronchospasm 01/19/2014   Formatting of this note might be different from the original. Prn albuterol, WARI   Chronic hypertension in pregnancy 06/05/2020   Chronic neck pain 04/04/2019   Complex atypical endometrial  hyperplasia 02/24/2019   Depression    Fibromyalgia    Hepatic steatosis    Hypertension    Hypertension during pregnancy in  third trimester 04/02/2020   IBS (irritable bowel syndrome)    Newly diagnosed diabetes (HCC) 03/09/2019   Obesity    Panic attack    Torticollis 01/19/2014   Valium overdose     Past Surgical History Past Surgical History:  Procedure Laterality Date   big toes ingrown toenail removed     CESAREAN SECTION N/A 05/30/2020   Procedure: CESAREAN SECTION;  Surgeon: Shea Evans, MD;  Location: [facility];  Service: Obstetrics;  Laterality: N/A;   DILATATION & CURETTAGE/HYSTEROSCOPY WITH MYOSURE N/A 02/24/2019   Procedure: DILATATION & CURETTAGE/HYSTEROSCOPY WITH MYOSURE;  Surgeon: Shea Evans, MD;  Location: [facility];  Service: Gynecology;  Laterality: N/A;   MOUTH SURGERY     "baby teeth removed,adult teeth  pulled down"   RADIOLOGY WITH ANESTHESIA N/A 06/07/2019   Procedure: MRI CERVICAL SPINE WITHOUT CONTRAST;  Surgeon: Radiologist, Medication, MD;  Location: [facility] OR;  Service: Radiology;  Laterality: N/A;   wisdom teeth      Family History family history includes Anxiety disorder in her father; Asthma in her daughter; COPD in her paternal grandmother; Depression in her father and mother; Hearing loss in her father; Heart attack in her paternal grandmother; Heart disease in her maternal grandfather and mother; Heart failure in her paternal grandmother; High blood pressure in her mother; Kidney disease in her mother; Learning disabilities in her daughter; Lung cancer in her paternal grandfather; Multiple sclerosis in her maternal grandmother; Other in her brother; Psychosis in her mother; Stroke in her paternal grandmother.  Social History Social History   Socioeconomic History   Marital status: Single    Spouse name: Not on file   Number of children: Not on file   Years of education: Not on file   Highest education level: Some college, no degree  Occupational History   Not on file  Tobacco Use   Smoking status: Former    Current packs/day: 0.00    Average  packs/day: 0.5 packs/day for 4.0 years (2.0 ttl pk-yrs)    Types: Cigarettes    Start date: 01/13/2001    Quit date: 01/13/2005    Years since quitting: 18.0   Smokeless tobacco: Never  Vaping Use   Vaping status: Never Used  Substance and Sexual Activity   Alcohol use: Not Currently   Drug use: No   Sexual activity: Not Currently  Other Topics Concern   Not on file  Social History Narrative   Not on file   Social Drivers of Health   Financial Resource Strain: Medium Risk (05/08/2021)   Overall Financial Resource Strain (CARDIA)    Difficulty of Paying Living Expenses: Somewhat hard  Food Insecurity: Food Insecurity Present (05/08/2021)   Hunger Vital Sign    Worried About Running Out of Food in the Last Year: Sometimes true    Ran Out of Food in the Last Year: Sometimes true  Transportation Needs: No Transportation Needs (05/08/2021)   PRAPARE - Administrator, Civil Service (Medical): No    Lack of Transportation (Non-Medical): No  Physical Activity: Sufficiently Active (05/08/2021)   Exercise Vital Sign    Days of Exercise per Week: 4 days    Minutes of Exercise per Session: 40 min  Stress: Stress Concern Present (05/08/2021)   [employer] of Occupational Health - Occupational Stress Questionnaire  Feeling of Stress : Very much  Social Connections: Socially Isolated (05/08/2021)   Social Connection and Isolation Panel [NHANES]    Frequency of Communication with Friends and Family: More than three times a week    Frequency of Social Gatherings with Friends and Family: Three times a week    Attends Religious Services: Never    Active Member of Clubs or Organizations: No    Attends Banker Meetings: Not on file    Marital Status: Never married  Intimate Partner Violence: Not on file    Lab Results  Component Value Date   HGBA1C 8.1 (H) 12/19/2022   HGBA1C 9.1 (A) 07/15/2022   HGBA1C 6.0 04/01/2021   Lab Results  Component Value Date   CHOL  119 12/19/2022   Lab Results  Component Value Date   HDL 33 (L) 12/19/2022   Lab Results  Component Value Date   LDLCALC 61 12/19/2022   Lab Results  Component Value Date   TRIG 186 (H) 12/19/2022   Lab Results  Component Value Date   CHOLHDL 3.6 12/19/2022   Lab Results  Component Value Date   CREATININE 0.73 12/19/2022   Lab Results  Component Value Date   GFR 115.39 04/01/2021   Lab Results  Component Value Date   MICROALBUR 21.2 12/19/2022      Component Value Date/Time   NA 142 12/19/2022 0927   NA 142 03/08/2021 1609   K 3.7 12/19/2022 0927   CL 103 12/19/2022 0927   CO2 30 12/19/2022 0927   GLUCOSE 177 (H) 12/19/2022 0927   BUN 12 12/19/2022 0927   BUN 17 03/08/2021 1609   CREATININE 0.73 12/19/2022 0927   CALCIUM 8.8 12/19/2022 0927   PROT 6.5 12/19/2022 0927   ALBUMIN 4.1 04/01/2021 1410   AST 54 (H) 12/19/2022 0927   ALT 38 (H) 12/19/2022 0927   ALKPHOS 45 04/01/2021 1410   BILITOT 0.4 12/19/2022 0927   GFRNONAA >60 06/07/2020 1746   GFRAA >60 07/15/2019 1402      Latest Ref Rng & Units 12/19/2022    9:27 AM 04/01/2021    2:10 PM 03/08/2021    4:09 PM  BMP  Glucose 65 - 99 mg/dL 409  811  85   BUN 7 - 25 mg/dL 12  17  17    Creatinine 0.50 - 0.97 mg/dL 9.14  7.82  9.56   BUN/Creat Ratio 6 - 22 (calc) SEE NOTE:   27   Sodium 135 - 146 mmol/L 142  138  142   Potassium 3.5 - 5.3 mmol/L 3.7  3.7  4.4   Chloride 98 - 110 mmol/L 103  103  101   CO2 20 - 32 mmol/L 30  26  25    Calcium 8.6 - 10.2 mg/dL 8.8  9.2  9.6        Component Value Date/Time   WBC 7.0 04/01/2021 1410   RBC 4.14 04/01/2021 1410   HGB 12.4 04/01/2021 1410   HGB 10.5 (L) 04/02/2020 1648   HCT 36.2 04/01/2021 1410   HCT 30.4 (L) 04/02/2020 1648   PLT 176.0 04/01/2021 1410   PLT 191 04/02/2020 1648   MCV 87.5 04/01/2021 1410   MCV 92 04/02/2020 1648   MCH 31.8 06/07/2020 1746   MCHC 34.3 04/01/2021 1410   RDW 14.2 04/01/2021 1410   RDW 13.0 04/02/2020 1648   LYMPHSABS  1.4 04/01/2021 1410   LYMPHSABS 1.2 04/02/2020 1648   MONOABS 0.4 04/01/2021 1410  EOSABS 0.1 04/01/2021 1410   EOSABS 0.1 04/02/2020 1648   BASOSABS 0.0 04/01/2021 1410   BASOSABS 0.0 04/02/2020 1648     Parts of this note may have been dictated using voice recognition software. There may be variances in spelling and vocabulary which are unintentional. Not all errors are proofread. Please notify the Thereasa Parkin if any discrepancies are noted or if the meaning of any statement is not clear.

## 2023-02-12 NOTE — Patient Instructions (Signed)

## 2023-02-24 ENCOUNTER — Encounter: Payer: Self-pay | Admitting: "Endocrinology

## 2023-03-05 ENCOUNTER — Other Ambulatory Visit: Payer: Self-pay

## 2023-03-05 ENCOUNTER — Other Ambulatory Visit: Payer: Self-pay | Admitting: "Endocrinology

## 2023-03-05 ENCOUNTER — Other Ambulatory Visit (HOSPITAL_BASED_OUTPATIENT_CLINIC_OR_DEPARTMENT_OTHER): Payer: Self-pay

## 2023-03-05 DIAGNOSIS — E1165 Type 2 diabetes mellitus with hyperglycemia: Secondary | ICD-10-CM

## 2023-03-05 MED ORDER — MOUNJARO 10 MG/0.5ML ~~LOC~~ SOAJ
10.0000 mg | SUBCUTANEOUS | 0 refills | Status: DC
  Filled 2023-03-05 – 2023-04-06 (×5): qty 2, 28d supply, fill #0

## 2023-03-06 ENCOUNTER — Other Ambulatory Visit (HOSPITAL_BASED_OUTPATIENT_CLINIC_OR_DEPARTMENT_OTHER): Payer: Self-pay

## 2023-03-06 ENCOUNTER — Telehealth: Payer: BC Managed Care – PPO | Admitting: Emergency Medicine

## 2023-03-06 DIAGNOSIS — J4521 Mild intermittent asthma with (acute) exacerbation: Secondary | ICD-10-CM

## 2023-03-06 DIAGNOSIS — R051 Acute cough: Secondary | ICD-10-CM | POA: Diagnosis not present

## 2023-03-06 DIAGNOSIS — J4541 Moderate persistent asthma with (acute) exacerbation: Secondary | ICD-10-CM | POA: Diagnosis not present

## 2023-03-06 MED ORDER — BENZONATATE 100 MG PO CAPS
100.0000 mg | ORAL_CAPSULE | Freq: Three times a day (TID) | ORAL | 0 refills | Status: DC | PRN
  Filled 2023-03-06: qty 20, 4d supply, fill #0

## 2023-03-06 MED ORDER — PREDNISONE 20 MG PO TABS
20.0000 mg | ORAL_TABLET | Freq: Two times a day (BID) | ORAL | 0 refills | Status: AC
  Filled 2023-03-06: qty 10, 5d supply, fill #0

## 2023-03-06 MED ORDER — ALBUTEROL SULFATE HFA 108 (90 BASE) MCG/ACT IN AERS
2.0000 | INHALATION_SPRAY | Freq: Four times a day (QID) | RESPIRATORY_TRACT | 0 refills | Status: DC | PRN
  Filled 2023-03-06: qty 6.7, 25d supply, fill #0

## 2023-03-06 NOTE — Progress Notes (Signed)

## 2023-03-11 ENCOUNTER — Ambulatory Visit: Payer: BC Managed Care – PPO | Admitting: "Endocrinology

## 2023-03-16 ENCOUNTER — Other Ambulatory Visit (HOSPITAL_BASED_OUTPATIENT_CLINIC_OR_DEPARTMENT_OTHER): Payer: Self-pay

## 2023-03-17 ENCOUNTER — Other Ambulatory Visit (HOSPITAL_BASED_OUTPATIENT_CLINIC_OR_DEPARTMENT_OTHER): Payer: Self-pay

## 2023-03-20 ENCOUNTER — Other Ambulatory Visit (HOSPITAL_BASED_OUTPATIENT_CLINIC_OR_DEPARTMENT_OTHER): Payer: Self-pay

## 2023-03-21 ENCOUNTER — Other Ambulatory Visit: Payer: Self-pay | Admitting: Family Medicine

## 2023-03-21 DIAGNOSIS — K219 Gastro-esophageal reflux disease without esophagitis: Secondary | ICD-10-CM

## 2023-03-23 ENCOUNTER — Other Ambulatory Visit: Payer: Self-pay | Admitting: Family Medicine

## 2023-03-23 ENCOUNTER — Other Ambulatory Visit: Payer: Self-pay

## 2023-03-23 ENCOUNTER — Other Ambulatory Visit (HOSPITAL_BASED_OUTPATIENT_CLINIC_OR_DEPARTMENT_OTHER): Payer: Self-pay

## 2023-03-23 NOTE — Telephone Encounter (Signed)
 Last OV 06/19/21 Next OV 03/25/23  Last refill 06/17/22 Qty #60/0

## 2023-03-23 NOTE — Telephone Encounter (Signed)
 Patient is calling to say that per the pharmacy the prescription for [facility] needs a prior authorization.

## 2023-03-25 ENCOUNTER — Other Ambulatory Visit (HOSPITAL_BASED_OUTPATIENT_CLINIC_OR_DEPARTMENT_OTHER): Payer: Self-pay

## 2023-03-25 ENCOUNTER — Other Ambulatory Visit: Payer: Self-pay

## 2023-03-25 ENCOUNTER — Encounter: Payer: Self-pay | Admitting: Family Medicine

## 2023-03-25 ENCOUNTER — Ambulatory Visit: Admitting: Family Medicine

## 2023-03-25 VITALS — BP 104/68 | HR 108 | Ht 66.0 in | Wt 240.0 lb

## 2023-03-25 DIAGNOSIS — M79602 Pain in left arm: Secondary | ICD-10-CM | POA: Diagnosis not present

## 2023-03-25 MED ORDER — GABAPENTIN 100 MG PO CAPS
200.0000 mg | ORAL_CAPSULE | Freq: Every day | ORAL | 0 refills | Status: DC
  Filled 2023-03-25: qty 60, 30d supply, fill #0

## 2023-03-25 MED ORDER — NITROGLYCERIN 0.2 MG/HR TD PT24
0.2000 mg | MEDICATED_PATCH | Freq: Every day | TRANSDERMAL | 1 refills | Status: DC
Start: 2023-03-25 — End: 2023-08-17
  Filled 2023-03-25: qty 30, 30d supply, fill #0

## 2023-03-25 NOTE — Patient Instructions (Addendum)
 Thank you for coming in today.  Please work on the home exercises the athletic trainer went over with you:  View at [email or web address] using code: [email or web address]  Nitroglycerin Protocol Apply 1/4 nitroglycerin patch to affected area daily. Change position of patch within the affected area every 24 hours. You may experience a headache during the first 1-2 weeks of using the patch, these should subside. If you experience headaches after beginning nitroglycerin patch treatment, you may take your preferred over the counter pain reliever. Another side effect of the nitroglycerin patch is skin irritation or rash related to patch adhesive. Please notify our office if you develop more severe headaches or rash, and stop the patch. Tendon healing with nitroglycerin patch may require 12 to 24 weeks depending on the extent of injury. Men should not use if taking Viagra, Cialis, or Levitra.  Do not use if you have migraines or rosacea.    If not getting better, let us know and we will refer to OT.  Check back in 6 weeks
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
## 2023-03-26 ENCOUNTER — Telehealth: Payer: Self-pay

## 2023-03-26 ENCOUNTER — Other Ambulatory Visit (HOSPITAL_BASED_OUTPATIENT_CLINIC_OR_DEPARTMENT_OTHER): Payer: Self-pay

## 2023-03-26 ENCOUNTER — Other Ambulatory Visit (HOSPITAL_COMMUNITY): Payer: Self-pay

## 2023-03-26 NOTE — Telephone Encounter (Signed)
 Pharmacy Patient Advocate Encounter   Received notification from Pt Calls Messages that prior authorization for Katie [name] is required/requested.   Insurance verification completed.   The patient is insured through [facility] .   Per test claim: GRACE PERIOD CLAIM. PATIENT REQUIRED TO PAY FOR THE FULL COST OF THE PRESCRIPTION. PATIENT TO CONTACT PLAN.   Pt needs to pay insurance premium

## 2023-03-26 NOTE — Telephone Encounter (Signed)
 Pt need PA done mounjaro 10 mg.

## 2023-03-27 ENCOUNTER — Telehealth: Payer: Self-pay

## 2023-03-27 NOTE — Telephone Encounter (Signed)
 Spoke to pt today regarding PA, pt called upset regarding pa needing to be done for her Mounjaro 10 mg. I informed pt that I had sent a message to the PA. Pt asked for sample while waiting for Pa to get done.     Samples of this drug  Mounjaro 2.5 mg were given to the patient, quantity 1, Lot Number [id number]

## 2023-03-27 NOTE — Telephone Encounter (Signed)
 Patient picked up sample of Mounjaro 2.5 mg quantity 1, Lot Number [id number]

## 2023-03-31 ENCOUNTER — Telehealth: Payer: Self-pay

## 2023-03-31 NOTE — Telephone Encounter (Signed)
 Pharmacy Patient Advocate Encounter   Received notification from Pt Calls Messages that prior authorization for Katie [name] is required/requested.   Insurance verification completed.   The patient is insured through [facility] .   Per test claim: GRACE PERIOD CLAIM. PATIENT REQUIRED TO PAY FOR THE FULL COST OF THE PRESCRIPTION. PATIENT TO CONTACT PLAN.    Pt needs to pay insurance premium     Pt call office back regarding PA, I informed pt about the information above regard PA.

## 2023-03-31 NOTE — Telephone Encounter (Signed)
 Called pt regarding PA, LVM  for pt to call office back regarding PA.

## 2023-03-31 NOTE — Telephone Encounter (Signed)
 Per encounter from 03/27/2023  Pharmacy Patient Advocate Encounter   Received notification from Pt Calls Messages that prior authorization for Katie [name] is required/requested.   Insurance verification completed.   The patient is insured through [facility] .   Per test claim: GRACE PERIOD CLAIM. PATIENT REQUIRED TO PAY FOR THE FULL COST OF THE PRESCRIPTION. PATIENT TO CONTACT PLAN.    Pt needs to pay insurance premium

## 2023-04-04 ENCOUNTER — Other Ambulatory Visit (HOSPITAL_BASED_OUTPATIENT_CLINIC_OR_DEPARTMENT_OTHER): Payer: Self-pay

## 2023-04-06 ENCOUNTER — Other Ambulatory Visit (HOSPITAL_BASED_OUTPATIENT_CLINIC_OR_DEPARTMENT_OTHER): Payer: Self-pay

## 2023-04-08 ENCOUNTER — Ambulatory Visit: Admitting: Family Medicine

## 2023-04-08 NOTE — Progress Notes (Deleted)
 [facility] [address] [place] [address] Phone: [phone] Subjective:    I'm seeing this patient by the request  of:  Karie Georges, MD  CC: wrist pain   [id number]  Katie [name] is a 38 y.o. female coming in with complaint of ***  Onset-  Location Duration-  Character- Aggravating factors- Reliving factors-  Therapies tried-  Severity-     Past Medical History:  Diagnosis Date   ADHD (attention deficit hyperactivity disorder) 03/01/2011   Anxiety    Arthritis    neck    Asthma    as a child   Borderline personality disorder (HCC) 05/26/2019   Bronchospasm 01/19/2014   Formatting of this note might be different from the original. Prn albuterol, WARI   Chronic hypertension in pregnancy 06/05/2020   Chronic neck pain 04/04/2019   Complex atypical endometrial hyperplasia 02/24/2019   Depression    Fibromyalgia    Hepatic steatosis    Hypertension    Hypertension during pregnancy in third trimester 04/02/2020   IBS (irritable bowel syndrome)    Newly diagnosed diabetes (HCC) 03/09/2019   Obesity    Panic attack    Torticollis 01/19/2014   Valium overdose    Past Surgical History:  Procedure Laterality Date   big toes ingrown toenail removed     CESAREAN SECTION N/A 05/30/2020   Procedure: CESAREAN SECTION;  Surgeon: Shea Evans, MD;  Location: [facility];  Service: Obstetrics;  Laterality: N/A;   DILATATION & CURETTAGE/HYSTEROSCOPY WITH MYOSURE N/A 02/24/2019   Procedure: DILATATION & CURETTAGE/HYSTEROSCOPY WITH MYOSURE;  Surgeon: Shea Evans, MD;  Location: [facility];  Service: Gynecology;  Laterality: N/A;   MOUTH SURGERY     "baby teeth removed,adult teeth  pulled down"   RADIOLOGY WITH ANESTHESIA N/A 06/07/2019   Procedure: MRI CERVICAL SPINE WITHOUT CONTRAST;  Surgeon: Radiologist, Medication, MD;  Location: [facility] OR;  Service: Radiology;  Laterality: N/A;   wisdom teeth     Social  History   Socioeconomic History   Marital status: Single    Spouse name: Not on file   Number of children: Not on file   Years of education: Not on file   Highest education level: Some college, no degree  Occupational History   Not on file  Tobacco Use   Smoking status: Former    Current packs/day: 0.00    Average packs/day: 0.5 packs/day for 4.0 years (2.0 ttl pk-yrs)    Types: Cigarettes    Start date: 01/13/2001    Quit date: 01/13/2005    Years since quitting: 18.2   Smokeless tobacco: Never  Vaping Use   Vaping status: Never Used  Substance and Sexual Activity   Alcohol use: Not Currently   Drug use: No   Sexual activity: Not Currently  Other Topics Concern   Not on file  Social History Narrative   Not on file   Social Drivers of Health   Financial Resource Strain: Medium Risk (05/08/2021)   Overall Financial Resource Strain (CARDIA)    Difficulty of Paying Living Expenses: Somewhat hard  Food Insecurity: Food Insecurity Present (05/08/2021)   Hunger Vital Sign    Worried About Running Out of Food in the Last Year: Sometimes true    Ran Out of Food in the Last Year: Sometimes true  Transportation Needs: No Transportation Needs (05/08/2021)   PRAPARE - Transportation    Lack of Transportation (Medical): No    Lack of  Transportation (Non-Medical): No  Physical Activity: Sufficiently Active (05/08/2021)   Exercise Vital Sign    Days of Exercise per Week: 4 days    Minutes of Exercise per Session: 40 min  Stress: Stress Concern Present (05/08/2021)   [employer] of Occupational Health - Occupational Stress Questionnaire    Feeling of Stress : Very much  Social Connections: Socially Isolated (05/08/2021)   Social Connection and Isolation Panel [NHANES]    Frequency of Communication with Friends and Family: More than three times a week    Frequency of Social Gatherings with Friends and Family: Three times a week    Attends Religious Services: Never    Active Member  of Clubs or Organizations: No    Attends Engineer, structural: Not on file    Marital Status: Never married   Allergies  Allergen Reactions   Flexeril [Cyclobenzaprine Hcl] Shortness Of Breath   Lorazepam     Other reaction(s): Other Neck spasm   Family History  Problem Relation Age of Onset   Heart disease Mother    Depression Mother    High blood pressure Mother    Kidney disease Mother    Psychosis Mother    Hearing loss Father    Depression Father    Anxiety disorder Father    Asthma Daughter    Learning disabilities Daughter    Heart disease Maternal Grandfather    Other Brother        fatty liver   Multiple sclerosis Maternal Grandmother    COPD Paternal Grandmother    Heart failure Paternal Grandmother    Heart attack Paternal Grandmother    Stroke Paternal Grandmother    Lung cancer Paternal Grandfather     Current Outpatient Medications (Endocrine & Metabolic):    metFORMIN (GLUCOPHAGE-XR) 750 MG 24 hr tablet, TAKE 1 TABLET BY MOUTH EVERY MORNING AND TAKE 1 TABLET BY MOUTH EVERY NIGHT AT BEDTIME   tirzepatide (MOUNJARO) 10 MG/0.5ML Pen, Inject 10 mg into the skin once a week.  Current Outpatient Medications (Cardiovascular):    atorvastatin (LIPITOR) 10 MG tablet, Take 1 tablet (10 mg total) by mouth daily.   furosemide (LASIX) 40 MG tablet, Take 1 tablet (40 mg total) by mouth every other day.   hydrochlorothiazide (HYDRODIURIL) 25 MG tablet, Take 1 tablet (25 mg total) by mouth daily.   nitroGLYCERIN (NITRODUR - DOSED IN MG/24 HR) 0.2 mg/hr patch, Place 1 patch (0.2 mg total) onto the skin daily. Place 1/4 of the patch over the affected area.   propranolol (INDERAL) 10 MG tablet, Take 1 tablet (10 mg total) by mouth daily.   valsartan (DIOVAN) 80 MG tablet, Take 1 tablet (80 mg total) by mouth 2 (two) times daily.  Current Outpatient Medications (Respiratory):    albuterol (VENTOLIN HFA) 108 (90 Base) MCG/ACT inhaler, Inhale 2 puffs into the lungs  every 6 (six) hours as needed for wheezing or shortness of breath.   benzonatate (TESSALON) 100 MG capsule, Take 1-2 capsules (100-200 mg total) by mouth 3 (three) times daily as needed.   brompheniramine-pseudoephedrine-DM 30-2-10 MG/5ML syrup, Take 5 mLs by mouth 4 (four) times daily as needed.   cetirizine (ZYRTEC) 10 MG tablet, Take 10 mg by mouth daily.   fluticasone (FLONASE) 50 MCG/ACT nasal spray, Place 2 sprays into both nostrils daily.   ipratropium (ATROVENT) 0.03 % nasal spray, Place 2 sprays into both nostrils every 12 (twelve) hours.   magic mouthwash w/lidocaine SOLN*, Take 5 mLs by mouth 3 (three)  times daily as needed.   montelukast (SINGULAIR) 10 MG tablet, Take 1 tablet (10 mg total) by mouth at bedtime.  Current Outpatient Medications (Analgesics):    acetaminophen (TYLENOL) 500 MG tablet, Take 2 tablets (1,000 mg total) by mouth every 6 (six) hours. (Patient taking differently: Take 1,000 mg by mouth as needed.)   ibuprofen (ADVIL) 800 MG tablet, Take 1 tablet (800 mg total) by mouth every 8 (eight) hours as needed for moderate pain.   naproxen (NAPROSYN) 500 MG tablet, TAKE 1 TABLET BY MOUTH 2 TIMES A DAY AS NEEDED FOR MODERATE PAIN  Current Outpatient Medications (Hematological):    Ferrous Sulfate (IRON PO), Take by mouth daily.  Current Outpatient Medications (Other):    blood glucose meter kit and supplies KIT, Use as directed twice a day   Blood Glucose Monitoring Suppl (CONTOUR NEXT ONE) KIT, Use as directed   busPIRone (BUSPAR) 7.5 MG tablet, Take 1 tablet (7.5 mg total) by mouth 3 (three) times daily.   Continuous Glucose Sensor (DEXCOM G7 SENSOR) MISC, Use 1 Device continuous.   desvenlafaxine (PRISTIQ) 50 MG 24 hr tablet, Take 1 tablet (50 mg total) by mouth daily.   doxycycline (VIBRA-TABS) 100 MG tablet, Take 1 tablet (100 mg total) by mouth 2 (two) times daily.   famotidine (PEPCID) 20 MG tablet, TAKE 1 TABLET BY MOUTH EVERY MORNING AND TAKE 1 TABLET BY  MOUTH EVERY NIGHT AT BEDTIME   gabapentin (NEURONTIN) 100 MG capsule, Take 2 capsules (200 mg total) by mouth at bedtime.   glucose blood (CONTOUR NEXT TEST) test strip, Check 3 times a day   hydrOXYzine (ATARAX) 25 MG tablet, Take 1 tablet (25 mg total) by mouth 3 (three) times daily as needed for anxiety.   magic mouthwash w/lidocaine SOLN*, Take 5 mLs by mouth 3 (three) times daily as needed.   metoCLOPramide (REGLAN) 10 MG tablet, Take 1 tablet (10 mg total) by mouth 3 (three) times daily before meals.   omeprazole (PRILOSEC) 40 MG capsule, TAKE 1 CAPSULE BY MOUTH DAILY   ondansetron (ZOFRAN-ODT) 4 MG disintegrating tablet, Take 1 tablet (4 mg total) by mouth every 8 (eight) hours as needed for nausea or vomiting.   tiZANidine (ZANAFLEX) 2 MG tablet, Take 1 tablet (2 mg total) by mouth 3 (three) times daily as needed for muscle spasms.   venlafaxine XR (EFFEXOR-XR) 150 MG 24 hr capsule, Take 150 mg by mouth daily with breakfast.   Vitamin D, Ergocalciferol, (DRISDOL) 1.25 MG (50000 UNIT) CAPS capsule, TAKE ONE CAPSULE BY MOUTH ONCE WEEKLY * These medications belong to multiple therapeutic classes and are listed under each applicable group.   Reviewed prior external information including notes and imaging from  primary care provider As well as notes that were available from care everywhere and other healthcare systems.  Past medical history, social, surgical and family history all reviewed in electronic medical record.  No pertanent information unless stated regarding to the chief complaint.   Review of Systems:  No headache, visual changes, nausea, vomiting, diarrhea, constipation, dizziness, abdominal pain, skin rash, fevers, chills, night sweats, weight loss, swollen lymph nodes, body aches, joint swelling, chest pain, shortness of breath, mood changes. POSITIVE muscle aches  Objective  unknown if currently breastfeeding.   General: No apparent distress alert and oriented x3 mood and  affect normal, dressed appropriately.  HEENT: Pupils equal, extraocular movements intact  Respiratory: Patient's speak in full sentences and does not appear short of breath  Cardiovascular: No lower extremity edema,  non tender, no erythema  Wrist exam shows   Limited muscular skeletal ultrasound was performed and interpreted by Antoine Primas, M      Impression and Recommendations:    The above documentation has been reviewed and is accurate and complete Judi Saa, DO

## 2023-04-10 ENCOUNTER — Telehealth: Admitting: Physician Assistant

## 2023-04-10 DIAGNOSIS — R197 Diarrhea, unspecified: Secondary | ICD-10-CM

## 2023-04-10 DIAGNOSIS — R112 Nausea with vomiting, unspecified: Secondary | ICD-10-CM

## 2023-04-10 MED ORDER — ONDANSETRON 4 MG PO TBDP: 4.0000 mg | ORAL_TABLET | Freq: Three times a day (TID) | ORAL | 0 refills | Status: DC | PRN

## 2023-04-10 NOTE — Progress Notes (Signed)
E-Visit for Nausea and Vomiting   We are sorry that you are not feeling well. Here is how we plan to help!  Based on what you have shared with me it looks like you have a Virus that is irritating your GI tract.  Vomiting is the forceful emptying of a portion of the stomach's content through the mouth.  Although nausea and vomiting can make you feel miserable, it's important to remember that these are not diseases, but rather symptoms of an underlying illness.  When we treat short term symptoms, we always caution that any symptoms that persist should be fully evaluated in a medical office.  I have prescribed a medication that will help alleviate your symptoms and allow you to stay hydrated:  Zofran 4 mg 1 tablet every 8 hours as needed for nausea and vomiting  HOME CARE: Drink clear liquids.  This is very important! Dehydration (the lack of fluid) can lead to a serious complication.  Start off with 1 tablespoon every 5 minutes for 8 hours. You may begin eating bland foods after 8 hours without vomiting.  Start with saltine crackers, white bread, rice, mashed potatoes, applesauce. After 48 hours on a bland diet, you may resume a normal diet. Try to go to sleep.  Sleep often empties the stomach and relieves the need to vomit.  GET HELP RIGHT AWAY IF:  Your symptoms do not improve or worsen within 2 days after treatment. You have a fever for over 3 days. You cannot keep down fluids after trying the medication.  MAKE SURE YOU:  Understand these instructions. Will watch your condition. Will get help right away if you are not doing well or get worse.    Thank you for choosing an e-visit.  Your e-visit answers were reviewed by a board certified advanced clinical practitioner to complete your personal care plan. Depending upon the condition, your plan could have included both over the counter or prescription medications.  Please review your pharmacy choice. Make sure the pharmacy is open so  you can pick up prescription now. If there is a problem, you may contact your provider through MyChart messaging and have the prescription routed to another pharmacy.  Your safety is important to us. If you have drug allergies check your prescription carefully.   For the next 24 hours you can use MyChart to ask questions about today's visit, request a non-urgent call back, or ask for a work or school excuse. You will get an email in the next two days asking about your experience. I hope that your e-visit has been valuable and will speed your recovery.  Approximately 5 minutes was spent documenting and reviewing patient's chart.    

## 2023-04-20 ENCOUNTER — Encounter: Payer: Self-pay | Admitting: Cardiology

## 2023-04-21 MED ORDER — ATORVASTATIN CALCIUM 10 MG PO TABS: 10.0000 mg | ORAL_TABLET | Freq: Every day | ORAL | 6 refills | Status: DC

## 2023-04-22 ENCOUNTER — Encounter: Payer: Self-pay | Admitting: "Endocrinology

## 2023-04-22 ENCOUNTER — Other Ambulatory Visit: Payer: Self-pay

## 2023-04-22 ENCOUNTER — Ambulatory Visit: Admitting: "Endocrinology

## 2023-04-22 ENCOUNTER — Other Ambulatory Visit (HOSPITAL_BASED_OUTPATIENT_CLINIC_OR_DEPARTMENT_OTHER): Payer: Self-pay

## 2023-04-22 VITALS — BP 100/70 | HR 107 | Ht 66.0 in | Wt 240.0 lb

## 2023-04-22 DIAGNOSIS — Z7985 Long-term (current) use of injectable non-insulin antidiabetic drugs: Secondary | ICD-10-CM | POA: Diagnosis not present

## 2023-04-22 DIAGNOSIS — Z7984 Long term (current) use of oral hypoglycemic drugs: Secondary | ICD-10-CM

## 2023-04-22 DIAGNOSIS — E1165 Type 2 diabetes mellitus with hyperglycemia: Secondary | ICD-10-CM

## 2023-04-22 DIAGNOSIS — E782 Mixed hyperlipidemia: Secondary | ICD-10-CM

## 2023-04-22 LAB — POCT GLYCOSYLATED HEMOGLOBIN (HGB A1C): Hemoglobin A1C: 5.3 % (ref 4.0–5.6)

## 2023-04-22 MED ORDER — CONTOUR NEXT TEST VI STRP
ORAL_STRIP | 11 refills | Status: AC
  Filled 2023-04-22: qty 100, 30d supply, fill #0

## 2023-04-22 MED ORDER — TIRZEPATIDE 12.5 MG/0.5ML ~~LOC~~ SOAJ
12.5000 mg | SUBCUTANEOUS | 0 refills | Status: DC
  Filled 2023-04-22 – 2023-04-27 (×2): qty 2, 28d supply, fill #0

## 2023-04-22 NOTE — Progress Notes (Signed)
 Outpatient Endocrinology Note Altamese Hoschton, MD  04/22/23   [name] [birth year] [id number]  Referring Provider: Karie Georges, MD Primary Care Provider: Karie Georges, MD Reason for consultation: Subjective   Assessment & Plan  Diagnoses and all orders for this visit:  Uncontrolled type 2 diabetes mellitus with hyperglycemia (HCC) -     glucose blood (CONTOUR NEXT TEST) test strip; Check 3 times a day -     POCT glycosylated hemoglobin (Hb A1C)  Long term (current) use of oral hypoglycemic drugs  Long-term (current) use of injectable non-insulin antidiabetic drugs  Mixed hypercholesterolemia and hypertriglyceridemia  Other orders -     tirzepatide (MOUNJARO) 12.5 MG/0.5ML Pen; Inject 12.5 mg into the skin once a week.    Diabetes Type II complicated by neuropathy  Lab Results  Component Value Date   GFR 115.39 04/01/2021   Hba1c goal less than 7, current Hba1c is  Lab Results  Component Value Date   HGBA1C 5.3 04/22/2023   Will recommend the following: Metformin XR 750 mg bid-sometimes causes GI effects  Mounjaro 12.5 mg every day  DexCom not covered by insurance  Ordered DM education previously   No known contraindications/side effects to any of above medications No history of MEN syndrome/medullary thyroid cancer/pancreatitis or pancreatic cancer in self or family  -Last LD and Tg are as follows: Lab Results  Component Value Date   LDLCALC 61 12/19/2022    Lab Results  Component Value Date   TRIG 186 (H) 12/19/2022   -On lipitor 10 mg QD -Follow low fat diet and exercise   -Blood pressure goal <140/90 - Microalbumin/creatinine goal is < 30 -Last MA/Cr is as follows: Lab Results  Component Value Date   MICROALBUR 21.2 12/19/2022   -on ACE/ARB valsartan 80 mg qd -diet changes including salt restriction -limit eating outside -counseled BP targets per standards of diabetes care -uncontrolled blood pressure can lead  to retinopathy, nephropathy and cardiovascular and atherosclerotic heart disease  Reviewed and counseled on: -A1C target -Blood sugar targets -Complications of uncontrolled diabetes  -Checking blood sugar before meals and bedtime and bring log next visit -All medications with mechanism of action and side effects -Hypoglycemia management: rule of 15's, Glucagon Emergency Kit and medical alert ID -low-carb low-fat plate-method diet -At least 20 minutes of physical activity per day -Annual dilated retinal eye exam and foot exam -compliance and follow up needs -follow up as scheduled or earlier if problem gets worse  Call if blood sugar is less than 70 or consistently above 250    Take a 15 gm snack of carbohydrate at bedtime before you go to sleep if your blood sugar is less than 100.    If you are going to fast after midnight for a test or procedure, ask your physician for instructions on how to reduce/decrease your insulin dose.    Call if blood sugar is less than 70 or consistently above 250  -Treating a low sugar by rule of 15  (15 gms of sugar every 15 min until sugar is more than 70) If you feel your sugar is low, test your sugar to be sure If your sugar is low (less than 70), then take 15 grams of a fast acting Carbohydrate (3-4 glucose tablets or glucose gel or 4 ounces of juice or regular soda) Recheck your sugar 15 min after treating low to make sure it is more than 70 If sugar is still less than 70, treat again with  15 grams of carbohydrate          Don't drive the hour of hypoglycemia  If unconscious/unable to eat or drink by mouth, use glucagon injection or nasal spray baqsimi and call 911. Can repeat again in 15 min if still unconscious.  Return in about 6 weeks (around 06/03/2023).   I have reviewed current medications, nurse's notes, allergies, vital signs, past medical and surgical history, family medical history, and social history for this encounter. Counseled patient  on symptoms, examination findings, lab findings, imaging results, treatment decisions and monitoring and prognosis. The patient understood the recommendations and agrees with the treatment plan. All questions regarding treatment plan were fully answered.  Altamese [place], MD  04/22/23   History of Present Illness Katie [name] is a 38 y.o. year old female who presents for evaluation of Type II diabetes mellitus.   Katie [name] was first diagnosed in 2021 (had gestational diabetes 2007).   Diabetes education +  Home diabetes regimen: Metformin XR 750 mg bid Mounjaro 10 mg every day   COMPLICATIONS -  MI/Stroke -  retinopathy +  neuropathy -  nephropathy  SYMPTOMS REVIEWED - Polyuria - Weight loss - Blurred vision  BLOOD SUGAR DATA Checking BG 0-1/day 110-274 fasting   Physical Exam  BP 100/70   Pulse (!) 107   Ht 5\' 6"  (1.676 m)   Wt 240 lb (108.9 kg)   SpO2 99%   BMI 38.74 kg/m    Constitutional: well developed, well nourished Head: normocephalic, atraumatic Eyes: sclera anicteric, no redness Neck: supple Lungs: normal respiratory effort Neurology: alert and oriented Skin: dry, no appreciable rashes Musculoskeletal: no appreciable defects Psychiatric: normal mood and affect Diabetic Foot Exam - Simple   No data filed      Current Medications Patient's Medications  New Prescriptions   TIRZEPATIDE (MOUNJARO) 12.5 MG/0.5ML PEN    Inject 12.5 mg into the skin once a week.  Previous Medications   ACETAMINOPHEN (TYLENOL) 500 MG TABLET    Take 2 tablets (1,000 mg total) by mouth every 6 (six) hours.   ALBUTEROL (VENTOLIN HFA) 108 (90 BASE) MCG/ACT INHALER    Inhale 2 puffs into the lungs every 6 (six) hours as needed for wheezing or shortness of breath.   ATORVASTATIN (LIPITOR) 10 MG TABLET    Take 1 tablet (10 mg total) by mouth daily.   BENZONATATE (TESSALON) 100 MG CAPSULE    Take 1-2 capsules (100-200 mg total) by mouth 3 (three)  times daily as needed.   BLOOD GLUCOSE METER KIT AND SUPPLIES KIT    Use as directed twice a day   BLOOD GLUCOSE MONITORING SUPPL (CONTOUR NEXT ONE) KIT    Use as directed   BROMPHENIRAMINE-PSEUDOEPHEDRINE-DM 30-2-10 MG/5ML SYRUP    Take 5 mLs by mouth 4 (four) times daily as needed.   BUSPIRONE (BUSPAR) 7.5 MG TABLET    Take 1 tablet (7.5 mg total) by mouth 3 (three) times daily.   CETIRIZINE (ZYRTEC) 10 MG TABLET    Take 10 mg by mouth daily.   CONTINUOUS GLUCOSE SENSOR (DEXCOM G7 SENSOR) MISC    Use 1 Device continuous.   DESVENLAFAXINE (PRISTIQ) 50 MG 24 HR TABLET    Take 1 tablet (50 mg total) by mouth daily.   DOXYCYCLINE (VIBRA-TABS) 100 MG TABLET    Take 1 tablet (100 mg total) by mouth 2 (two) times daily.   FAMOTIDINE (PEPCID) 20 MG TABLET    TAKE 1 TABLET BY MOUTH EVERY MORNING  AND TAKE 1 TABLET BY MOUTH EVERY NIGHT AT BEDTIME   FERROUS SULFATE (IRON PO)    Take by mouth daily.   FLUTICASONE (FLONASE) 50 MCG/ACT NASAL SPRAY    Place 2 sprays into both nostrils daily.   FUROSEMIDE (LASIX) 40 MG TABLET    Take 1 tablet (40 mg total) by mouth every other day.   GABAPENTIN (NEURONTIN) 100 MG CAPSULE    Take 2 capsules (200 mg total) by mouth at bedtime.   HYDROCHLOROTHIAZIDE (HYDRODIURIL) 25 MG TABLET    Take 1 tablet (25 mg total) by mouth daily.   HYDROXYZINE (ATARAX) 25 MG TABLET    Take 1 tablet (25 mg total) by mouth 3 (three) times daily as needed for anxiety.   IBUPROFEN (ADVIL) 800 MG TABLET    Take 1 tablet (800 mg total) by mouth every 8 (eight) hours as needed for moderate pain.   IPRATROPIUM (ATROVENT) 0.03 % NASAL SPRAY    Place 2 sprays into both nostrils every 12 (twelve) hours.   MAGIC MOUTHWASH W/LIDOCAINE SOLN    Take 5 mLs by mouth 3 (three) times daily as needed.   METFORMIN (GLUCOPHAGE-XR) 750 MG 24 HR TABLET    TAKE 1 TABLET BY MOUTH EVERY MORNING AND TAKE 1 TABLET BY MOUTH EVERY NIGHT AT BEDTIME   METOCLOPRAMIDE (REGLAN) 10 MG TABLET    Take 1 tablet (10 mg total) by  mouth 3 (three) times daily before meals.   MONTELUKAST (SINGULAIR) 10 MG TABLET    Take 1 tablet (10 mg total) by mouth at bedtime.   NAPROXEN (NAPROSYN) 500 MG TABLET    TAKE 1 TABLET BY MOUTH 2 TIMES A DAY AS NEEDED FOR MODERATE PAIN   NITROGLYCERIN (NITRODUR - DOSED IN MG/24 HR) 0.2 MG/HR PATCH    Place 1 patch (0.2 mg total) onto the skin daily. Place 1/4 of the patch over the affected area.   OMEPRAZOLE (PRILOSEC) 40 MG CAPSULE    TAKE 1 CAPSULE BY MOUTH DAILY   ONDANSETRON (ZOFRAN-ODT) 4 MG DISINTEGRATING TABLET    Take 1 tablet (4 mg total) by mouth every 8 (eight) hours as needed for nausea or vomiting.   ONDANSETRON (ZOFRAN-ODT) 4 MG DISINTEGRATING TABLET    Take 1 tablet (4 mg total) by mouth every 8 (eight) hours as needed for nausea or vomiting.   PROPRANOLOL (INDERAL) 10 MG TABLET    Take 1 tablet (10 mg total) by mouth daily.   TIZANIDINE (ZANAFLEX) 2 MG TABLET    Take 1 tablet (2 mg total) by mouth 3 (three) times daily as needed for muscle spasms.   VALSARTAN (DIOVAN) 80 MG TABLET    Take 1 tablet (80 mg total) by mouth 2 (two) times daily.   VENLAFAXINE XR (EFFEXOR-XR) 150 MG 24 HR CAPSULE    Take 150 mg by mouth daily with breakfast.   VITAMIN D, ERGOCALCIFEROL, (DRISDOL) 1.25 MG (50000 UNIT) CAPS CAPSULE    TAKE ONE CAPSULE BY MOUTH ONCE WEEKLY  Modified Medications   Modified Medication Previous Medication   GLUCOSE BLOOD (CONTOUR NEXT TEST) TEST STRIP glucose blood (CONTOUR NEXT TEST) test strip      Check 3 times a day    Check 3 times a day  Discontinued Medications   TIRZEPATIDE (MOUNJARO) 10 MG/0.5ML PEN    Inject 10 mg into the skin once a week.    Allergies Allergies  Allergen Reactions   Flexeril [Cyclobenzaprine Hcl] Shortness Of Breath   Lorazepam  Other reaction(s): Other Neck spasm    Past Medical History Past Medical History:  Diagnosis Date   ADHD (attention deficit hyperactivity disorder) 03/01/2011   Anxiety    Arthritis    neck    Asthma     as a child   Borderline personality disorder (HCC) 05/26/2019   Bronchospasm 01/19/2014   Formatting of this note might be different from the original. Prn albuterol, WARI   Chronic hypertension in pregnancy 06/05/2020   Chronic neck pain 04/04/2019   Complex atypical endometrial hyperplasia 02/24/2019   Depression    Fibromyalgia    Hepatic steatosis    Hypertension    Hypertension during pregnancy in third trimester 04/02/2020   IBS (irritable bowel syndrome)    Newly diagnosed diabetes (HCC) 03/09/2019   Obesity    Panic attack    Torticollis 01/19/2014   Valium overdose     Past Surgical History Past Surgical History:  Procedure Laterality Date   big toes ingrown toenail removed     CESAREAN SECTION N/A 05/30/2020   Procedure: CESAREAN SECTION;  Surgeon: Shea Evans, MD;  Location: [facility];  Service: Obstetrics;  Laterality: N/A;   DILATATION & CURETTAGE/HYSTEROSCOPY WITH MYOSURE N/A 02/24/2019   Procedure: DILATATION & CURETTAGE/HYSTEROSCOPY WITH MYOSURE;  Surgeon: Shea Evans, MD;  Location: [facility];  Service: Gynecology;  Laterality: N/A;   MOUTH SURGERY     "baby teeth removed,adult teeth  pulled down"   RADIOLOGY WITH ANESTHESIA N/A 06/07/2019   Procedure: MRI CERVICAL SPINE WITHOUT CONTRAST;  Surgeon: Radiologist, Medication, MD;  Location: [facility] OR;  Service: Radiology;  Laterality: N/A;   wisdom teeth      Family History family history includes Anxiety disorder in her father; Asthma in her daughter; COPD in her paternal grandmother; Depression in her father and mother; Hearing loss in her father; Heart attack in her paternal grandmother; Heart disease in her maternal grandfather and mother; Heart failure in her paternal grandmother; High blood pressure in her mother; Kidney disease in her mother; Learning disabilities in her daughter; Lung cancer in her paternal grandfather; Multiple sclerosis in her maternal grandmother; Other in her brother; Psychosis in  her mother; Stroke in her paternal grandmother.  Social History Social History   Socioeconomic History   Marital status: Single    Spouse name: Not on file   Number of children: Not on file   Years of education: Not on file   Highest education level: Some college, no degree  Occupational History   Not on file  Tobacco Use   Smoking status: Former    Current packs/day: 0.00    Average packs/day: 0.5 packs/day for 4.0 years (2.0 ttl pk-yrs)    Types: Cigarettes    Start date: 01/13/2001    Quit date: 01/13/2005    Years since quitting: 18.2   Smokeless tobacco: Never  Vaping Use   Vaping status: Never Used  Substance and Sexual Activity   Alcohol use: Not Currently   Drug use: No   Sexual activity: Not Currently  Other Topics Concern   Not on file  Social History Narrative   Not on file   Social Drivers of Health   Financial Resource Strain: Medium Risk (05/08/2021)   Overall Financial Resource Strain (CARDIA)    Difficulty of Paying Living Expenses: Somewhat hard  Food Insecurity: Food Insecurity Present (05/08/2021)   Hunger Vital Sign    Worried About Running Out of Food in the Last Year: Sometimes true  Ran Out of Food in the Last Year: Sometimes true  Transportation Needs: No Transportation Needs (05/08/2021)   PRAPARE - Administrator, Civil Service (Medical): No    Lack of Transportation (Non-Medical): No  Physical Activity: Sufficiently Active (05/08/2021)   Exercise Vital Sign    Days of Exercise per Week: 4 days    Minutes of Exercise per Session: 40 min  Stress: Stress Concern Present (05/08/2021)   [employer] of Occupational Health - Occupational Stress Questionnaire    Feeling of Stress : Very much  Social Connections: Socially Isolated (05/08/2021)   Social Connection and Isolation Panel [NHANES]    Frequency of Communication with Friends and Family: More than three times a week    Frequency of Social Gatherings with Friends and Family:  Three times a week    Attends Religious Services: Never    Active Member of Clubs or Organizations: No    Attends Banker Meetings: Not on file    Marital Status: Never married  Intimate Partner Violence: Not on file    Lab Results  Component Value Date   HGBA1C 5.3 04/22/2023   HGBA1C 8.1 (H) 12/19/2022   HGBA1C 9.1 (A) 07/15/2022   Lab Results  Component Value Date   CHOL 119 12/19/2022   Lab Results  Component Value Date   HDL 33 (L) 12/19/2022   Lab Results  Component Value Date   LDLCALC 61 12/19/2022   Lab Results  Component Value Date   TRIG 186 (H) 12/19/2022   Lab Results  Component Value Date   CHOLHDL 3.6 12/19/2022   Lab Results  Component Value Date   CREATININE 0.73 12/19/2022   Lab Results  Component Value Date   GFR 115.39 04/01/2021   Lab Results  Component Value Date   MICROALBUR 21.2 12/19/2022      Component Value Date/Time   NA 142 12/19/2022 0927   NA 142 03/08/2021 1609   K 3.7 12/19/2022 0927   CL 103 12/19/2022 0927   CO2 30 12/19/2022 0927   GLUCOSE 177 (H) 12/19/2022 0927   BUN 12 12/19/2022 0927   BUN 17 03/08/2021 1609   CREATININE 0.73 12/19/2022 0927   CALCIUM 8.8 12/19/2022 0927   PROT 6.5 12/19/2022 0927   ALBUMIN 4.1 04/01/2021 1410   AST 54 (H) 12/19/2022 0927   ALT 38 (H) 12/19/2022 0927   ALKPHOS 45 04/01/2021 1410   BILITOT 0.4 12/19/2022 0927   GFRNONAA >60 06/07/2020 1746   GFRAA >60 07/15/2019 1402      Latest Ref Rng & Units 12/19/2022    9:27 AM 04/01/2021    2:10 PM 03/08/2021    4:09 PM  BMP  Glucose 65 - 99 mg/dL 161  096  85   BUN 7 - 25 mg/dL 12  17  17    Creatinine 0.50 - 0.97 mg/dL 0.45  4.09  8.11   BUN/Creat Ratio 6 - 22 (calc) SEE NOTE:   27   Sodium 135 - 146 mmol/L 142  138  142   Potassium 3.5 - 5.3 mmol/L 3.7  3.7  4.4   Chloride 98 - 110 mmol/L 103  103  101   CO2 20 - 32 mmol/L 30  26  25    Calcium 8.6 - 10.2 mg/dL 8.8  9.2  9.6        Component Value Date/Time    WBC 7.0 04/01/2021 1410   RBC 4.14 04/01/2021 1410   HGB 12.4 04/01/2021  1410   HGB 10.5 (L) 04/02/2020 1648   HCT 36.2 04/01/2021 1410   HCT 30.4 (L) 04/02/2020 1648   PLT 176.0 04/01/2021 1410   PLT 191 04/02/2020 1648   MCV 87.5 04/01/2021 1410   MCV 92 04/02/2020 1648   MCH 31.8 06/07/2020 1746   MCHC 34.3 04/01/2021 1410   RDW 14.2 04/01/2021 1410   RDW 13.0 04/02/2020 1648   LYMPHSABS 1.4 04/01/2021 1410   LYMPHSABS 1.2 04/02/2020 1648   MONOABS 0.4 04/01/2021 1410   EOSABS 0.1 04/01/2021 1410   EOSABS 0.1 04/02/2020 1648   BASOSABS 0.0 04/01/2021 1410   BASOSABS 0.0 04/02/2020 1648     Parts of this note may have been dictated using voice recognition software. There may be variances in spelling and vocabulary which are unintentional. Not all errors are proofread. Please notify the Thereasa Parkin if any discrepancies are noted or if the meaning of any statement is not clear.

## 2023-04-22 NOTE — Patient Instructions (Signed)

## 2023-04-23 ENCOUNTER — Other Ambulatory Visit (HOSPITAL_BASED_OUTPATIENT_CLINIC_OR_DEPARTMENT_OTHER): Payer: Self-pay

## 2023-04-26 ENCOUNTER — Other Ambulatory Visit: Payer: Self-pay | Admitting: Family Medicine

## 2023-04-26 DIAGNOSIS — K219 Gastro-esophageal reflux disease without esophagitis: Secondary | ICD-10-CM

## 2023-04-26 NOTE — Telephone Encounter (Signed)
 Pt has not followed up with me as recommended, please have her schedule an appointment if not already done.

## 2023-04-27 ENCOUNTER — Other Ambulatory Visit (HOSPITAL_BASED_OUTPATIENT_CLINIC_OR_DEPARTMENT_OTHER): Payer: Self-pay

## 2023-04-28 ENCOUNTER — Other Ambulatory Visit (HOSPITAL_BASED_OUTPATIENT_CLINIC_OR_DEPARTMENT_OTHER): Payer: Self-pay

## 2023-04-28 ENCOUNTER — Telehealth: Admitting: Family Medicine

## 2023-04-28 ENCOUNTER — Other Ambulatory Visit: Payer: Self-pay | Admitting: Family Medicine

## 2023-04-28 DIAGNOSIS — L089 Local infection of the skin and subcutaneous tissue, unspecified: Secondary | ICD-10-CM | POA: Diagnosis not present

## 2023-04-28 DIAGNOSIS — M797 Fibromyalgia: Secondary | ICD-10-CM

## 2023-04-28 MED ORDER — MUPIROCIN 2 % EX OINT
1.0000 | TOPICAL_OINTMENT | Freq: Two times a day (BID) | CUTANEOUS | 0 refills | Status: AC
Start: 2023-04-28 — End: ?
  Filled 2023-04-28: qty 22, 11d supply, fill #0

## 2023-04-28 NOTE — Telephone Encounter (Signed)
 Last OV 03/25/23 Next OV 05/06/23  Last refill 03/25/23 Qty #60/0

## 2023-04-28 NOTE — Progress Notes (Signed)
 E Visit for Rash  We are sorry that you are not feeling well. Here is how we plan to help   Based upon what you have shared with me it looks like you have a bacterial follicultits.  Folliculitis is inflammation of the hair follicles that can be caused by a superficial infection of the skin and is treated with an antibiotic. I have prescribed: and Topical mupiricin   HOME CARE:  Take cool showers and avoid direct sunlight. Apply cool compress or wet dressings. Take a bath in an oatmeal bath.  Sprinkle content of one Aveeno packet under running faucet with comfortably warm water.  Bathe for 15-20 minutes, 1-2 times daily.  Pat dry with a towel. Do not rub the rash. Use hydrocortisone cream. Take an antihistamine like Benadryl for widespread rashes that itch.  The adult dose of Benadryl is 25-50 mg by mouth 4 times daily. Caution:  This type of medication may cause sleepiness.  Do not drink alcohol, drive, or operate dangerous machinery while taking antihistamines.  Do not take these medications if you have prostate enlargement.  Read package instructions thoroughly on all medications that you take.  GET HELP RIGHT AWAY IF:  Symptoms don't go away after treatment. Severe itching that persists. If you rash spreads or swells. If you rash begins to smell. If it blisters and opens or develops a yellow-brown crust. You develop a fever. You have a sore throat. You become short of breath.  MAKE SURE YOU:  Understand these instructions. Will watch your condition. Will get help right away if you are not doing well or get worse.  Thank you for choosing an e-visit.  Your e-visit answers were reviewed by a board certified advanced clinical practitioner to complete your personal care plan. Depending upon the condition, your plan could have included both over the counter or prescription medications.  Please review your pharmacy choice. Make sure the pharmacy is open so you can pick up  prescription now. If there is a problem, you may contact your provider through [employer] and have the prescription routed to another pharmacy.  Your safety is important to us . If you have drug allergies check your prescription carefully.   For the next 24 hours you can use MyChart to ask questions about today's visit, request a non-urgent call back, or ask for a work or school excuse. You will get an email in the next two days asking about your experience. I hope that your e-visit has been valuable and will speed your recovery.  I provided 5 minutes of non face-to-face time during this encounter for chart review, medication and order placement, as well as and documentation.

## 2023-05-04 ENCOUNTER — Ambulatory Visit: Payer: Self-pay

## 2023-05-04 NOTE — Telephone Encounter (Signed)
 Patient was scheduled for an appt today with Kingsley Penny at 3:40pm today.

## 2023-05-04 NOTE — Telephone Encounter (Signed)
 Copied from CRM [id number]. Topic: Clinical - Red Word Triage >> May 04, 2023 11:39 AM Chuck Crater wrote: Red Word that prompted transfer to Nurse Triage: Patient has spots (lump) on the bottom of feet is starting to hurt, hurts to walk or stand. She states it start to hurt when she forgets her medicine.   Chief Complaint: Lumps to bottom of left foot Symptoms: Lump to bottom of left foot, some pain to the area  Frequency: Constant  Pertinent Negatives: Patient denies fever, redness Disposition: [] ED /[] Urgent Care (no appt availability in office) / [x] Appointment(In office/virtual)/ []  [facility]/ [] Home Care/ [] Refused Recommended Disposition /[] [facility]/ []  Follow-up with PCP Additional Notes: Patient states that she has had a lump to the bottom of her left foot "for a while now." She states that recently she noticed a second lump to the bottom of the same foot. She states that the area is intermittently painful. She denies any discoloration or itchiness to the area. She is unsure if this related to her diabetes. Appointment made for the patient tomorrow.     Reason for Disposition  [1] Swelling is painful to touch AND [2] no fever  Answer Assessment - Initial Assessment Questions 1. APPEARANCE of SWELLING: "What does it look like?"     Small lumps on bottom of left foot  2. SIZE: "How large is the swelling?" (e.g., inches, cm; or compare to size of pinhead, tip of pen, eraser, coin, pea, grape, ping pong ball)      One is 1/2 round, other is 1/4 inch round  3. LOCATION: "Where is the swelling located?"     Bottom of left foot  4. ONSET: "When did the swelling start?"     Ongoing problem  5. COLOR: "What color is it?" "Is there more than one color?"     Same color as skin  6. PAIN: "Is there any pain?" If Yes, ask: "How bad is the pain?" (e.g., scale 1-10; or mild, moderate, severe)     - NONE (0): no pain   - MILD (1-3): doesn't interfere with normal  activities    - MODERATE (4-7): interferes with normal activities or awakens from sleep    - SEVERE (8-10): excruciating pain, unable to do any normal activities     8/10 at times  7. ITCH: "Does it itch?" If Yes, ask: "How bad is the itch?"      No 8. CAUSE: "What do you think caused the swelling?"     Unsure if due to diabetes  9 OTHER SYMPTOMS: "Do you have any other symptoms?" (e.g., fever)     No  Protocols used: Skin Lump or Localized Swelling-A-AH

## 2023-05-04 NOTE — Telephone Encounter (Signed)
 Ok to close

## 2023-05-05 ENCOUNTER — Encounter: Payer: Self-pay | Admitting: Family Medicine

## 2023-05-05 ENCOUNTER — Ambulatory Visit (INDEPENDENT_AMBULATORY_CARE_PROVIDER_SITE_OTHER): Admitting: Family Medicine

## 2023-05-05 VITALS — BP 126/72 | HR 88 | Temp 98.0°F | Wt 238.0 lb

## 2023-05-05 DIAGNOSIS — L84 Corns and callosities: Secondary | ICD-10-CM | POA: Diagnosis not present

## 2023-05-05 DIAGNOSIS — M79672 Pain in left foot: Secondary | ICD-10-CM

## 2023-05-05 NOTE — Patient Instructions (Signed)
-  It was a pleasure to take care of you today. -Placed a referral to podiatry for left foot pain and mass under the skin.  -Recommend to try an additional Gabapentin  100mg  when you have severe pain to see if this helps. Also, can take alternate Tylenol  1000mg  and Ibuprofen  600-800mg  every 4 hours for pain. Recommend to eat something when taking Ibuprofen .

## 2023-05-05 NOTE — Progress Notes (Signed)
   Acute Office Visit   Subjective:  Patient ID: Katie [name], female    DOB: 10/27/1985, 38 y.o.   MRN: [id number]  Chief Complaint  Patient presents with   Mass    HPI Patient is complaining of left foot pain and mass on the outer left foot underneath skin. She reports the pain started intermittent pain for about a year, but became severe this past Sunday. Pain usually occurs about 2 times a week. Pain radiates down the foot and across. Also, the mass has been present for about a year or too. Reports she has had numbness/tingling when she missed her Metformin . She reports she didn't miss her Metformin  this weekend, just took later in the day than usual. Denies any injury to foot.   ROS See HPI above      Objective:   BP 126/72   Pulse 88   Temp 98 F (36.7 C) (Oral)   Wt 238 lb (108 kg)   SpO2 98%   BMI 38.41 kg/m    Physical Exam Vitals reviewed.  Constitutional:      General: She is not in acute distress.    Appearance: Normal appearance. She is not ill-appearing, toxic-appearing or diaphoretic.  Eyes:     General:        Right eye: No discharge.        Left eye: No discharge.     Conjunctiva/sclera: Conjunctivae normal.  Pulmonary:     Effort: Pulmonary effort is normal. No respiratory distress.     Breath sounds: Normal breath sounds.  Musculoskeletal:        General: Normal range of motion.  Skin:    General: Skin is warm and dry.  Neurological:     General: No focal deficit present.     Mental Status: She is alert and oriented to person, place, and time. Mental status is at baseline.  Psychiatric:        Mood and Affect: Mood normal.        Behavior: Behavior normal.        Thought Content: Thought content normal.        Judgment: Judgment normal.          Assessment & Plan:  Left foot pain -     Ambulatory referral to Podiatry  Callus of foot -     Ambulatory referral to Podiatry  -Placed a referral to podiatry for left foot pain  and mass under the skin. Suspect she has callus.  -Recommend to try an additional Gabapentin  100mg  when she has severe pain to see if this helps. Also, can take alternate Tylenol  1000mg  and Ibuprofen  600-800mg  every 4 hours for pain. Recommend to eat something when taking Ibuprofen .    Connee Ikner, NP

## 2023-05-06 ENCOUNTER — Other Ambulatory Visit (HOSPITAL_BASED_OUTPATIENT_CLINIC_OR_DEPARTMENT_OTHER): Payer: Self-pay

## 2023-05-06 ENCOUNTER — Ambulatory Visit: Admitting: Family Medicine

## 2023-05-06 ENCOUNTER — Encounter: Payer: Self-pay | Admitting: Family Medicine

## 2023-05-06 VITALS — BP 110/72 | HR 102 | Ht 66.0 in | Wt 236.0 lb

## 2023-05-06 DIAGNOSIS — M25531 Pain in right wrist: Secondary | ICD-10-CM | POA: Diagnosis not present

## 2023-05-06 DIAGNOSIS — M25521 Pain in right elbow: Secondary | ICD-10-CM | POA: Diagnosis not present

## 2023-05-06 DIAGNOSIS — M79601 Pain in right arm: Secondary | ICD-10-CM | POA: Diagnosis not present

## 2023-05-06 MED ORDER — PREDNISONE 50 MG PO TABS
50.0000 mg | ORAL_TABLET | Freq: Every day | ORAL | 0 refills | Status: AC
  Filled 2023-05-06: qty 5, 5d supply, fill #0

## 2023-05-06 MED ORDER — GABAPENTIN 300 MG PO CAPS
300.0000 mg | ORAL_CAPSULE | Freq: Three times a day (TID) | ORAL | 2 refills | Status: DC
  Filled 2023-05-06: qty 90, 30d supply, fill #0

## 2023-05-06 NOTE — Patient Instructions (Signed)
 Thank you for coming in today.   Look for a Cubital Tunnel Elbow Brace.   A referral for physical/occupational therapy has been submitted. A representative from the physical/occupational therapy office will contact you to coordinate scheduling after confirming your benefits with your insurance provider. If you do not hear from the physical/occupational therapy office within the next 1-2 weeks, please let us  know.   Start Prednisone  and Gabapentin  as directed.   Let us  know how things are working.

## 2023-05-06 NOTE — Progress Notes (Signed)
   I, Miquel Amen, CMA acting as a scribe for Garlan Juniper, MD.  Katie [name] is a 38 y.o. female who presents to [facility] at [facility] today for R wrist pain. Pt was last seen by Dr. Alease Hunter on 03/25/23 for L elbow pain and was taught HEP and prescribe nitroglycerin  patches.  Today, pt reports n/t in the right hand and 1-3rd fingers. Sx worse at night. Pain radiating from the upper arm to the hand. Sx exacerbated with elbow flexion. Chronic neck pain and stiffness. Denies visible swelling.   Radiates: upper arm to hand Paresthesia: 1-3 fingers Grip strength: normal Aggravates: elbow flexion Treatments tried: bracing  Dx imaging: 06/07/19 C-spine MRI   Pertinent review of systems: No fevers or chills  Relevant historical information: Hypertension and diabetes   Exam:  BP 110/72   Pulse (!) 102   Ht 5\' 6"  (1.676 m)   Wt 236 lb (107 kg)   SpO2 98%   BMI 38.09 kg/m  General: Well Developed, well nourished, and in no acute distress.   MSK: Right arm normal-appearing Able to reproduce paresthesia with elbow flexion.  Intact strength.      Assessment and Plan: 38 y.o. female with right hand paresthesia.  Distribution of symptoms is consistent with the median nerve.  This typically would be carpal tunnel syndrome.  Given reproduction of symptoms with elbow flexion I am worried about pronator syndrome.  Plan to continue carpal tunnel brace but will add an elbow brace at bedtime.  Additionally his prednisone  and gabapentin .  Refer to OT as that should be quite helpful for pronator syndrome.  If needed next step would be either carpal tunnel injection or nerve conduction study.   PDMP not reviewed this encounter. Orders Placed This Encounter  Procedures   Ambulatory referral to Occupational Therapy    Referral Priority:   Routine    Referral Type:   Occupational Therapy    Referral Reason:   Specialty Services Required    Requested Specialty:    Occupational Therapy    Number of Visits Requested:   1   Meds ordered this encounter  Medications   predniSONE  (DELTASONE ) 50 MG tablet    Sig: Take 1 tablet (50 mg total) by mouth daily with breakfast for 5 days.    Dispense:  5 tablet    Refill:  0   gabapentin  (NEURONTIN ) 300 MG capsule    Sig: Take 1 capsule (300 mg total) by mouth 3 (three) times daily.    Dispense:  90 capsule    Refill:  2     Discussed warning signs or symptoms. Please see discharge instructions. Patient expresses understanding.   The above documentation has been reviewed and is accurate and complete Garlan Juniper, M.D.

## 2023-05-13 ENCOUNTER — Ambulatory Visit (INDEPENDENT_AMBULATORY_CARE_PROVIDER_SITE_OTHER): Admitting: Podiatry

## 2023-05-13 DIAGNOSIS — Z91199 Patient's noncompliance with other medical treatment and regimen due to unspecified reason: Secondary | ICD-10-CM

## 2023-05-13 NOTE — Progress Notes (Signed)
 No show

## 2023-05-20 NOTE — Progress Notes (Signed)
 [facility] [address] [place] [address] Phone: [phone] Subjective:   Katie [name], am serving as a scribe for Dr. Ronnell Coins.  I'm seeing this patient by the request  of:  Aida House, MD  CC: Right wrist pain, left arm pain  BJY:[id number]  06/19/2021 Patient has had torticollis for years. Patient has difficulty with right-sided rotation and left-sided sidebending. Patient has not undergone Botox therapy and will consider it. Patient has had physical therapy on different occasions. Given different exercises for more of the scapula that I think can be more beneficial. Started on gabapentin  as well as patient does not feel like she has made significant benefit with baclofen . Was changed to a 10 and see how patient responds. Patient will follow-up with me again 5 to 6 weeks otherwise. Patient also will have a epidural secondary to the nerve root impingement noted on the MRI. See how patient responds.   Updated 05/21/2023 Katie [name] is a 38 y.o. female coming in with complaint of R wrist pain. Last seen in 2023 for neck pain. Has been seeing Dr. Alease Hunter for R arm pain: April 2025. Patient states that she has been having pain in wrist and when she bends the elbow she has tingling into her hand. At night she has pain and tingling in arm when it is overhead.   Saw Dr. Alease Hunter for L distal tricep pain. Has pain when anything brushes up against her skin. She was uanble to get nitroglycerin  patches due to financial constraints. Works at a Hotel manager types on a computer.        Past Medical History:  Diagnosis Date   ADHD (attention deficit hyperactivity disorder) 03/01/2011   Anxiety    Arthritis    neck    Asthma    as a child   Borderline personality disorder (HCC) 05/26/2019   Bronchospasm 01/19/2014   Formatting of this note might be different from the original. Prn albuterol , WARI   Chronic hypertension in pregnancy  06/05/2020   Chronic neck pain 04/04/2019   Complex atypical endometrial hyperplasia 02/24/2019   Depression    Fibromyalgia    Hepatic steatosis    Hypertension    Hypertension during pregnancy in third trimester 04/02/2020   IBS (irritable bowel syndrome)    Newly diagnosed diabetes (HCC) 03/09/2019   Obesity    Panic attack    Torticollis 01/19/2014   Valium  overdose    Past Surgical History:  Procedure Laterality Date   big toes ingrown toenail removed     CESAREAN SECTION N/A 05/30/2020   Procedure: CESAREAN SECTION;  Surgeon: Terri Fester, MD;  Location: [facility];  Service: Obstetrics;  Laterality: N/A;   DILATATION & CURETTAGE/HYSTEROSCOPY WITH MYOSURE N/A 02/24/2019   Procedure: DILATATION & CURETTAGE/HYSTEROSCOPY WITH MYOSURE;  Surgeon: Terri Fester, MD;  Location: [facility];  Service: Gynecology;  Laterality: N/A;   MOUTH SURGERY     "baby teeth removed,adult teeth  pulled down"   RADIOLOGY WITH ANESTHESIA N/A 06/07/2019   Procedure: MRI CERVICAL SPINE WITHOUT CONTRAST;  Surgeon: Radiologist, Medication, MD;  Location: [facility] OR;  Service: Radiology;  Laterality: N/A;   wisdom teeth     Social History   Socioeconomic History   Marital status: Single    Spouse name: Not on file   Number of children: Not on file   Years of education: Not on file   Highest education level: Some college, no degree  Occupational History   Not on file  Tobacco Use   Smoking status: Former    Current packs/day: 0.00    Average packs/day: 0.5 packs/day for 4.0 years (2.0 ttl pk-yrs)    Types: Cigarettes    Start date: 01/13/2001    Quit date: 01/13/2005    Years since quitting: 18.3   Smokeless tobacco: Never  Vaping Use   Vaping status: Never Used  Substance and Sexual Activity   Alcohol use: Not Currently   Drug use: No   Sexual activity: Not Currently  Other Topics Concern   Not on file  Social History Narrative   Not on file   Social Drivers of Health   Financial  Resource Strain: Medium Risk (05/08/2021)   Overall Financial Resource Strain (CARDIA)    Difficulty of Paying Living Expenses: Somewhat hard  Food Insecurity: Food Insecurity Present (05/08/2021)   Hunger Vital Sign    Worried About Running Out of Food in the Last Year: Sometimes true    Ran Out of Food in the Last Year: Sometimes true  Transportation Needs: No Transportation Needs (05/08/2021)   PRAPARE - Administrator, Civil Service (Medical): No    Lack of Transportation (Non-Medical): No  Physical Activity: Sufficiently Active (05/08/2021)   Exercise Vital Sign    Days of Exercise per Week: 4 days    Minutes of Exercise per Session: 40 min  Stress: Stress Concern Present (05/08/2021)   [employer] of Occupational Health - Occupational Stress Questionnaire    Feeling of Stress : Very much  Social Connections: Socially Isolated (05/08/2021)   Social Connection and Isolation Panel [NHANES]    Frequency of Communication with Friends and Family: More than three times a week    Frequency of Social Gatherings with Friends and Family: Three times a week    Attends Religious Services: Never    Active Member of Clubs or Organizations: No    Attends Engineer, structural: Not on file    Marital Status: Never married   Allergies  Allergen Reactions   Flexeril [Cyclobenzaprine Hcl] Shortness Of Breath   Lorazepam     Other reaction(s): Other Neck spasm   Family History  Problem Relation Age of Onset   Heart disease Mother    Depression Mother    High blood pressure Mother    Kidney disease Mother    Psychosis Mother    Hearing loss Father    Depression Father    Anxiety disorder Father    Asthma Daughter    Learning disabilities Daughter    Heart disease Maternal Grandfather    Other Brother        fatty liver   Multiple sclerosis Maternal Grandmother    COPD Paternal Grandmother    Heart failure Paternal Grandmother    Heart attack Paternal Grandmother     Stroke Paternal Grandmother    Lung cancer Paternal Grandfather     Current Outpatient Medications (Endocrine & Metabolic):    metFORMIN  (GLUCOPHAGE -XR) 750 MG 24 hr tablet, TAKE 1 TABLET BY MOUTH EVERY MORNING AND TAKE 1 TABLET BY MOUTH EVERY NIGHT AT BEDTIME   tirzepatide  (MOUNJARO ) 12.5 MG/0.5ML Pen, Inject 12.5 mg into the skin once a week.  Current Outpatient Medications (Cardiovascular):    atorvastatin  (LIPITOR) 10 MG tablet, Take 1 tablet (10 mg total) by mouth daily.   furosemide  (LASIX ) 40 MG tablet, Take 1 tablet (40 mg total) by mouth every other day.   hydrochlorothiazide  (HYDRODIURIL ) 25  MG tablet, Take 1 tablet (25 mg total) by mouth daily.   nitroGLYCERIN  (NITRODUR - DOSED IN MG/24 HR) 0.2 mg/hr patch, Place 1 patch (0.2 mg total) onto the skin daily. Place 1/4 of the patch over the affected area.   valsartan  (DIOVAN ) 80 MG tablet, Take 1 tablet (80 mg total) by mouth 2 (two) times daily.  Current Outpatient Medications (Respiratory):    albuterol  (VENTOLIN  HFA) 108 (90 Base) MCG/ACT inhaler, Inhale 2 puffs into the lungs every 6 (six) hours as needed for wheezing or shortness of breath.   benzonatate  (TESSALON ) 100 MG capsule, Take 1-2 capsules (100-200 mg total) by mouth 3 (three) times daily as needed.   brompheniramine-pseudoephedrine-DM 30-2-10 MG/5ML syrup, Take 5 mLs by mouth 4 (four) times daily as needed.   cetirizine (ZYRTEC) 10 MG tablet, Take 10 mg by mouth daily.   fluticasone  (FLONASE ) 50 MCG/ACT nasal spray, Place 2 sprays into both nostrils daily.   ipratropium (ATROVENT ) 0.03 % nasal spray, Place 2 sprays into both nostrils every 12 (twelve) hours.   montelukast  (SINGULAIR ) 10 MG tablet, Take 1 tablet (10 mg total) by mouth at bedtime.  Current Outpatient Medications (Analgesics):    acetaminophen  (TYLENOL ) 500 MG tablet, Take 2 tablets (1,000 mg total) by mouth every 6 (six) hours. (Patient taking differently: Take 1,000 mg by mouth as needed.)    ibuprofen  (ADVIL ) 800 MG tablet, Take 1 tablet (800 mg total) by mouth every 8 (eight) hours as needed for moderate pain.   naproxen  (NAPROSYN ) 500 MG tablet, TAKE 1 TABLET BY MOUTH 2 TIMES A DAY AS NEEDED FOR MODERATE PAIN   Current Outpatient Medications (Other):    blood glucose meter kit and supplies KIT, Use as directed twice a day   Blood Glucose Monitoring Suppl (CONTOUR NEXT ONE) KIT, Use as directed   busPIRone  (BUSPAR ) 7.5 MG tablet, Take 1 tablet (7.5 mg total) by mouth 3 (three) times daily.   desvenlafaxine  (PRISTIQ ) 50 MG 24 hr tablet, Take 1 tablet (50 mg total) by mouth daily.   famotidine  (PEPCID ) 20 MG tablet, TAKE 1 TABLET BY MOUTH EVERY MORNING AND TAKE 1 TABLET BY MOUTH EVERY NIGHT AT BEDTIME   gabapentin  (NEURONTIN ) 100 MG capsule, TAKE TWO CAPSULES BY MOUTH AT BEDTIME   gabapentin  (NEURONTIN ) 300 MG capsule, Take 1 capsule (300 mg total) by mouth 3 (three) times daily.   glucose blood (CONTOUR NEXT TEST) test strip, Check 3 times a day   hydrOXYzine  (ATARAX ) 25 MG tablet, Take 1 tablet (25 mg total) by mouth 3 (three) times daily as needed for anxiety.   mupirocin  ointment (BACTROBAN ) 2 %, Apply 1 Application topically 2 (two) times daily.   omeprazole  (PRILOSEC) 40 MG capsule, TAKE 1 CAPSULE BY MOUTH DAILY   ondansetron  (ZOFRAN -ODT) 4 MG disintegrating tablet, Take 1 tablet (4 mg total) by mouth every 8 (eight) hours as needed for nausea or vomiting.   ondansetron  (ZOFRAN -ODT) 4 MG disintegrating tablet, Take 1 tablet (4 mg total) by mouth every 8 (eight) hours as needed for nausea or vomiting.   venlafaxine XR (EFFEXOR-XR) 150 MG 24 hr capsule, Take 150 mg by mouth daily with breakfast.   Reviewed prior external information including notes and imaging from  primary care provider As well as notes that were available from care everywhere and other healthcare systems.  Past medical history, social, surgical and family history all reviewed in electronic medical record.   No pertanent information unless stated regarding to the chief complaint.   Review  of Systems:  No headache, visual changes, nausea, vomiting, diarrhea, constipation, dizziness, abdominal pain, skin rash, fevers, chills, night sweats, weight loss, swollen lymph nodes, body aches, joint swelling, chest pain, shortness of breath, mood changes. POSITIVE muscle aches  Objective  unknown if currently breastfeeding.   General: No apparent distress alert and oriented x3 mood and affect normal, dressed appropriately.  HEENT: Pupils equal, extraocular movements intact  Respiratory: Patient's speak in full sentences and does not appear short of breath  Cardiovascular: No lower extremity edema, non tender, no erythema  Right wrist does have a positive Tinel's noted.  Patient's grip strength is good overall.  No thenar eminence wasting.  No significant limitation noted.  Patient though does have some torticollis of the neck  Left arm severely tender approximately 2 cm proximal to the olecranon area just on the lateral aspect.  Questionable mass noted in this area.  This seems to be freely movable but highly tender.  Procedure: Real-time Ultrasound Guided Injection of right carpal tunnel Device: GE Logiq Q7  Ultrasound guided injection is preferred based studies that show increased duration, increased effect, greater accuracy, decreased procedural pain, increased response rate with ultrasound guided versus blind injection.  Verbal informed consent obtained.  Time-out conducted.  Noted no overlying erythema, induration, or other signs of local infection.  Skin prepped in a sterile fashion.  Local anesthesia: Topical Ethyl chloride.  With sterile technique and under real time ultrasound guidance:  median nerve visualized.  23g 5/8 inch needle inserted distal to proximal approach into nerve sheath. Pictures taken nfor needle placement. Patient did have injection of 0.5 cc of 0.5% Marcaine , and 1 cc of Kenalog   40 mg/dL. Completed without difficulty  Pain immediately resolved suggesting accurate placement of the medication.  Advised to call if fevers/chills, erythema, induration, drainage, or persistent bleeding.  Images saved Impression: Technically successful ultrasound guided injection.  Limited muscular skeletal ultrasound was performed and interpreted by Ronnell Coins, M  Limited ultrasound of patient's left arm shows that there is what appears to be a mass at the musculotendinous juncture likely in the tricep aspect.  Severely tender to compression with the probe.  No abnormal vascularity noted in the area. Impression: Questionable mass likely neurofibroma.    Impression and Recommendations:     The above documentation has been reviewed and is accurate and complete Katie Devery M Bodee Lafoe, DO

## 2023-05-21 ENCOUNTER — Other Ambulatory Visit: Payer: Self-pay

## 2023-05-21 ENCOUNTER — Ambulatory Visit: Admitting: Family Medicine

## 2023-05-21 VITALS — BP 122/82 | Ht 66.0 in | Wt 236.0 lb

## 2023-05-21 DIAGNOSIS — R2232 Localized swelling, mass and lump, left upper limb: Secondary | ICD-10-CM

## 2023-05-21 DIAGNOSIS — G5601 Carpal tunnel syndrome, right upper limb: Secondary | ICD-10-CM

## 2023-05-21 DIAGNOSIS — M79602 Pain in left arm: Secondary | ICD-10-CM | POA: Diagnosis not present

## 2023-05-21 DIAGNOSIS — M79601 Pain in right arm: Secondary | ICD-10-CM

## 2023-05-21 NOTE — Assessment & Plan Note (Signed)
 Patient has a cyst noted approximately 2 cm proximal to the olecranon area of the elbow.  This seems to be in the muscle itself.  No abnormal blood flow.  Differential is quite broad and could be a neurofibroma, seems to be benign with no vascularity.  Do feel though we should get further imaging.  Patient is having enough pain that is affecting daily activities, affecting sleep as well.  Will get MRI with contrast of the humerus secondary to the positioning of this with the elbow may be missing it.  Follow-up with me again after imaging to discuss further.

## 2023-05-21 NOTE — Assessment & Plan Note (Signed)
 Patient given injection today, given brace, home exercises.  Hopeful that this will make significant improvement.  He does have some cubital tunnel pain as well and we discussed compression sleeve.  Follow-up again in 6 to 8 weeks

## 2023-05-21 NOTE — Patient Instructions (Addendum)
 Carpal tunnel wrist brace  Compression sleeve elbow  MRI WO for L elbow [phone] See me again in 2 months

## 2023-05-22 ENCOUNTER — Encounter: Payer: Self-pay | Admitting: Family Medicine

## 2023-05-27 ENCOUNTER — Other Ambulatory Visit: Payer: Self-pay

## 2023-05-27 ENCOUNTER — Other Ambulatory Visit: Payer: Self-pay | Admitting: Family Medicine

## 2023-05-27 DIAGNOSIS — M79601 Pain in right arm: Secondary | ICD-10-CM

## 2023-05-27 DIAGNOSIS — M25521 Pain in right elbow: Secondary | ICD-10-CM

## 2023-05-27 DIAGNOSIS — K219 Gastro-esophageal reflux disease without esophagitis: Secondary | ICD-10-CM

## 2023-05-27 DIAGNOSIS — M25531 Pain in right wrist: Secondary | ICD-10-CM

## 2023-05-27 MED ORDER — GABAPENTIN 300 MG PO CAPS: 300.0000 mg | ORAL_CAPSULE | Freq: Three times a day (TID) | ORAL | 2 refills | Status: DC

## 2023-05-27 MED ORDER — GABAPENTIN 100 MG PO CAPS: 200.0000 mg | ORAL_CAPSULE | Freq: Every day | ORAL | 0 refills | Status: DC

## 2023-06-03 ENCOUNTER — Other Ambulatory Visit: Payer: Self-pay

## 2023-06-03 ENCOUNTER — Other Ambulatory Visit (HOSPITAL_BASED_OUTPATIENT_CLINIC_OR_DEPARTMENT_OTHER): Payer: Self-pay

## 2023-06-03 ENCOUNTER — Encounter: Payer: Self-pay | Admitting: "Endocrinology

## 2023-06-03 ENCOUNTER — Telehealth: Admitting: "Endocrinology

## 2023-06-03 DIAGNOSIS — Z7984 Long term (current) use of oral hypoglycemic drugs: Secondary | ICD-10-CM

## 2023-06-03 DIAGNOSIS — E782 Mixed hyperlipidemia: Secondary | ICD-10-CM | POA: Diagnosis not present

## 2023-06-03 DIAGNOSIS — E1140 Type 2 diabetes mellitus with diabetic neuropathy, unspecified: Secondary | ICD-10-CM | POA: Diagnosis not present

## 2023-06-03 DIAGNOSIS — Z7985 Long-term (current) use of injectable non-insulin antidiabetic drugs: Secondary | ICD-10-CM

## 2023-06-03 MED ORDER — METFORMIN HCL ER 750 MG PO TB24
750.0000 mg | ORAL_TABLET | Freq: Two times a day (BID) | ORAL | 1 refills | Status: AC
  Filled 2023-06-03 – 2023-06-06 (×2): qty 180, 90d supply, fill #0

## 2023-06-03 MED ORDER — TIRZEPATIDE 15 MG/0.5ML ~~LOC~~ SOAJ
15.0000 mg | SUBCUTANEOUS | 1 refills | Status: DC
  Filled 2023-06-03 – 2023-06-06 (×2): qty 2, 28d supply, fill #0
  Filled 2023-06-06: qty 6, 84d supply, fill #0
  Filled 2023-07-29 – 2023-08-18 (×3): qty 2, 28d supply, fill #1
  Filled 2023-09-12: qty 2, 28d supply, fill #2

## 2023-06-03 NOTE — Progress Notes (Signed)
 The patient reports they are currently: [name]. I spent 5 minutes on the video with the patient on the date of service. I spent an additional 5 minutes on pre- and post-visit activities on the date of service.   The patient was physically located in [name]  or a state in which I am permitted to provide care. The patient and/or parent/guardian understood that s/he may incur co-pays and cost sharing, and agreed to the telemedicine visit. The visit was reasonable and appropriate under the circumstances given the patient's presentation at the time.  The patient and/or parent/guardian has been advised of the potential risks and limitations of this mode of treatment (including, but not limited to, the absence of in-person examination) and has agreed to be treated using telemedicine. The patient's/patient's family's questions regarding telemedicine have been answered.   The patient and/or parent/guardian has also been advised to contact their provider's office for worsening conditions, and seek emergency medical treatment and/or call 911 if the patient deems either necessary.    Outpatient Endocrinology Note Katie Newcomer, MD  06/03/23   Katie [name] September 10, 1985 [id number]  Referring Provider: Aida House, MD Primary Care Provider: Aida House, MD Reason for consultation: Subjective   Assessment & Plan  Diagnoses and all orders for this visit:  Type 2 diabetes mellitus with diabetic neuropathy, without long-term current use of insulin  (HCC) -     tirzepatide  (MOUNJARO ) 15 MG/0.5ML Pen; Inject 15 mg into the skin once a week. -     metFORMIN  (GLUCOPHAGE -XR) 750 MG 24 hr tablet; Take 1 tablet (750 mg total) by mouth 2 (two) times daily before a meal.  Long term (current) use of oral hypoglycemic drugs  Long-term (current) use of injectable non-insulin  antidiabetic drugs  Mixed hypercholesterolemia and hypertriglyceridemia   Diabetes Type II complicated by  neuropathy  Lab Results  Component Value Date   GFR 115.39 04/01/2021   Hba1c goal less than 7, current Hba1c is  Lab Results  Component Value Date   HGBA1C 5.3 04/22/2023   Will recommend the following: Metformin  XR 750 mg bid-well tolerated Mounjaro  12.5 mg every day  DexCom not covered by insurance  Ordered DM education previously   No known contraindications/side effects to any of above medications No history of MEN syndrome/medullary thyroid cancer/pancreatitis or pancreatic cancer in self or family  -Last LD and Tg are as follows: Lab Results  Component Value Date   LDLCALC 61 12/19/2022    Lab Results  Component Value Date   TRIG 186 (H) 12/19/2022   -On lipitor 10 mg QD -Follow low fat diet and exercise   -Blood pressure goal <140/90 - Microalbumin/creatinine goal is < 30 -Last MA/Cr is as follows: Lab Results  Component Value Date   MICROALBUR 21.2 12/19/2022   -on ACE/ARB valsartan  80 mg qd -diet changes including salt restriction -limit eating outside -counseled BP targets per standards of diabetes care -uncontrolled blood pressure can lead to retinopathy, nephropathy and cardiovascular and atherosclerotic heart disease  Reviewed and counseled on: -A1C target -Blood sugar targets -Complications of uncontrolled diabetes  -Checking blood sugar before meals and bedtime and bring log next visit -All medications with mechanism of action and side effects -Hypoglycemia management: rule of 15's, Glucagon Emergency Kit and medical alert ID -low-carb low-fat plate-method diet -At least 20 minutes of physical activity per day -Annual dilated retinal eye exam and foot exam -compliance and follow up needs -follow up as scheduled or earlier if problem gets worse  Call  if blood sugar is less than 70 or consistently above 250    Take a 15 gm snack of carbohydrate at bedtime before you go to sleep if your blood sugar is less than 100.    If you are going to fast  after midnight for a test or procedure, ask your physician for instructions on how to reduce/decrease your insulin  dose.    Call if blood sugar is less than 70 or consistently above 250  -Treating a low sugar by rule of 15  (15 gms of sugar every 15 min until sugar is more than 70) If you feel your sugar is low, test your sugar to be sure If your sugar is low (less than 70), then take 15 grams of a fast acting Carbohydrate (3-4 glucose tablets or glucose gel or 4 ounces of juice or regular soda) Recheck your sugar 15 min after treating low to make sure it is more than 70 If sugar is still less than 70, treat again with 15 grams of carbohydrate          Don't drive the hour of hypoglycemia  If unconscious/unable to eat or drink by mouth, use glucagon injection or nasal spray baqsimi and call 911. Can repeat again in 15 min if still unconscious.  Return in about 4 months (around 10/04/2023).   I have reviewed current medications, nurse's notes, allergies, vital signs, past medical and surgical history, family medical history, and social history for this encounter. Counseled patient on symptoms, examination findings, lab findings, imaging results, treatment decisions and monitoring and prognosis. The patient understood the recommendations and agrees with the treatment plan. All questions regarding treatment plan were fully answered.  Katie Newcomer, MD  06/03/23   History of Present Illness Katie [name] is a 38 y.o. year old female who presents for follow up of Type II diabetes mellitus.  Katie [name] was first diagnosed in 2021 (had gestational diabetes 2007).   Diabetes education +  Home diabetes regimen: Metformin  XR 750 mg bid Mounjaro  12.5 mg every day   COMPLICATIONS -  MI/Stroke -  retinopathy +  neuropathy -  nephropathy  SYMPTOMS REVIEWED - Polyuria - Weight loss - Blurred vision  BLOOD SUGAR DATA Checks 1-2 times a day  Per recall range:  89-142  Physical Exam  There were no vitals taken for this visit.   Constitutional: well developed, well nourished Head: normocephalic, atraumatic Eyes: sclera anicteric, no redness Neck: supple Lungs: normal respiratory effort Neurology: alert and oriented Skin: dry, no appreciable rashes Musculoskeletal: no appreciable defects Psychiatric: normal mood and affect Diabetic Foot Exam - Simple   No data filed      Current Medications Patient's Medications  New Prescriptions   TIRZEPATIDE  (MOUNJARO ) 15 MG/0.5ML PEN    Inject 15 mg into the skin once a week.  Previous Medications   ACETAMINOPHEN  (TYLENOL ) 500 MG TABLET    Take 2 tablets (1,000 mg total) by mouth every 6 (six) hours.   ALBUTEROL  (VENTOLIN  HFA) 108 (90 BASE) MCG/ACT INHALER    Inhale 2 puffs into the lungs every 6 (six) hours as needed for wheezing or shortness of breath.   ATORVASTATIN  (LIPITOR) 10 MG TABLET    Take 1 tablet (10 mg total) by mouth daily.   BENZONATATE  (TESSALON ) 100 MG CAPSULE    Take 1-2 capsules (100-200 mg total) by mouth 3 (three) times daily as needed.   BLOOD GLUCOSE METER KIT AND SUPPLIES KIT    Use as directed  twice a day   BLOOD GLUCOSE MONITORING SUPPL (CONTOUR NEXT ONE) KIT    Use as directed   BROMPHENIRAMINE-PSEUDOEPHEDRINE-DM 30-2-10 MG/5ML SYRUP    Take 5 mLs by mouth 4 (four) times daily as needed.   BUSPIRONE  (BUSPAR ) 7.5 MG TABLET    Take 1 tablet (7.5 mg total) by mouth 3 (three) times daily.   CETIRIZINE (ZYRTEC) 10 MG TABLET    Take 10 mg by mouth daily.   DESVENLAFAXINE  (PRISTIQ ) 50 MG 24 HR TABLET    Take 1 tablet (50 mg total) by mouth daily.   FAMOTIDINE  (PEPCID ) 20 MG TABLET    TAKE 1 TABLET BY MOUTH EVERY MORNING AND TAKE 1 TABLET BY MOUTH EVERY NIGHT AT BEDTIME   FLUTICASONE  (FLONASE ) 50 MCG/ACT NASAL SPRAY    Place 2 sprays into both nostrils daily.   FUROSEMIDE  (LASIX ) 40 MG TABLET    Take 1 tablet (40 mg total) by mouth every other day.   GABAPENTIN  (NEURONTIN ) 100 MG  CAPSULE    Take 2 capsules (200 mg total) by mouth at bedtime.   GABAPENTIN  (NEURONTIN ) 300 MG CAPSULE    Take 1 capsule (300 mg total) by mouth 3 (three) times daily.   GLUCOSE BLOOD (CONTOUR NEXT TEST) TEST STRIP    Check 3 times a day   HYDROCHLOROTHIAZIDE  (HYDRODIURIL ) 25 MG TABLET    Take 1 tablet (25 mg total) by mouth daily.   HYDROXYZINE  (ATARAX ) 25 MG TABLET    Take 1 tablet (25 mg total) by mouth 3 (three) times daily as needed for anxiety.   IBUPROFEN  (ADVIL ) 800 MG TABLET    Take 1 tablet (800 mg total) by mouth every 8 (eight) hours as needed for moderate pain.   IPRATROPIUM (ATROVENT ) 0.03 % NASAL SPRAY    Place 2 sprays into both nostrils every 12 (twelve) hours.   MONTELUKAST  (SINGULAIR ) 10 MG TABLET    Take 1 tablet (10 mg total) by mouth at bedtime.   MUPIROCIN  OINTMENT (BACTROBAN ) 2 %    Apply 1 Application topically 2 (two) times daily.   NAPROXEN  (NAPROSYN ) 500 MG TABLET    TAKE 1 TABLET BY MOUTH 2 TIMES A DAY AS NEEDED FOR MODERATE PAIN   NITROGLYCERIN  (NITRODUR - DOSED IN MG/24 HR) 0.2 MG/HR PATCH    Place 1 patch (0.2 mg total) onto the skin daily. Place 1/4 of the patch over the affected area.   OMEPRAZOLE  (PRILOSEC) 40 MG CAPSULE    TAKE 1 CAPSULE BY MOUTH DAILY   ONDANSETRON  (ZOFRAN -ODT) 4 MG DISINTEGRATING TABLET    Take 1 tablet (4 mg total) by mouth every 8 (eight) hours as needed for nausea or vomiting.   ONDANSETRON  (ZOFRAN -ODT) 4 MG DISINTEGRATING TABLET    Take 1 tablet (4 mg total) by mouth every 8 (eight) hours as needed for nausea or vomiting.   VALSARTAN  (DIOVAN ) 80 MG TABLET    Take 1 tablet (80 mg total) by mouth 2 (two) times daily.   VENLAFAXINE XR (EFFEXOR-XR) 150 MG 24 HR CAPSULE    Take 150 mg by mouth daily with breakfast.  Modified Medications   Modified Medication Previous Medication   METFORMIN  (GLUCOPHAGE -XR) 750 MG 24 HR TABLET metFORMIN  (GLUCOPHAGE -XR) 750 MG 24 hr tablet      Take 1 tablet (750 mg total) by mouth 2 (two) times daily before a  meal.    TAKE 1 TABLET BY MOUTH EVERY MORNING AND TAKE 1 TABLET BY MOUTH EVERY NIGHT AT BEDTIME  Discontinued Medications  TIRZEPATIDE  (MOUNJARO ) 12.5 MG/0.5ML PEN    Inject 12.5 mg into the skin once a week.    Allergies Allergies  Allergen Reactions   Flexeril [Cyclobenzaprine Hcl] Shortness Of Breath   Lorazepam     Other reaction(s): Other Neck spasm    Past Medical History Past Medical History:  Diagnosis Date   ADHD (attention deficit hyperactivity disorder) 03/01/2011   Anxiety    Arthritis    neck    Asthma    as a child   Borderline personality disorder (HCC) 05/26/2019   Bronchospasm 01/19/2014   Formatting of this note might be different from the original. Prn albuterol , WARI   Chronic hypertension in pregnancy 06/05/2020   Chronic neck pain 04/04/2019   Complex atypical endometrial hyperplasia 02/24/2019   Depression    Fibromyalgia    Hepatic steatosis    Hypertension    Hypertension during pregnancy in third trimester 04/02/2020   IBS (irritable bowel syndrome)    Newly diagnosed diabetes (HCC) 03/09/2019   Obesity    Panic attack    Torticollis 01/19/2014   Valium  overdose     Past Surgical History Past Surgical History:  Procedure Laterality Date   big toes ingrown toenail removed     CESAREAN SECTION N/A 05/30/2020   Procedure: CESAREAN SECTION;  Surgeon: Terri Fester, MD;  Location: [facility];  Service: Obstetrics;  Laterality: N/A;   DILATATION & CURETTAGE/HYSTEROSCOPY WITH MYOSURE N/A 02/24/2019   Procedure: DILATATION & CURETTAGE/HYSTEROSCOPY WITH MYOSURE;  Surgeon: Terri Fester, MD;  Location: [facility];  Service: Gynecology;  Laterality: N/A;   MOUTH SURGERY     "baby teeth removed,adult teeth  pulled down"   RADIOLOGY WITH ANESTHESIA N/A 06/07/2019   Procedure: MRI CERVICAL SPINE WITHOUT CONTRAST;  Surgeon: Radiologist, Medication, MD;  Location: [facility] OR;  Service: Radiology;  Laterality: N/A;   wisdom teeth      Family  History family history includes Anxiety disorder in her father; Asthma in her daughter; COPD in her paternal grandmother; Depression in her father and mother; Hearing loss in her father; Heart attack in her paternal grandmother; Heart disease in her maternal grandfather and mother; Heart failure in her paternal grandmother; High blood pressure in her mother; Kidney disease in her mother; Learning disabilities in her daughter; Lung cancer in her paternal grandfather; Multiple sclerosis in her maternal grandmother; Other in her brother; Psychosis in her mother; Stroke in her paternal grandmother.  Social History Social History   Socioeconomic History   Marital status: Single    Spouse name: Not on file   Number of children: Not on file   Years of education: Not on file   Highest education level: Some college, no degree  Occupational History   Not on file  Tobacco Use   Smoking status: Former    Current packs/day: 0.00    Average packs/day: 0.5 packs/day for 4.0 years (2.0 ttl pk-yrs)    Types: Cigarettes    Start date: 01/13/2001    Quit date: 01/13/2005    Years since quitting: 18.3   Smokeless tobacco: Never  Vaping Use   Vaping status: Never Used  Substance and Sexual Activity   Alcohol use: Not Currently   Drug use: No   Sexual activity: Not Currently  Other Topics Concern   Not on file  Social History Narrative   Not on file   Social Drivers of Health   Financial Resource Strain: Medium Risk (05/08/2021)   Overall Financial Resource Strain (CARDIA)  Difficulty of Paying Living Expenses: Somewhat hard  Food Insecurity: Food Insecurity Present (05/08/2021)   Hunger Vital Sign    Worried About Running Out of Food in the Last Year: Sometimes true    Ran Out of Food in the Last Year: Sometimes true  Transportation Needs: No Transportation Needs (05/08/2021)   PRAPARE - Administrator, Civil Service (Medical): No    Lack of Transportation (Non-Medical): No  Physical  Activity: Sufficiently Active (05/08/2021)   Exercise Vital Sign    Days of Exercise per Week: 4 days    Minutes of Exercise per Session: 40 min  Stress: Stress Concern Present (05/08/2021)   [employer] of Occupational Health - Occupational Stress Questionnaire    Feeling of Stress : Very much  Social Connections: Socially Isolated (05/08/2021)   Social Connection and Isolation Panel [NHANES]    Frequency of Communication with Friends and Family: More than three times a week    Frequency of Social Gatherings with Friends and Family: Three times a week    Attends Religious Services: Never    Active Member of Clubs or Organizations: No    Attends Banker Meetings: Not on file    Marital Status: Never married  Intimate Partner Violence: Not on file    Lab Results  Component Value Date   HGBA1C 5.3 04/22/2023   HGBA1C 8.1 (H) 12/19/2022   HGBA1C 9.1 (A) 07/15/2022   Lab Results  Component Value Date   CHOL 119 12/19/2022   Lab Results  Component Value Date   HDL 33 (L) 12/19/2022   Lab Results  Component Value Date   LDLCALC 61 12/19/2022   Lab Results  Component Value Date   TRIG 186 (H) 12/19/2022   Lab Results  Component Value Date   CHOLHDL 3.6 12/19/2022   Lab Results  Component Value Date   CREATININE 0.73 12/19/2022   Lab Results  Component Value Date   GFR 115.39 04/01/2021   Lab Results  Component Value Date   MICROALBUR 21.2 12/19/2022      Component Value Date/Time   NA 142 12/19/2022 0927   NA 142 03/08/2021 1609   K 3.7 12/19/2022 0927   CL 103 12/19/2022 0927   CO2 30 12/19/2022 0927   GLUCOSE 177 (H) 12/19/2022 0927   BUN 12 12/19/2022 0927   BUN 17 03/08/2021 1609   CREATININE 0.73 12/19/2022 0927   CALCIUM  8.8 12/19/2022 0927   PROT 6.5 12/19/2022 0927   ALBUMIN 4.1 04/01/2021 1410   AST 54 (H) 12/19/2022 0927   ALT 38 (H) 12/19/2022 0927   ALKPHOS 45 04/01/2021 1410   BILITOT 0.4 12/19/2022 0927   GFRNONAA >60  06/07/2020 1746   GFRAA >60 07/15/2019 1402      Latest Ref Rng & Units 12/19/2022    9:27 AM 04/01/2021    2:10 PM 03/08/2021    4:09 PM  BMP  Glucose 65 - 99 mg/dL 161  096  85   BUN 7 - 25 mg/dL 12  17  17    Creatinine 0.50 - 0.97 mg/dL 0.45  4.09  8.11   BUN/Creat Ratio 6 - 22 (calc) SEE NOTE:   27   Sodium 135 - 146 mmol/L 142  138  142   Potassium 3.5 - 5.3 mmol/L 3.7  3.7  4.4   Chloride 98 - 110 mmol/L 103  103  101   CO2 20 - 32 mmol/L 30  26  25  Calcium  8.6 - 10.2 mg/dL 8.8  9.2  9.6        Component Value Date/Time   WBC 7.0 04/01/2021 1410   RBC 4.14 04/01/2021 1410   HGB 12.4 04/01/2021 1410   HGB 10.5 (L) 04/02/2020 1648   HCT 36.2 04/01/2021 1410   HCT 30.4 (L) 04/02/2020 1648   PLT 176.0 04/01/2021 1410   PLT 191 04/02/2020 1648   MCV 87.5 04/01/2021 1410   MCV 92 04/02/2020 1648   MCH 31.8 06/07/2020 1746   MCHC 34.3 04/01/2021 1410   RDW 14.2 04/01/2021 1410   RDW 13.0 04/02/2020 1648   LYMPHSABS 1.4 04/01/2021 1410   LYMPHSABS 1.2 04/02/2020 1648   MONOABS 0.4 04/01/2021 1410   EOSABS 0.1 04/01/2021 1410   EOSABS 0.1 04/02/2020 1648   BASOSABS 0.0 04/01/2021 1410   BASOSABS 0.0 04/02/2020 1648     Parts of this note may have been dictated using voice recognition software. There may be variances in spelling and vocabulary which are unintentional. Not all errors are proofread. Please notify the [facility] if any discrepancies are noted or if the meaning of any statement is not clear.

## 2023-06-04 ENCOUNTER — Encounter: Payer: Self-pay | Admitting: Family Medicine

## 2023-06-06 ENCOUNTER — Other Ambulatory Visit (HOSPITAL_BASED_OUTPATIENT_CLINIC_OR_DEPARTMENT_OTHER): Payer: Self-pay

## 2023-06-06 ENCOUNTER — Encounter (HOSPITAL_BASED_OUTPATIENT_CLINIC_OR_DEPARTMENT_OTHER): Payer: Self-pay

## 2023-06-09 ENCOUNTER — Other Ambulatory Visit (HOSPITAL_BASED_OUTPATIENT_CLINIC_OR_DEPARTMENT_OTHER): Payer: Self-pay

## 2023-06-16 ENCOUNTER — Telehealth: Payer: Self-pay | Admitting: Family Medicine

## 2023-06-16 NOTE — Telephone Encounter (Signed)
 Pt would like a work note to state no over head lifting of heavy items.   Pt can retrieve note from MyChart.

## 2023-06-22 ENCOUNTER — Ambulatory Visit: Admitting: Podiatry

## 2023-06-25 ENCOUNTER — Other Ambulatory Visit: Payer: Self-pay | Admitting: Family Medicine

## 2023-06-25 DIAGNOSIS — K219 Gastro-esophageal reflux disease without esophagitis: Secondary | ICD-10-CM

## 2023-06-25 NOTE — Telephone Encounter (Signed)
 Rx refill request approved per Dr. Zollie Pee orders.

## 2023-06-29 ENCOUNTER — Ambulatory Visit (INDEPENDENT_AMBULATORY_CARE_PROVIDER_SITE_OTHER)

## 2023-06-29 ENCOUNTER — Encounter: Payer: Self-pay | Admitting: Podiatry

## 2023-06-29 ENCOUNTER — Ambulatory Visit: Admitting: Podiatry

## 2023-06-29 ENCOUNTER — Telehealth: Admitting: Physician Assistant

## 2023-06-29 DIAGNOSIS — E1142 Type 2 diabetes mellitus with diabetic polyneuropathy: Secondary | ICD-10-CM | POA: Diagnosis not present

## 2023-06-29 DIAGNOSIS — J4521 Mild intermittent asthma with (acute) exacerbation: Secondary | ICD-10-CM | POA: Diagnosis not present

## 2023-06-29 DIAGNOSIS — Q828 Other specified congenital malformations of skin: Secondary | ICD-10-CM

## 2023-06-29 MED ORDER — PREDNISONE 20 MG PO TABS: 40.0000 mg | ORAL_TABLET | Freq: Every day | ORAL | 0 refills | Status: DC

## 2023-06-29 MED ORDER — BENZONATATE 100 MG PO CAPS: 100.0000 mg | ORAL_CAPSULE | Freq: Three times a day (TID) | ORAL | 0 refills | Status: DC | PRN

## 2023-06-29 MED ORDER — ALBUTEROL SULFATE HFA 108 (90 BASE) MCG/ACT IN AERS: 1.0000 | INHALATION_SPRAY | Freq: Four times a day (QID) | RESPIRATORY_TRACT | 0 refills | Status: AC | PRN

## 2023-06-29 NOTE — Progress Notes (Signed)
  Subjective:  Patient ID: Katie [name], female    DOB: 07-18-1985,   MRN: [id number]  Chief Complaint  Patient presents with   Foot Pain    Rm8 Painful leisons plantar left foot / 6 months sore with pressure/ shoe gear changes and soaks     38 y.o. female presents for concern of painful lesion on the bottom of her left foot. Requesting to have them trimmed today. Relates burning and tingling in their feet. Patient is diabetic and last A1c was  Lab Results  Component Value Date   HGBA1C 5.3 04/22/2023   .   PCP:  Aida House, MD    . Denies any other pedal complaints. Denies n/v/f/c.   Past Medical History:  Diagnosis Date   ADHD (attention deficit hyperactivity disorder) 03/01/2011   Anxiety    Arthritis    neck    Asthma    as a child   Borderline personality disorder (HCC) 05/26/2019   Bronchospasm 01/19/2014   Formatting of this note might be different from the original. Prn albuterol , WARI   Chronic hypertension in pregnancy 06/05/2020   Chronic neck pain 04/04/2019   Complex atypical endometrial hyperplasia 02/24/2019   Depression    Fibromyalgia    Hepatic steatosis    Hypertension    Hypertension during pregnancy in third trimester 04/02/2020   IBS (irritable bowel syndrome)    Newly diagnosed diabetes (HCC) 03/09/2019   Obesity    Panic attack    Torticollis 01/19/2014   Valium  overdose     Objective:  Physical Exam: Vascular: DP/PT pulses 2/4 bilateral. CFT <3 seconds. Normal hair growth on digits. No edema.  Skin. No lacerations or abrasions bilateral feet. Hyperkeratotic cored lesion noted to plantar left fifth metatarsal head with disruption of skin lines.  Musculoskeletal: MMT 5/5 bilateral lower extremities in DF, PF, Inversion and Eversion. Deceased ROM in DF of ankle joint.  Neurological: Sensation intact to light touch. Protective sensation intact.   Assessment:   1. Porokeratosis   2. Type 2 diabetes mellitus with peripheral neuropathy  (HCC)      Plan:  Patient was evaluated and treated and all questions answered. -Discussed and educated patient on diabetic foot care, especially with  regards to the vascular, neurological and musculoskeletal systems.  -Stressed the importance of good glycemic control and the detriment of not  controlling glucose levels in relation to the foot. -Discussed supportive shoes at all times and checking feet regularly.  -Mechanically debrided hyperkeratotic lesion on left foot with chisel withotu incident.  -Small amount of salycyclic acid applied as well.  -Answered all patient questions -Patient to return  in 3 months for at risk foot care -Patient advised to call the office if any problems or questions arise in the meantime.   Jennefer Moats, DPM

## 2023-06-29 NOTE — Progress Notes (Signed)

## 2023-07-08 ENCOUNTER — Telehealth: Admitting: Physician Assistant

## 2023-07-08 DIAGNOSIS — J019 Acute sinusitis, unspecified: Secondary | ICD-10-CM | POA: Diagnosis not present

## 2023-07-08 DIAGNOSIS — B9689 Other specified bacterial agents as the cause of diseases classified elsewhere: Secondary | ICD-10-CM | POA: Diagnosis not present

## 2023-07-08 MED ORDER — AMOXICILLIN-POT CLAVULANATE 875-125 MG PO TABS: 1.0000 | ORAL_TABLET | Freq: Two times a day (BID) | ORAL | 0 refills | Status: DC

## 2023-07-08 NOTE — Progress Notes (Signed)

## 2023-07-08 NOTE — Progress Notes (Signed)
 I have spent 5 minutes in review of e-visit questionnaire, review and updating patient chart, medical decision making and response to patient.   Piedad Climes, PA-C

## 2023-07-20 NOTE — Progress Notes (Deleted)
 [facility] [address] [place] [address] Phone: [phone] Subjective:    I'm seeing this patient by the request  of:  Ozell Heron HERO, MD  CC:   YEP:Dlagzrupcz  05/21/2023 Patient has a cyst noted approximately 2 cm proximal to the olecranon area of the elbow.  This seems to be in the muscle itself.  No abnormal blood flow.  Differential is quite broad and could be a neurofibroma, seems to be benign with no vascularity.  Do feel though we should get further imaging.  Patient is having enough pain that is affecting daily activities, affecting sleep as well.  Will get MRI with contrast of the humerus secondary to the positioning of this with the elbow may be missing it.  Follow-up with me again after imaging to discuss further.     Patient given injection today, given brace, home exercises.  Hopeful that this will make significant improvement.  He does have some cubital tunnel pain as well and we discussed compression sleeve.  Follow-up again in 6 to 8 weeks     Updated 07/23/2023 Leanna Hamid is a 38 y.o. female coming in with complaint of R arm pain      Past Medical History:  Diagnosis Date   ADHD (attention deficit hyperactivity disorder) 03/01/2011   Anxiety    Arthritis    neck    Asthma    as a child   Borderline personality disorder (HCC) 05/26/2019   Bronchospasm 01/19/2014   Formatting of this note might be different from the original. Prn albuterol , WARI   Chronic hypertension in pregnancy 06/05/2020   Chronic neck pain 04/04/2019   Complex atypical endometrial hyperplasia 02/24/2019   Depression    Fibromyalgia    Hepatic steatosis    Hypertension    Hypertension during pregnancy in third trimester 04/02/2020   IBS (irritable bowel syndrome)    Newly diagnosed diabetes (HCC) 03/09/2019   Obesity    Panic attack    Torticollis 01/19/2014   Valium  overdose    Past Surgical History:  Procedure Laterality Date    big toes ingrown toenail removed     CESAREAN SECTION N/A 05/30/2020   Procedure: CESAREAN SECTION;  Surgeon: Barbette Knock, MD;  Location: [facility];  Service: Obstetrics;  Laterality: N/A;   DILATATION & CURETTAGE/HYSTEROSCOPY WITH MYOSURE N/A 02/24/2019   Procedure: DILATATION & CURETTAGE/HYSTEROSCOPY WITH MYOSURE;  Surgeon: Barbette Knock, MD;  Location: [facility];  Service: Gynecology;  Laterality: N/A;   MOUTH SURGERY     baby teeth removed,adult teeth  pulled down   RADIOLOGY WITH ANESTHESIA N/A 06/07/2019   Procedure: MRI CERVICAL SPINE WITHOUT CONTRAST;  Surgeon: Radiologist, Medication, MD;  Location: [facility] OR;  Service: Radiology;  Laterality: N/A;   wisdom teeth     Social History   Socioeconomic History   Marital status: Single    Spouse name: Not on file   Number of children: Not on file   Years of education: Not on file   Highest education level: Some college, no degree  Occupational History   Not on file  Tobacco Use   Smoking status: Former    Current packs/day: 0.00    Average packs/day: 0.5 packs/day for 4.0 years (2.0 ttl pk-yrs)    Types: Cigarettes    Start date: 01/13/2001    Quit date: 01/13/2005    Years since quitting: 18.5   Smokeless tobacco: Never  Vaping Use   Vaping status:  Never Used  Substance and Sexual Activity   Alcohol use: Not Currently   Drug use: No   Sexual activity: Not Currently  Other Topics Concern   Not on file  Social History Narrative   Not on file   Social Drivers of Health   Financial Resource Strain: Medium Risk (05/08/2021)   Overall Financial Resource Strain (CARDIA)    Difficulty of Paying Living Expenses: Somewhat hard  Food Insecurity: Food Insecurity Present (05/08/2021)   Hunger Vital Sign    Worried About Running Out of Food in the Last Year: Sometimes true    Ran Out of Food in the Last Year: Sometimes true  Transportation Needs: No Transportation Needs (05/08/2021)   PRAPARE - Therapist, art (Medical): No    Lack of Transportation (Non-Medical): No  Physical Activity: Sufficiently Active (05/08/2021)   Exercise Vital Sign    Days of Exercise per Week: 4 days    Minutes of Exercise per Session: 40 min  Stress: Stress Concern Present (05/08/2021)   [employer] of Occupational Health - Occupational Stress Questionnaire    Feeling of Stress : Very much  Social Connections: Socially Isolated (05/08/2021)   Social Connection and Isolation Panel    Frequency of Communication with Friends and Family: More than three times a week    Frequency of Social Gatherings with Friends and Family: Three times a week    Attends Religious Services: Never    Active Member of Clubs or Organizations: No    Attends Engineer, structural: Not on file    Marital Status: Never married   Allergies  Allergen Reactions   Flexeril [Cyclobenzaprine Hcl] Shortness Of Breath   Lorazepam     Other reaction(s): Other Neck spasm   Family History  Problem Relation Age of Onset   Heart disease Mother    Depression Mother    High blood pressure Mother    Kidney disease Mother    Psychosis Mother    Hearing loss Father    Depression Father    Anxiety disorder Father    Asthma Daughter    Learning disabilities Daughter    Heart disease Maternal Grandfather    Other Brother        fatty liver   Multiple sclerosis Maternal Grandmother    COPD Paternal Grandmother    Heart failure Paternal Grandmother    Heart attack Paternal Grandmother    Stroke Paternal Grandmother    Lung cancer Paternal Grandfather     Current Outpatient Medications (Endocrine & Metabolic):    metFORMIN  (GLUCOPHAGE -XR) 750 MG 24 hr tablet, Take 1 tablet (750 mg total) by mouth 2 (two) times daily before a meal.   predniSONE  (DELTASONE ) 20 MG tablet, Take 2 tablets (40 mg total) by mouth daily with breakfast.   tirzepatide  (MOUNJARO ) 15 MG/0.5ML Pen, Inject 15 mg into the skin once a  week.  Current Outpatient Medications (Cardiovascular):    atorvastatin  (LIPITOR) 10 MG tablet, Take 1 tablet (10 mg total) by mouth daily.   furosemide  (LASIX ) 40 MG tablet, Take 1 tablet (40 mg total) by mouth every other day.   hydrochlorothiazide  (HYDRODIURIL ) 25 MG tablet, Take 1 tablet (25 mg total) by mouth daily.   nitroGLYCERIN  (NITRODUR - DOSED IN MG/24 HR) 0.2 mg/hr patch, Place 1 patch (0.2 mg total) onto the skin daily. Place 1/4 of the patch over the affected area.   valsartan  (DIOVAN ) 80 MG tablet, Take 1 tablet (80 mg total)  by mouth 2 (two) times daily.  Current Outpatient Medications (Respiratory):    albuterol  (VENTOLIN  HFA) 108 (90 Base) MCG/ACT inhaler, Inhale 1-2 puffs into the lungs every 6 (six) hours as needed.   benzonatate  (TESSALON ) 100 MG capsule, Take 1-2 capsules (100-200 mg total) by mouth 3 (three) times daily as needed.   brompheniramine-pseudoephedrine-DM 30-2-10 MG/5ML syrup, Take 5 mLs by mouth 4 (four) times daily as needed.   cetirizine (ZYRTEC) 10 MG tablet, Take 10 mg by mouth daily.   fluticasone  (FLONASE ) 50 MCG/ACT nasal spray, Place 2 sprays into both nostrils daily.   ipratropium (ATROVENT ) 0.03 % nasal spray, Place 2 sprays into both nostrils every 12 (twelve) hours.   montelukast  (SINGULAIR ) 10 MG tablet, Take 1 tablet (10 mg total) by mouth at bedtime.  Current Outpatient Medications (Analgesics):    acetaminophen  (TYLENOL ) 500 MG tablet, Take 2 tablets (1,000 mg total) by mouth every 6 (six) hours. (Patient taking differently: Take 1,000 mg by mouth as needed.)   ibuprofen  (ADVIL ) 800 MG tablet, Take 1 tablet (800 mg total) by mouth every 8 (eight) hours as needed for moderate pain.   naproxen  (NAPROSYN ) 500 MG tablet, TAKE 1 TABLET BY MOUTH 2 TIMES A DAY AS NEEDED FOR MODERATE PAIN   Current Outpatient Medications (Other):    amoxicillin -clavulanate (AUGMENTIN ) 875-125 MG tablet, Take 1 tablet by mouth 2 (two) times daily.   blood glucose  meter kit and supplies KIT, Use as directed twice a day   Blood Glucose Monitoring Suppl (CONTOUR NEXT ONE) KIT, Use as directed   busPIRone  (BUSPAR ) 7.5 MG tablet, Take 1 tablet (7.5 mg total) by mouth 3 (three) times daily.   desvenlafaxine  (PRISTIQ ) 50 MG 24 hr tablet, Take 1 tablet (50 mg total) by mouth daily.   famotidine  (PEPCID ) 20 MG tablet, TAKE 1 TABLET BY MOUTH EVERY MORNING AND TAKE 1 TABLET BY MOUTH EVERY NIGHT AT BEDTIME   gabapentin  (NEURONTIN ) 100 MG capsule, TAKE 2 CAPSULES BY MOUTH AT BEDTIME   gabapentin  (NEURONTIN ) 300 MG capsule, Take 1 capsule (300 mg total) by mouth 3 (three) times daily.   glucose blood (CONTOUR NEXT TEST) test strip, Check 3 times a day   hydrOXYzine  (ATARAX ) 25 MG tablet, Take 1 tablet (25 mg total) by mouth 3 (three) times daily as needed for anxiety.   mupirocin  ointment (BACTROBAN ) 2 %, Apply 1 Application topically 2 (two) times daily.   omeprazole  (PRILOSEC) 40 MG capsule, TAKE 1 CAPSULE BY MOUTH DAILY   ondansetron  (ZOFRAN -ODT) 4 MG disintegrating tablet, Take 1 tablet (4 mg total) by mouth every 8 (eight) hours as needed for nausea or vomiting.   ondansetron  (ZOFRAN -ODT) 4 MG disintegrating tablet, Take 1 tablet (4 mg total) by mouth every 8 (eight) hours as needed for nausea or vomiting.   venlafaxine XR (EFFEXOR-XR) 150 MG 24 hr capsule, Take 150 mg by mouth daily with breakfast.   Reviewed prior external information including notes and imaging from  primary care provider As well as notes that were available from care everywhere and other healthcare systems.  Past medical history, social, surgical and family history all reviewed in electronic medical record.  No pertanent information unless stated regarding to the chief complaint.   Review of Systems:  No headache, visual changes, nausea, vomiting, diarrhea, constipation, dizziness, abdominal pain, skin rash, fevers, chills, night sweats, weight loss, swollen lymph nodes, body aches, joint  swelling, chest pain, shortness of breath, mood changes. POSITIVE muscle aches  Objective  unknown if currently  breastfeeding.   General: No apparent distress alert and oriented x3 mood and affect normal, dressed appropriately.  HEENT: Pupils equal, extraocular movements intact  Respiratory: Patient's speak in full sentences and does not appear short of breath  Cardiovascular: No lower extremity edema, non tender, no erythema      Impression and Recommendations:

## 2023-07-23 ENCOUNTER — Ambulatory Visit: Admitting: Family Medicine

## 2023-07-25 ENCOUNTER — Other Ambulatory Visit: Payer: Self-pay | Admitting: Family Medicine

## 2023-07-25 DIAGNOSIS — K219 Gastro-esophageal reflux disease without esophagitis: Secondary | ICD-10-CM

## 2023-07-27 NOTE — Telephone Encounter (Signed)
 Last OV 05/21/23 Next OV 07/23/23 - canceled by patient  Last refill 06/25/23 Qty #60/0

## 2023-07-29 ENCOUNTER — Other Ambulatory Visit (HOSPITAL_BASED_OUTPATIENT_CLINIC_OR_DEPARTMENT_OTHER): Payer: Self-pay

## 2023-07-31 ENCOUNTER — Ambulatory Visit: Admitting: Cardiology

## 2023-08-04 ENCOUNTER — Other Ambulatory Visit (HOSPITAL_BASED_OUTPATIENT_CLINIC_OR_DEPARTMENT_OTHER): Payer: Self-pay

## 2023-08-05 ENCOUNTER — Other Ambulatory Visit (HOSPITAL_BASED_OUTPATIENT_CLINIC_OR_DEPARTMENT_OTHER): Payer: Self-pay

## 2023-08-05 ENCOUNTER — Other Ambulatory Visit: Payer: Self-pay

## 2023-08-11 ENCOUNTER — Ambulatory Visit: Admitting: Cardiology

## 2023-08-12 ENCOUNTER — Encounter: Payer: Self-pay | Admitting: Cardiology

## 2023-08-12 ENCOUNTER — Encounter: Payer: Self-pay | Admitting: Family Medicine

## 2023-08-13 ENCOUNTER — Telehealth: Payer: Self-pay | Admitting: "Endocrinology

## 2023-08-13 ENCOUNTER — Encounter: Payer: Self-pay | Admitting: Physician Assistant

## 2023-08-13 ENCOUNTER — Other Ambulatory Visit (HOSPITAL_COMMUNITY): Payer: Self-pay

## 2023-08-13 ENCOUNTER — Other Ambulatory Visit (HOSPITAL_BASED_OUTPATIENT_CLINIC_OR_DEPARTMENT_OTHER): Payer: Self-pay

## 2023-08-13 NOTE — Telephone Encounter (Signed)
 Patient called stating she is out of her Mounjaro  15mg ,and has been for over month.She is stating there is a PA needed and has heard nothing.Please advise.

## 2023-08-14 ENCOUNTER — Telehealth: Payer: Self-pay

## 2023-08-14 NOTE — Telephone Encounter (Signed)
 Please see msg from patient, not showing you are her PCP and only seen once in 2024.

## 2023-08-14 NOTE — Telephone Encounter (Signed)
 Lvm for pt to call back regarding PA

## 2023-08-14 NOTE — Telephone Encounter (Signed)
 Pt needs a PA done for Ozmepic 15 mg. Thanks.

## 2023-08-17 ENCOUNTER — Ambulatory Visit (INDEPENDENT_AMBULATORY_CARE_PROVIDER_SITE_OTHER): Admitting: Family Medicine

## 2023-08-17 ENCOUNTER — Other Ambulatory Visit (HOSPITAL_COMMUNITY): Payer: Self-pay

## 2023-08-17 ENCOUNTER — Encounter: Payer: Self-pay | Admitting: Family Medicine

## 2023-08-17 ENCOUNTER — Telehealth: Payer: Self-pay

## 2023-08-17 VITALS — BP 116/70 | HR 90 | Temp 98.1°F | Ht 66.0 in | Wt 246.9 lb

## 2023-08-17 DIAGNOSIS — Z0000 Encounter for general adult medical examination without abnormal findings: Secondary | ICD-10-CM

## 2023-08-17 DIAGNOSIS — E559 Vitamin D deficiency, unspecified: Secondary | ICD-10-CM | POA: Diagnosis not present

## 2023-08-17 DIAGNOSIS — R400 Somnolence: Secondary | ICD-10-CM

## 2023-08-17 DIAGNOSIS — F4541 Pain disorder exclusively related to psychological factors: Secondary | ICD-10-CM

## 2023-08-17 DIAGNOSIS — M797 Fibromyalgia: Secondary | ICD-10-CM

## 2023-08-17 LAB — VITAMIN B12: Vitamin B-12: 296 pg/mL (ref 211–911)

## 2023-08-17 LAB — VITAMIN D 25 HYDROXY (VIT D DEFICIENCY, FRACTURES): VITD: 15.06 ng/mL — ABNORMAL LOW (ref 30.00–100.00)

## 2023-08-17 MED ORDER — BUTALBITAL-APAP-CAFFEINE 50-325-40 MG PO TABS: 1.0000 | ORAL_TABLET | Freq: Four times a day (QID) | ORAL | 0 refills | Status: AC | PRN

## 2023-08-17 MED ORDER — NAPROXEN 500 MG PO TABS
ORAL_TABLET | ORAL | 5 refills | Status: AC
  Filled 2023-12-15 – 2024-01-11 (×3): qty 60, 30d supply, fill #0

## 2023-08-17 NOTE — Telephone Encounter (Signed)
 Pharmacy Patient Advocate Encounter   Received notification from Pt Calls Messages that prior authorization for Mounjaro  15mg  is required/requested.   Insurance verification completed.   The patient is insured through [facility] .   Per test claim: The current 90 day co-pay is, $25.  No PA needed at this time. This test claim was processed through [facility]- copay amounts may vary at other pharmacies due to pharmacy/plan contracts, or as the patient moves through the different stages of their insurance plan.

## 2023-08-17 NOTE — Progress Notes (Unsigned)
 Complete physical exam  Patient: Katie [name]   DOB: Jul 16, 1985   38 y.o. Female  MRN: [id number]  Subjective:    Chief Complaint  Patient presents with   Headache    Patient complains of headaches-left temporal and eye pain x1 week    Katie [name] is a 38 y.o. female who presents today for a complete physical exam. She reports consuming a general diet. Isn't really getting good fruits and veggies, states that things are more expensive, works 3 jobs and doesn't have a lot of time to [employer]. Will try to limit bread but it is difficult. The patient has a physically strenuous job, but has no regular exercise apart from work.  She generally feels well. She reports sleeping somewhat poorly, only getting about 5 hours, still feels exhausted when she wakes up. She does have additional problems to discuss today.   Patient is reporting a new symptom of headaches. States that she is waking up with the headaches in the temporal areas. States that she is getting dizzy with the headaches sometimes, but no changes in her vision. States she saw her eye doctor about 1 year ago. Feels like a pointed ache, pulsating somewhat.  Has take ibuprofen  and tylenol  without much help. States that she also took them together and it did not help. States she was given a different medication for her headaches in the past, butalbital  which has helped in the past. No other associated symptoms that the patient is reporting.   Most recent fall risk assessment:    03/09/2019    3:47 PM  Fall Risk   Falls in the past year? 0      Data saved with a previous flowsheet row definition     Most recent depression screenings:    05/08/2021    4:39 PM 12/19/2020    3:21 PM  PHQ 2/9 Scores  PHQ - 2 Score 2 3  PHQ- 9 Score 11 15    Vision:Within last year and Dental: No current dental problems and Last dental visit: 1 year visit  Patient Active Problem List   Diagnosis Date Noted   Right carpal tunnel  syndrome 05/21/2023   Arm mass, left 05/21/2023   PCOS (polycystic ovarian syndrome) 07/15/2022   Gastroesophageal reflux disease without esophagitis 07/15/2022   Valium  overdose    Panic attack    Obesity    IBS (irritable bowel syndrome)    Hypertension    Hepatic steatosis    Fibromyalgia    Asthma    Arthritis    Anxiety    Acute blood loss anemia (ABLA) 05/31/2020   Status post primary low transverse cesarean section FTP 5/18 05/30/2020   Postpartum care following cesarean delivery 5/18 05/30/2020   Delivery by emergency cesarean 05/30/2020   Encounter for induction of labor 05/29/2020   Type 2 diabetes mellitus without complication, without long-term current use of insulin  (HCC) 04/02/2020   Morbid obesity (HCC) 04/02/2020   Depression    Borderline personality disorder (HCC) 05/26/2019   Chronic neck pain 04/04/2019   Complex atypical endometrial hyperplasia 02/24/2019   Torticollis 01/19/2014   Bronchospasm 01/19/2014   ADHD (attention deficit hyperactivity disorder) 03/01/2011      Patient Care Team: Ozell Heron HERO, MD as PCP - General (Family Medicine) Tobb, Kardie, DO as PCP - Cardiology (Cardiology) Vincente Grip, MD as Consulting Physician (Psychiatry) Barbette Knock, MD as Consulting Physician (Obstetrics and Gynecology)   Outpatient Medications Prior to Visit  Medication Sig  acetaminophen  (TYLENOL ) 500 MG tablet Take 2 tablets (1,000 mg total) by mouth every 6 (six) hours.   albuterol  (VENTOLIN  HFA) 108 (90 Base) MCG/ACT inhaler Inhale 1-2 puffs into the lungs every 6 (six) hours as needed.   atorvastatin  (LIPITOR) 10 MG tablet Take 1 tablet (10 mg total) by mouth daily.   blood glucose meter kit and supplies KIT Use as directed twice a day   Blood Glucose Monitoring Suppl (CONTOUR NEXT ONE) KIT Use as directed   busPIRone  (BUSPAR ) 30 MG tablet Take 30 mg by mouth daily as needed.   cetirizine (ZYRTEC) 10 MG tablet Take 10 mg by mouth daily.    famotidine  (PEPCID ) 20 MG tablet TAKE 1 TABLET BY MOUTH EVERY MORNING AND TAKE 1 TABLET BY MOUTH EVERY NIGHT AT BEDTIME   furosemide  (LASIX ) 40 MG tablet Take 1 tablet (40 mg total) by mouth every other day.   gabapentin  (NEURONTIN ) 100 MG capsule TAKE 2 CAPSULES BY MOUTH AT BEDTIME   glucose blood (CONTOUR NEXT TEST) test strip Check 3 times a day   hydrochlorothiazide  (HYDRODIURIL ) 25 MG tablet Take 1 tablet (25 mg total) by mouth daily.   hydrOXYzine  (ATARAX ) 25 MG tablet Take 1 tablet (25 mg total) by mouth 3 (three) times daily as needed for anxiety.   levonorgestrel (MIRENA) 20 MCG/DAY IUD 1 each by Intrauterine route once.   metFORMIN  (GLUCOPHAGE -XR) 750 MG 24 hr tablet Take 1 tablet (750 mg total) by mouth 2 (two) times daily before a meal.   mupirocin  ointment (BACTROBAN ) 2 % Apply 1 Application topically 2 (two) times daily.   omeprazole  (PRILOSEC) 40 MG capsule TAKE 1 CAPSULE BY MOUTH DAILY **PLEASE SCHEDULE APPOINTMENT FOR REFILLS**   ondansetron  (ZOFRAN -ODT) 4 MG disintegrating tablet Take 1 tablet (4 mg total) by mouth every 8 (eight) hours as needed for nausea or vomiting.   ondansetron  (ZOFRAN -ODT) 4 MG disintegrating tablet Take 1 tablet (4 mg total) by mouth every 8 (eight) hours as needed for nausea or vomiting.   tirzepatide  (MOUNJARO ) 15 MG/0.5ML Pen Inject 15 mg into the skin once a week.   valsartan  (DIOVAN ) 80 MG tablet Take 1 tablet (80 mg total) by mouth 2 (two) times daily.   venlafaxine XR (EFFEXOR-XR) 150 MG 24 hr capsule Take 150 mg by mouth daily with breakfast.   [DISCONTINUED] amoxicillin -clavulanate (AUGMENTIN ) 875-125 MG tablet Take 1 tablet by mouth 2 (two) times daily.   [DISCONTINUED] benzonatate  (TESSALON ) 100 MG capsule Take 1-2 capsules (100-200 mg total) by mouth 3 (three) times daily as needed.   [DISCONTINUED] brompheniramine-pseudoephedrine-DM 30-2-10 MG/5ML syrup Take 5 mLs by mouth 4 (four) times daily as needed.   [DISCONTINUED] busPIRone  (BUSPAR ) 7.5  MG tablet Take 1 tablet (7.5 mg total) by mouth 3 (three) times daily.   [DISCONTINUED] desvenlafaxine  (PRISTIQ ) 50 MG 24 hr tablet Take 1 tablet (50 mg total) by mouth daily.   [DISCONTINUED] fluticasone  (FLONASE ) 50 MCG/ACT nasal spray Place 2 sprays into both nostrils daily.   [DISCONTINUED] gabapentin  (NEURONTIN ) 300 MG capsule Take 1 capsule (300 mg total) by mouth 3 (three) times daily.   [DISCONTINUED] ibuprofen  (ADVIL ) 800 MG tablet Take 1 tablet (800 mg total) by mouth every 8 (eight) hours as needed for moderate pain.   [DISCONTINUED] ipratropium (ATROVENT ) 0.03 % nasal spray Place 2 sprays into both nostrils every 12 (twelve) hours.   [DISCONTINUED] montelukast  (SINGULAIR ) 10 MG tablet Take 1 tablet (10 mg total) by mouth at bedtime.   [DISCONTINUED] naproxen  (NAPROSYN ) 500 MG tablet TAKE  1 TABLET BY MOUTH 2 TIMES A DAY AS NEEDED FOR MODERATE PAIN   [DISCONTINUED] nitroGLYCERIN  (NITRODUR - DOSED IN MG/24 HR) 0.2 mg/hr patch Place 1 patch (0.2 mg total) onto the skin daily. Place 1/4 of the patch over the affected area.   [DISCONTINUED] predniSONE  (DELTASONE ) 20 MG tablet Take 2 tablets (40 mg total) by mouth daily with breakfast.   No facility-administered medications prior to visit.    Review of Systems  HENT:  Negative for hearing loss.   Eyes:  Negative for blurred vision.  Respiratory:  Negative for shortness of breath.   Cardiovascular:  Negative for chest pain.  Gastrointestinal: Negative.   Genitourinary: Negative.   Musculoskeletal:  Negative for back pain.  Neurological:  Negative for headaches.  Psychiatric/Behavioral:  Negative for depression.        Objective:     BP 116/70   Pulse 90   Temp 98.1 F (36.7 C) (Oral)   Ht 5' 6 (1.676 m)   Wt 246 lb 14.4 oz (112 kg)   SpO2 95%   BMI 39.85 kg/m  {Vitals History (Optional):23777}  Physical Exam Vitals reviewed.  Constitutional:      Appearance: Normal appearance. She is well-groomed. She is obese.   HENT:     Right Ear: Tympanic membrane and ear canal normal.     Left Ear: Tympanic membrane and ear canal normal.     Mouth/Throat:     Mouth: Mucous membranes are moist.     Pharynx: No posterior oropharyngeal erythema.  Eyes:     Conjunctiva/sclera: Conjunctivae normal.  Neck:     Thyroid: No thyromegaly.  Cardiovascular:     Rate and Rhythm: Normal rate and regular rhythm.     Pulses: Normal pulses.     Heart sounds: S1 normal and S2 normal.  Pulmonary:     Effort: Pulmonary effort is normal.     Breath sounds: Normal breath sounds and air entry.  Abdominal:     General: Abdomen is flat. Bowel sounds are normal.     Palpations: Abdomen is soft.  Musculoskeletal:     Right lower leg: No edema.     Left lower leg: No edema.  Lymphadenopathy:     Cervical: No cervical adenopathy.  Neurological:     Mental Status: She is alert and oriented to person, place, and time. Mental status is at baseline.     Gait: Gait is intact.  Psychiatric:        Mood and Affect: Mood and affect normal.        Speech: Speech normal.        Behavior: Behavior normal.        Judgment: Judgment normal.      No results found for any visits on 08/17/23. {Show previous labs (optional):23779}    Assessment & Plan:    Routine Health Maintenance and Physical Exam  Immunization History  Administered Date(s) Administered   Influenza,inj,Quad PF,6+ Mos 10/03/2020   Influenza-Unspecified 08/14/2018   PFIZER(Purple Top)SARS-COV-2 Vaccination 03/27/2019, 04/18/2019, 11/19/2019   Tdap 08/02/2020    Health Maintenance  Topic Date Due   Hepatitis C Screening  Never done   Pneumococcal Vaccine: 19-49 Years (1 of 2 - PCV) Never done   HPV VACCINES (1 - 3-dose SCDM series) Never done   Cervical Cancer Screening (HPV/Pap Cotest)  01/23/2022   COVID-19 Vaccine (5 - 2024-25 season) 09/14/2022   OPHTHALMOLOGY EXAM  07/18/2023   INFLUENZA VACCINE  08/14/2023   HEMOGLOBIN A1C  10/22/2023   Diabetic  kidney evaluation - eGFR measurement  12/19/2023   Diabetic kidney evaluation - Urine ACR  12/19/2023   FOOT EXAM  12/22/2023   DTaP/Tdap/Td (2 - Td or Tdap) 08/03/2030   Hepatitis B Vaccines  Completed   HIV Screening  Completed   Meningococcal B Vaccine  Aged Out    Discussed health benefits of physical activity, and encouraged her to engage in regular exercise appropriate for her age and condition.  Routine adult health maintenance  Daytime somnolence -     Ambulatory referral to Sleep Studies  Fibromyalgia -     Naproxen ; TAKE 1 TABLET BY MOUTH 2 TIMES A DAY AS NEEDED FOR MODERATE PAIN  Dispense: 60 tablet; Refill: 5 -     Vitamin B12; Future  Stress headaches -     Iron , TIBC and Ferritin Panel; Future -     Butalbital -APAP-Caffeine ; Take 1 tablet by mouth every 6 (six) hours as needed for headache.  Dispense: 14 tablet; Refill: 0  Vitamin D  deficiency -     VITAMIN D  25 Hydroxy (Vit-D Deficiency, Fractures); Future    No follow-ups on file.     Heron CHRISTELLA Sharper, MD

## 2023-08-18 ENCOUNTER — Other Ambulatory Visit (HOSPITAL_BASED_OUTPATIENT_CLINIC_OR_DEPARTMENT_OTHER): Payer: Self-pay

## 2023-08-18 ENCOUNTER — Ambulatory Visit: Payer: Self-pay | Admitting: Family Medicine

## 2023-08-18 DIAGNOSIS — E559 Vitamin D deficiency, unspecified: Secondary | ICD-10-CM

## 2023-08-18 LAB — IRON,TIBC AND FERRITIN PANEL
%SAT: 11 % — ABNORMAL LOW (ref 16–45)
Ferritin: 44 ng/mL (ref 16–154)
Iron: 38 ug/dL — ABNORMAL LOW (ref 40–190)
TIBC: 348 ug/dL (ref 250–450)

## 2023-08-18 MED ORDER — VITAMIN D (ERGOCALCIFEROL) 1.25 MG (50000 UNIT) PO CAPS: 50000.0000 [IU] | ORAL_CAPSULE | ORAL | 1 refills | Status: DC

## 2023-08-19 NOTE — Assessment & Plan Note (Signed)
 Headaches could be related to OSA, she is at risk for this. I recommended continuing naproxen  PRN for her headaches and will order a home sleep study to rule out OSA.

## 2023-08-21 ENCOUNTER — Ambulatory Visit: Admitting: Family Medicine

## 2023-08-27 ENCOUNTER — Other Ambulatory Visit: Payer: Self-pay | Admitting: Family Medicine

## 2023-08-27 DIAGNOSIS — K219 Gastro-esophageal reflux disease without esophagitis: Secondary | ICD-10-CM

## 2023-08-28 ENCOUNTER — Telehealth: Admitting: Physician Assistant

## 2023-08-28 DIAGNOSIS — A084 Viral intestinal infection, unspecified: Secondary | ICD-10-CM

## 2023-08-28 MED ORDER — ONDANSETRON 4 MG PO TBDP: 4.0000 mg | ORAL_TABLET | Freq: Three times a day (TID) | ORAL | 0 refills | Status: AC | PRN

## 2023-08-28 NOTE — Progress Notes (Signed)

## 2023-09-12 ENCOUNTER — Other Ambulatory Visit (HOSPITAL_BASED_OUTPATIENT_CLINIC_OR_DEPARTMENT_OTHER): Payer: Self-pay

## 2023-10-13 ENCOUNTER — Other Ambulatory Visit: Payer: Self-pay | Admitting: Cardiology

## 2023-10-15 ENCOUNTER — Other Ambulatory Visit: Payer: Self-pay | Admitting: Cardiology

## 2023-10-15 MED ORDER — FUROSEMIDE 40 MG PO TABS: 40.0000 mg | ORAL_TABLET | ORAL | 0 refills | Status: DC

## 2023-10-21 ENCOUNTER — Telehealth: Admitting: Physician Assistant

## 2023-10-21 ENCOUNTER — Encounter: Payer: Self-pay | Admitting: Cardiology

## 2023-10-21 DIAGNOSIS — L239 Allergic contact dermatitis, unspecified cause: Secondary | ICD-10-CM

## 2023-10-21 MED ORDER — PREDNISONE 10 MG PO TABS: ORAL_TABLET | ORAL | 0 refills | Status: AC

## 2023-10-21 MED ORDER — TRIAMCINOLONE ACETONIDE 0.1 % EX CREA: 1.0000 | TOPICAL_CREAM | Freq: Two times a day (BID) | CUTANEOUS | 0 refills | Status: AC

## 2023-10-21 NOTE — Addendum Note (Signed)
 Addended by: GLADIS ELSIE BROCKS on: 10/21/2023 08:58 AM   Modules accepted: Orders

## 2023-10-21 NOTE — Progress Notes (Signed)
 E Visit for Rash  We are sorry that you are not feeling well. Here is how we plan to help!  Based on what you shared with me it looks like you have contact dermatitis.  Contact dermatitis is a skin rash caused by something that touches the skin and causes irritation or inflammation.  Your skin may be red, swollen, dry, cracked, and itch.  The rash should go away in a few days but can last a few weeks.  If you get a rash, it's important to figure out what caused it so the irritant can be avoided in the future. and I am prescribing a two week course of steroids (37 tablets of 10 mg prednisone ).  Days 1-4 take 4 tablets (40 mg) daily  Days 5-8 take 3 tablets (30 mg) daily, Days 9-11 take 2 tablets (20 mg) daily, Days 12-14 take 1 tablet (10 mg) daily.    HOME CARE:  Take cool showers and avoid direct sunlight. Apply cool compress or wet dressings. Take a bath in an oatmeal bath.  Sprinkle content of one Aveeno packet under running faucet with comfortably warm water .  Bathe for 15-20 minutes, 1-2 times daily.  Pat dry with a towel. Do not rub the rash. Use hydrocortisone cream. Take an antihistamine like Benadryl  for widespread rashes that itch.  The adult dose of Benadryl  is 25-50 mg by mouth 4 times daily. Caution:  This type of medication may cause sleepiness.  Do not drink alcohol, drive, or operate dangerous machinery while taking antihistamines.  Do not take these medications if you have prostate enlargement.  Read package instructions thoroughly on all medications that you take.  GET HELP RIGHT AWAY IF:  Symptoms don't go away after treatment. Severe itching that persists. If you rash spreads or swells. If you rash begins to smell. If it blisters and opens or develops a yellow-brown crust. You develop a fever. You have a sore throat. You become short of breath.  MAKE SURE YOU:  Understand these instructions. Will watch your condition. Will get help right away if you are not doing  well or get worse.  Thank you for choosing an e-visit. Your e-visit answers were reviewed by a board certified advanced clinical practitioner to complete your personal care plan. Depending upon the condition, your plan could have included both over the counter or prescription medications. Please review your pharmacy choice. Be sure that the pharmacy you have chosen is open so that you can pick up your prescription now.  If there is a problem you may message your provider in MyChart to have the prescription routed to another pharmacy. Your safety is important to us . If you have drug allergies check your prescription carefully.  For the next 24 hours, you can use MyChart to ask questions about today's visit, request a non-urgent call back, or ask for a work or school excuse from your e-visit provider. You will get an email in the next two days asking about your experience. I hope that your e-visit has been valuable and will speed your recovery.  I have spent 5 minutes in review of e-visit questionnaire, review and updating patient chart, medical decision making and response to patient.   Delon CHRISTELLA Dickinson, PA-C

## 2023-10-23 MED ORDER — HYDROXYZINE HCL 10 MG PO TABS: 10.0000 mg | ORAL_TABLET | Freq: Three times a day (TID) | ORAL | 0 refills | Status: AC | PRN

## 2023-10-23 NOTE — Addendum Note (Signed)
 Addended by: VIVIENNE DELON HERO on: 10/23/2023 12:56 PM   Modules accepted: Orders

## 2023-10-26 ENCOUNTER — Other Ambulatory Visit: Payer: Self-pay

## 2023-10-27 NOTE — Telephone Encounter (Signed)
 Pt was offered an appointment with Dr. Sheena for today. She was unable to come due to a dental appointment.

## 2023-10-29 MED ORDER — HYDROCHLOROTHIAZIDE 25 MG PO TABS: 25.0000 mg | ORAL_TABLET | Freq: Every day | ORAL | 0 refills | Status: DC

## 2023-11-20 ENCOUNTER — Other Ambulatory Visit: Payer: Self-pay | Admitting: Cardiology

## 2023-11-25 ENCOUNTER — Other Ambulatory Visit: Payer: Self-pay | Admitting: Cardiology

## 2023-11-27 MED ORDER — ATORVASTATIN CALCIUM 10 MG PO TABS: 10.0000 mg | ORAL_TABLET | Freq: Every day | ORAL | 0 refills | Status: DC

## 2023-12-02 ENCOUNTER — Telehealth: Payer: Self-pay | Admitting: Cardiology

## 2023-12-02 NOTE — Telephone Encounter (Signed)
 Per pharmacy fax rec'vd, pt states taking daily not every other day. Needs new Rx reflecting change. Fax scanned to media

## 2023-12-02 NOTE — Telephone Encounter (Signed)
 Faxed back decline.

## 2023-12-04 ENCOUNTER — Other Ambulatory Visit: Payer: Self-pay | Admitting: Cardiology

## 2023-12-04 ENCOUNTER — Encounter: Payer: Self-pay | Admitting: Family Medicine

## 2023-12-04 DIAGNOSIS — E119 Type 2 diabetes mellitus without complications: Secondary | ICD-10-CM

## 2023-12-04 DIAGNOSIS — K219 Gastro-esophageal reflux disease without esophagitis: Secondary | ICD-10-CM

## 2023-12-04 MED ORDER — FAMOTIDINE 20 MG PO TABS: ORAL_TABLET | ORAL | 2 refills | Status: AC

## 2023-12-04 MED ORDER — OMEPRAZOLE 40 MG PO CPDR
40.0000 mg | DELAYED_RELEASE_CAPSULE | Freq: Every day | ORAL | 5 refills | Status: AC
  Filled 2024-01-08: qty 30, 30d supply, fill #0

## 2023-12-04 NOTE — Telephone Encounter (Signed)
 Ok to send all refills to the pharmacy

## 2023-12-07 ENCOUNTER — Other Ambulatory Visit: Payer: Self-pay | Admitting: Cardiology

## 2023-12-09 ENCOUNTER — Telehealth (HOSPITAL_COMMUNITY): Payer: Self-pay | Admitting: Licensed Clinical Social Worker

## 2023-12-09 ENCOUNTER — Encounter: Payer: Self-pay | Admitting: Cardiology

## 2023-12-09 ENCOUNTER — Encounter: Payer: Self-pay | Admitting: "Endocrinology

## 2023-12-09 MED ORDER — ATORVASTATIN CALCIUM 10 MG PO TABS
10.0000 mg | ORAL_TABLET | Freq: Every day | ORAL | 3 refills | Status: AC
  Filled 2024-01-08 – 2024-01-11 (×2): qty 30, 30d supply, fill #0

## 2023-12-09 MED ORDER — VALSARTAN 80 MG PO TABS
80.0000 mg | ORAL_TABLET | Freq: Two times a day (BID) | ORAL | 1 refills | Status: AC
  Filled 2023-12-28 (×2): qty 60, 30d supply, fill #0
  Filled 2024-01-28: qty 60, 30d supply, fill #1

## 2023-12-09 MED ORDER — FUROSEMIDE 40 MG PO TABS
40.0000 mg | ORAL_TABLET | ORAL | 1 refills | Status: AC
  Filled 2024-01-08: qty 30, 60d supply, fill #0

## 2023-12-09 NOTE — Addendum Note (Signed)
 Addended by: Saafir Abdullah M on: 12/09/2023 09:55 AM   Modules accepted: Orders

## 2023-12-09 NOTE — Telephone Encounter (Signed)
 Lvm for pt to call back.

## 2023-12-09 NOTE — Telephone Encounter (Signed)
 CSW consulted to speak with pt regarding lack of insurance and assistance with [facility] bills and medication assistance.  Attempted to call pt- left VM requesting return call  Enez Monahan H. Shaquala Broeker, LCSW Clinical Social Worker [facility] Desk#: [phone] Cell#: [phone]

## 2023-12-14 ENCOUNTER — Other Ambulatory Visit: Payer: Self-pay

## 2023-12-14 MED ORDER — METFORMIN HCL ER 750 MG PO TB24
ORAL_TABLET | ORAL | 1 refills | Status: AC
  Filled 2023-12-28: qty 180, 90d supply, fill #0

## 2023-12-14 MED ORDER — GABAPENTIN 300 MG PO CAPS: 300.0000 mg | ORAL_CAPSULE | Freq: Three times a day (TID) | ORAL | 2 refills | Status: AC

## 2023-12-14 MED ORDER — VENLAFAXINE HCL ER 150 MG PO CP24
150.0000 mg | ORAL_CAPSULE | Freq: Every day | ORAL | 0 refills | Status: AC
  Filled 2023-12-14: qty 60, 60d supply, fill #0

## 2023-12-14 NOTE — Telephone Encounter (Signed)
 Called pt regarding having trouble getting her medication due to having no insurance. I informed pt of patient assistance program through [facility].

## 2023-12-15 ENCOUNTER — Other Ambulatory Visit: Payer: Self-pay

## 2023-12-15 ENCOUNTER — Encounter: Payer: Self-pay | Admitting: "Endocrinology

## 2023-12-15 DIAGNOSIS — E1140 Type 2 diabetes mellitus with diabetic neuropathy, unspecified: Secondary | ICD-10-CM

## 2023-12-15 MED ORDER — TRULICITY 4.5 MG/0.5ML ~~LOC~~ SOAJ
4.5000 mg | SUBCUTANEOUS | 0 refills | Status: AC
  Filled 2023-12-15: qty 2, 28d supply, fill #0
  Filled 2024-01-08: qty 2, 28d supply, fill #1

## 2023-12-21 ENCOUNTER — Telehealth (HOSPITAL_COMMUNITY): Payer: Self-pay | Admitting: Licensed Clinical Social Worker

## 2023-12-21 NOTE — Telephone Encounter (Signed)
 H&V Care Navigation CSW Progress Note  Clinical Social Worker consulted to speak with pt regarding lack of insurance.  Pt reports she was paying for insurance but premiums because unaffordable for her and she had to stop.  States she has 2 dependent children and makes less than $3000 so think she would be eligible for Medicaid if she is not over asset limits- sent email to [facility] DHHS workers to request they call pt to screen.  Pt currently getting some medications through [facility] at [facility]- unclear what all meds she'll be able to get thought program.  CSW sending pt application link to [employer] [facility] to help her get other medications in the mail for free.  CSW discussed [facility] Financial Assistance to help with outstanding and ongoing [facility] bills- sent application.  Will plan to check in with patient in next 1-2 weeks to get progress update   SDOH Screenings   Food Insecurity: Food Insecurity Present (05/08/2021)  Housing: Low Risk  (05/08/2021)  Transportation Needs: No Transportation Needs (05/08/2021)  Alcohol Screen: Low Risk  (05/08/2021)  Depression (PHQ2-9): High Risk (05/08/2021)  Financial Resource Strain: Medium Risk (12/21/2023)  Physical Activity: Sufficiently Active (05/08/2021)  Social Connections: Socially Isolated (05/08/2021)  Stress: Stress Concern Present (05/08/2021)  Tobacco Use: Medium Risk (08/17/2023)   Will continue to follow and assist as needed  Collins Dimaria H. Errick Salts, LCSW Clinical Social Worker [facility] Desk#: [phone] Cell#: [phone]

## 2023-12-21 NOTE — Telephone Encounter (Signed)
 Patient came in to office today and picked up the letter that was left in the front office for her.

## 2023-12-28 ENCOUNTER — Other Ambulatory Visit: Payer: Self-pay

## 2023-12-28 DIAGNOSIS — E119 Type 2 diabetes mellitus without complications: Secondary | ICD-10-CM

## 2024-01-08 ENCOUNTER — Other Ambulatory Visit: Payer: Self-pay

## 2024-01-11 ENCOUNTER — Other Ambulatory Visit: Payer: Self-pay

## 2024-01-19 ENCOUNTER — Other Ambulatory Visit: Payer: Self-pay

## 2024-01-25 ENCOUNTER — Ambulatory Visit: Payer: Self-pay

## 2024-01-25 VITALS — BP 148/90 | HR 95 | Temp 98.6°F | Resp 16 | Ht 66.0 in | Wt 257.6 lb

## 2024-01-25 DIAGNOSIS — E119 Type 2 diabetes mellitus without complications: Secondary | ICD-10-CM

## 2024-01-25 DIAGNOSIS — Z7689 Persons encountering health services in other specified circumstances: Secondary | ICD-10-CM

## 2024-01-25 DIAGNOSIS — Z139 Encounter for screening, unspecified: Secondary | ICD-10-CM

## 2024-01-25 LAB — POCT GLYCOSYLATED HEMOGLOBIN (HGB A1C): Hemoglobin A1C: 5.5 % (ref 4.0–5.6)

## 2024-01-25 LAB — POCT HEMOGLOBIN: Hemoglobin: 0 g/dL — AB (ref 11–14.6)

## 2024-01-25 NOTE — Progress Notes (Unsigned)
 "    Patient ID: Katie [name], female    DOB: 25-Apr-1985  MRN: [id number]  CC: Establish Care   Subjective: Katie [name] is a 39 y.o. female with past medical history of diabetes, obesity and hypertension who presents to clinic to establish care. Pt reports two month history of right arm numbness/tingling worse at night. Has history of torticollis.  Denies weakness.  Pt's main concern is difficulty paying for appointments and medications.  Reports difficulty paying for housing and utilities.     Patient Active Problem List   Diagnosis Date Noted   Right carpal tunnel syndrome 05/21/2023   Arm mass, left 05/21/2023   PCOS (polycystic ovarian syndrome) 07/15/2022   Gastroesophageal reflux disease without esophagitis 07/15/2022   Valium  overdose    Panic attack    Obesity    IBS (irritable bowel syndrome)    Hypertension    Hepatic steatosis    Fibromyalgia    Asthma    Arthritis    Anxiety    Acute blood loss anemia (ABLA) 05/31/2020   Status post primary low transverse cesarean section FTP 5/18 05/30/2020   Postpartum care following cesarean delivery 5/18 05/30/2020   Delivery by emergency cesarean 05/30/2020   Encounter for induction of labor 05/29/2020   Type 2 diabetes mellitus without complication, without long-term current use of insulin  (HCC) 04/02/2020   Morbid obesity (HCC) 04/02/2020   Depression    Borderline personality disorder (HCC) 05/26/2019   Chronic neck pain 04/04/2019   Complex atypical endometrial hyperplasia 02/24/2019   Torticollis 01/19/2014   Bronchospasm 01/19/2014   ADHD (attention deficit hyperactivity disorder) 03/01/2011     Medications Ordered Prior to Encounter[1]  Allergies[2]  Social History   Socioeconomic History   Marital status: Single    Spouse name: Not on file   Number of children: Not on file   Years of education: Not on file   Highest education level: Some college, no degree  Occupational History   Not  on file  Tobacco Use   Smoking status: Former    Current packs/day: 0.00    Average packs/day: 0.5 packs/day for 4.0 years (2.0 ttl pk-yrs)    Types: Cigarettes    Start date: 01/13/2001    Quit date: 01/13/2005    Years since quitting: 19.0   Smokeless tobacco: Never  Vaping Use   Vaping status: Never Used  Substance and Sexual Activity   Alcohol use: Not Currently   Drug use: No   Sexual activity: Not Currently  Other Topics Concern   Not on file  Social History Narrative   Not on file   Social Drivers of Health   Tobacco Use: Medium Risk (08/17/2023)   Patient History    Smoking Tobacco Use: Former    Smokeless Tobacco Use: Never    Passive Exposure: Not on file  Financial Resource Strain: Medium Risk (12/21/2023)   Overall Financial Resource Strain (CARDIA)    Difficulty of Paying Living Expenses: Somewhat hard  Food Insecurity: Food Insecurity Present (05/08/2021)   Hunger Vital Sign    Worried About Running Out of Food in the Last Year: Sometimes true    Ran Out of Food in the Last Year: Sometimes true  Transportation Needs: No Transportation Needs (05/08/2021)   PRAPARE - Administrator, Civil Service (Medical): No    Lack of Transportation (Non-Medical): No  Physical Activity: Sufficiently Active (05/08/2021)   Exercise Vital Sign    Days of Exercise per Week: 4  days    Minutes of Exercise per Session: 40 min  Stress: Stress Concern Present (05/08/2021)   [employer] of Occupational Health - Occupational Stress Questionnaire    Feeling of Stress : Very much  Social Connections: Socially Isolated (05/08/2021)   Social Connection and Isolation Panel    Frequency of Communication with Friends and Family: More than three times a week    Frequency of Social Gatherings with Friends and Family: Three times a week    Attends Religious Services: Never    Active Member of Clubs or Organizations: No    Attends Engineer, Structural: Not on file     Marital Status: Never married  Intimate Partner Violence: Not on file  Depression (PHQ2-9): High Risk (01/25/2024)   Depression (PHQ2-9)    PHQ-2 Score: 11  Alcohol Screen: Low Risk (05/08/2021)   Alcohol Screen    Last Alcohol Screening Score (AUDIT): 0  Housing: Low Risk (05/08/2021)   Housing    Last Housing Risk Score: 0  Utilities: Not on file  Health Literacy: Not on file    Family History  Problem Relation Age of Onset   Heart disease Mother    Depression Mother    High blood pressure Mother    Kidney disease Mother    Psychosis Mother    Hearing loss Father    Depression Father    Anxiety disorder Father    Asthma Daughter    Learning disabilities Daughter    Heart disease Maternal Grandfather    Other Brother        fatty liver   Multiple sclerosis Maternal Grandmother    COPD Paternal Grandmother    Heart failure Paternal Grandmother    Heart attack Paternal Grandmother    Stroke Paternal Grandmother    Lung cancer Paternal Grandfather     Past Surgical History:  Procedure Laterality Date   big toes ingrown toenail removed     CESAREAN SECTION N/A 05/30/2020   Procedure: CESAREAN SECTION;  Surgeon: Barbette Knock, MD;  Location: [facility]S;  Service: Obstetrics;  Laterality: N/A;   DILATATION & CURETTAGE/HYSTEROSCOPY WITH MYOSURE N/A 02/24/2019   Procedure: DILATATION & CURETTAGE/HYSTEROSCOPY WITH MYOSURE;  Surgeon: Barbette Knock, MD;  Location: [facility];  Service: Gynecology;  Laterality: N/A;   MOUTH SURGERY     baby teeth removed,adult teeth  pulled down   RADIOLOGY WITH ANESTHESIA N/A 06/07/2019   Procedure: MRI CERVICAL SPINE WITHOUT CONTRAST;  Surgeon: Radiologist, Medication, MD;  Location: [facility] OR;  Service: Radiology;  Laterality: N/A;   wisdom teeth      ROS: Review of Systems Negative except as stated above  PHYSICAL EXAM: BP (!) 148/90   Pulse 95   Temp 98.6 F (37 C) (Oral)   Resp 16   Ht 5' 6 (1.676 m)   Wt 257 lb 9.6  oz (116.8 kg)   SpO2 98%   BMI 41.58 kg/m   Physical Exam  General: well-appearing, no acute distress Skin: no jaundice, rashes, or lesions Cardiovascular: regular heart rate and rhythm, normal S1/S2, no murmurs, gallops, or rubs, peripheral pulses 2+ bilaterally Chest: no skeletal deformity, lungs clear to auscultation bilaterally, equal breath sounds bilaterally Musculoskeletal: normal gait Extremities: no peripheral edema   ASSESSMENT AND PLAN:  1. Encounter to establish care with new provider (Primary) - Patient has multiple health concerns but would like to wait to address them until she applies for financial assistance and meets with case management.  2.  Type 2 diabetes mellitus without complication, without long-term current use of insulin  (HCC) - HgB A1c is 5.5 which is at goal  3. Encounter for screening involving social determinants of health (SDoH) - AMB Referral VBCI Care Management    Patient was given the opportunity to ask questions.  Patient verbalized understanding of the plan and was able to repeat key elements of the plan.    Orders Placed This Encounter  Procedures   AMB Referral VBCI Care Management   POCT hemoglobin   HgB A1c     Requested Prescriptions    No prescriptions requested or ordered in this encounter    Return in about 3 months (around 04/24/2024) for physical, labs.  Sula Cower Laquitta Dominski, PA-C      [1]  Current Outpatient Medications on File Prior to Visit  Medication Sig Dispense Refill   acetaminophen  (TYLENOL ) 500 MG tablet Take 2 tablets (1,000 mg total) by mouth every 6 (six) hours. 30 tablet 0   albuterol  (VENTOLIN  HFA) 108 (90 Base) MCG/ACT inhaler Inhale 1-2 puffs into the lungs every 6 (six) hours as needed. 8 g 0   atorvastatin  (LIPITOR) 10 MG tablet Take 1 tablet (10 mg total) by mouth daily. 30 tablet 3   blood glucose meter kit and supplies KIT Use as directed twice a day 1 each 0   Blood Glucose Monitoring Suppl  (CONTOUR NEXT ONE) KIT Use as directed 1 kit 0   busPIRone  (BUSPAR ) 30 MG tablet Take 30 mg by mouth daily as needed.     butalbital -acetaminophen -caffeine  (FIORICET ) 50-325-40 MG tablet Take 1 tablet by mouth every 6 (six) hours as needed for headache. 14 tablet 0   cetirizine (ZYRTEC) 10 MG tablet Take 10 mg by mouth daily.     Dulaglutide  (TRULICITY ) 4.5 MG/0.5ML SOAJ Inject 4.5 mg as directed once a week. 6 mL 0   famotidine  (PEPCID ) 20 MG tablet TAKE 1 TABLET BY MOUTH EVERY MORNING AND TAKE 1 TABLET BY MOUTH EVERY NIGHT AT BEDTIME 60 tablet 2   furosemide  (LASIX ) 40 MG tablet Take 1 tablet (40 mg total) by mouth every other day. Please keep your appt on 01/29/24 at 9:40 am to continue to have medication refilled 30 tablet 1   gabapentin  (NEURONTIN ) 100 MG capsule TAKE 2 CAPSULES BY MOUTH AT BEDTIME 60 capsule 0   gabapentin  (NEURONTIN ) 300 MG capsule Take 1 capsule (300 mg total) by mouth 3 (three) times daily. 90 capsule 2   glucose blood (CONTOUR NEXT TEST) test strip Check 3 times a day 100 each 11   hydrochlorothiazide  (HYDRODIURIL ) 25 MG tablet Take 1 tablet (25 mg total) by mouth daily. 90 tablet 0   hydrOXYzine  (ATARAX ) 10 MG tablet Take 1 tablet (10 mg total) by mouth 3 (three) times daily as needed. 30 tablet 0   levonorgestrel (MIRENA) 20 MCG/DAY IUD 1 each by Intrauterine route once.     metFORMIN  (GLUCOPHAGE -XR) 750 MG 24 hr tablet Take 1 tablet (750 mg total) by mouth 2 (two) times daily before a meal. 180 tablet 1   metFORMIN  (GLUCOPHAGE -XR) 750 MG 24 hr tablet Take 1 tablet (750 mg total) by mouth in the morning AND 1 tablet (750 mg total) at bedtime. 180 tablet 1   mupirocin  ointment (BACTROBAN ) 2 % Apply 1 Application topically 2 (two) times daily. 22 g 0   naproxen  (NAPROSYN ) 500 MG tablet TAKE 1 TABLET BY MOUTH 2 TIMES A DAY AS NEEDED FOR MODERATE PAIN 60 tablet 5   omeprazole  (  PRILOSEC) 40 MG capsule Take 1 capsule (40 mg total) by mouth daily. 30 capsule 5   ondansetron   (ZOFRAN -ODT) 4 MG disintegrating tablet Take 1 tablet (4 mg total) by mouth every 8 (eight) hours as needed. 20 tablet 0   predniSONE  (DELTASONE ) 10 MG tablet Days 1-4 take 4 tablets (40 mg) daily  Days 5-8 take 3 tablets (30 mg) daily, Days 9-11 take 2 tablets (20 mg) daily, Days 12-14 take 1 tablet (10 mg) daily. 37 tablet 0   triamcinolone  cream (KENALOG ) 0.1 % Apply 1 Application topically 2 (two) times daily. 30 g 0   valsartan  (DIOVAN ) 80 MG tablet Take 1 tablet (80 mg total) by mouth 2 (two) times daily. Keep appt on 01/29/24 for further refills 60 tablet 1   venlafaxine  XR (EFFEXOR -XR) 150 MG 24 hr capsule Take 150 mg by mouth daily with breakfast.     venlafaxine  XR (EFFEXOR -XR) 150 MG 24 hr capsule Take 1 capsule (150 mg total) by mouth daily with food. 90 capsule 0   Vitamin D , Ergocalciferol , (DRISDOL ) 1.25 MG (50000 UNIT) CAPS capsule Take 1 capsule (50,000 Units total) by mouth every 7 (seven) days. 12 capsule 1   No current facility-administered medications on file prior to visit.  [2]  Allergies Allergen Reactions   Flexeril [Cyclobenzaprine Hcl] Shortness Of Breath   Lorazepam     Other reaction(s): Other Neck spasm   "

## 2024-01-27 ENCOUNTER — Encounter: Payer: Self-pay | Admitting: Cardiology

## 2024-01-28 ENCOUNTER — Other Ambulatory Visit: Payer: Self-pay | Admitting: Pharmacist

## 2024-01-28 ENCOUNTER — Other Ambulatory Visit: Payer: Self-pay

## 2024-01-28 DIAGNOSIS — Z79899 Other long term (current) drug therapy: Secondary | ICD-10-CM

## 2024-01-28 MED ORDER — HYDROCHLOROTHIAZIDE 25 MG PO TABS
25.0000 mg | ORAL_TABLET | Freq: Every day | ORAL | 2 refills | Status: DC
  Filled 2024-01-28: qty 90, 90d supply, fill #0

## 2024-01-28 NOTE — Progress Notes (Signed)
" ° °  01/28/2024 Name: Katie [name] MRN: [id number] DOB: April 21, 1985  Chief Complaint  Patient presents with   Medication Management   Lucianne Smestad is a 39 y.o. year old female who presented for a telephone visit.   They were referred to the pharmacist by the [facility] Case Management program for assistance in managing medication access.    Subjective:  Care Team: Primary Care Provider: Tapia Cedeno, Jorge, PA-C ; Next Scheduled Visit: 04/25/2024  Medication Access/Adherence  Current Pharmacy:  [facility] [address], [address] [place] [address] Phone: [phone] Fax: [phone]  Patient reports affordability concerns with their medications: Yes  - she recently lost her [place] Medicaid but now fills with out pharmacy. Rxns were transferred to our pharmacy last month. Of note, pharmacy reports that she is due for valsartan  and hydrochlorothiazide  refills.   Objective:  Lab Results  Component Value Date   HGBA1C 5.5 01/25/2024    Lab Results  Component Value Date   CREATININE 0.73 12/19/2022   BUN 12 12/19/2022   NA 142 12/19/2022   K 3.7 12/19/2022   CL 103 12/19/2022   CO2 30 12/19/2022    Lab Results  Component Value Date   CHOL 119 12/19/2022   HDL 33 (L) 12/19/2022   LDLCALC 61 12/19/2022   LDLDIRECT 89.0 10/03/2020   TRIG 186 (H) 12/19/2022   CHOLHDL 3.6 12/19/2022    Medications Reviewed Today   Medications were not reviewed in this encounter       Assessment/Plan:   Medication Management: - Currently strategy sufficient to maintain appropriate adherence to prescribed medication regimen. She has been filling with our pharmacy and now is able to get her medications.  - Collaborated with pharmacy. She is filling everything except for hydrochlorothiazide , which may explain why BP was elevated with PCP this month. I have reordered the hydrochlorothiazide  and sent to our  pharmacy.  -Collaborated with pharmacy and patient's valsartan  is due for a refill. They are refilling this.  Follow Up Plan: prn with pharmacy.  Herlene Fleeta Morris, PharmD, JAQUELINE, CPP Clinical Pharmacist [facility] [phone]    "

## 2024-01-29 ENCOUNTER — Other Ambulatory Visit: Payer: Self-pay | Admitting: Family Medicine

## 2024-01-29 ENCOUNTER — Ambulatory Visit: Admitting: Cardiology

## 2024-01-29 DIAGNOSIS — E559 Vitamin D deficiency, unspecified: Secondary | ICD-10-CM

## 2024-02-01 ENCOUNTER — Telehealth: Payer: Self-pay | Admitting: Licensed Clinical Social Worker

## 2024-02-01 NOTE — Telephone Encounter (Signed)
 H&V Care Navigation CSW Progress Note  Clinical Social Worker contacted patient by phone to f/u on cancelled appt and concerns shared regarding self pay status. She previously spoke w/ my colleague Jenna regarding Medicaid enrollment but no updates regarding that at this time. LCSW had sent MyChart offering resources. Called today and left additional voicemail at [phone]. Will re-attempt again as able one more time to offer any assistance we may be able to for self pay pts.  Patient is participating in a Managed Medicaid Plan:  No, self pay only  SDOH Screenings   Food Insecurity: Food Insecurity Present (05/08/2021)  Housing: Low Risk (05/08/2021)  Transportation Needs: No Transportation Needs (05/08/2021)  Alcohol Screen: Low Risk (05/08/2021)  Depression (PHQ2-9): High Risk (01/25/2024)  Financial Resource Strain: Medium Risk (12/21/2023)  Physical Activity: Sufficiently Active (05/08/2021)  Social Connections: Socially Isolated (05/08/2021)  Stress: Stress Concern Present (05/08/2021)  Tobacco Use: Medium Risk (08/17/2023)    Katie [name], MSW, LCSW Clinical Social Worker II [facility] Heart/Vascular Care Navigation  [phone]- work cell phone (preferred)

## 2024-02-02 ENCOUNTER — Telehealth: Payer: Self-pay | Admitting: *Deleted

## 2024-02-02 ENCOUNTER — Other Ambulatory Visit: Payer: Self-pay | Admitting: Cardiology

## 2024-02-02 NOTE — Progress Notes (Unsigned)
 Complex Care Management Note Care Guide Note  02/02/2024 Name: Lakea Mittelman MRN: [id number] DOB: 13-Aug-1985   Complex Care Management Outreach Attempts: An unsuccessful telephone outreach was attempted today to offer the patient information about available complex care management services.  Follow Up Plan:  Additional outreach attempts will be made to offer the patient complex care management information and services.   Encounter Outcome:  No Answer  Harlene Satterfield  [facility]  [facility] Guide  Direct Dial: [phone]  Fax [phone]

## 2024-02-03 ENCOUNTER — Telehealth: Payer: Self-pay | Admitting: Licensed Clinical Social Worker

## 2024-02-03 ENCOUNTER — Other Ambulatory Visit: Payer: Self-pay

## 2024-02-03 NOTE — Telephone Encounter (Signed)
 Dr. Sheena did prescribe this medication before. Please discuss this this matter with the provider.

## 2024-02-03 NOTE — Telephone Encounter (Signed)
 H&V Care Navigation CSW Progress Note  Clinical Social Worker contacted patient by phone to f/u on cancelled appt and concerns shared regarding self pay status. She previously spoke w/ my colleague Jenna regarding Medicaid enrollment but no updates regarding that at this time. LCSW had sent MyChart offering resources. Called today and left additional voicemail at [phone]. Remain available as needed should pt return calls or MyChart.   Patient is participating in a Managed Medicaid Plan:  No, self pay only  SDOH Screenings   Food Insecurity: Food Insecurity Present (05/08/2021)  Housing: Low Risk (05/08/2021)  Transportation Needs: No Transportation Needs (05/08/2021)  Alcohol Screen: Low Risk (05/08/2021)  Depression (PHQ2-9): High Risk (01/25/2024)  Financial Resource Strain: Medium Risk (12/21/2023)  Physical Activity: Sufficiently Active (05/08/2021)  Social Connections: Socially Isolated (05/08/2021)  Stress: Stress Concern Present (05/08/2021)  Tobacco Use: Medium Risk (08/17/2023)    Marit Lark, MSW, LCSW Clinical Social Worker II [facility] Heart/Vascular Care Navigation  [phone]- work cell phone (preferred)

## 2024-02-04 NOTE — Progress Notes (Signed)
 Complex Care Management Note Care Guide Note  02/04/2024 Name: Katie [name] MRN: [id number] DOB: August 30, 1985   Complex Care Management Outreach Attempts: A second unsuccessful outreach was attempted today to offer the patient with information about available complex care management services.  Follow Up Plan:  Additional outreach attempts will be made to offer the patient complex care management information and services.   Encounter Outcome:  No Answer  Harlene Satterfield  [facility]  [facility] Guide  Direct Dial: [phone]  Fax [phone]

## 2024-02-05 NOTE — Progress Notes (Signed)
 Complex Care Management Note Care Guide Note  02/05/2024 Name: Katie [name] MRN: [id number] DOB: 1985-03-06   Complex Care Management Outreach Attempts: A third unsuccessful outreach was attempted today to offer the patient with information about available complex care management services.  Follow Up Plan:  No further outreach attempts will be made at this time. We have been unable to contact the patient to offer or enroll patient in complex care management services.  Encounter Outcome:  No Answer  Harlene Satterfield  [facility]  [facility], [facility] Guide  Direct Dial: [phone]  Fax [phone]

## 2024-02-08 ENCOUNTER — Telehealth: Payer: Self-pay | Admitting: *Deleted

## 2024-02-08 NOTE — Progress Notes (Signed)
 Complex Care Management Note  Care Guide Note 02/08/2024 Name: Aberdeen Hafen MRN: [id number] DOB: 1985/07/02  Wynne Rozak is a 39 y.o. year old female who sees Leavy Lucas Fox, PA-C for primary care. I reached out to [employer] by phone today to offer complex care management services.  Ms. Summey was given information about Complex Care Management services today including:   The Complex Care Management services include support from the care team which includes your Nurse Care Manager, Clinical Social Worker, or Pharmacist.  The Complex Care Management team is here to help remove barriers to the health concerns and goals most important to you. Complex Care Management services are voluntary, and the patient may decline or stop services at any time by request to their care team member.   Complex Care Management Consent Status: Patient agreed to services and verbal consent obtained.   Follow up plan:  Telephone appointment with complex care management team member scheduled for:  2/9 with RNCM and 2/4 with BSW  Encounter Outcome:  Patient Scheduled  Harlene Satterfield  [facility]  [facility], [facility] Guide  Direct Dial: [phone]  Fax [phone]

## 2024-02-10 ENCOUNTER — Other Ambulatory Visit: Payer: Self-pay

## 2024-02-10 MED ORDER — HYDROCHLOROTHIAZIDE 25 MG PO TABS: 25.0000 mg | ORAL_TABLET | Freq: Every day | ORAL | 0 refills | Status: AC

## 2024-02-10 NOTE — Telephone Encounter (Signed)
 In accordance with refill protocols, please review and address the following requirements before this medication refill can be authorized:  Labs

## 2024-02-17 ENCOUNTER — Other Ambulatory Visit: Payer: Self-pay

## 2024-02-17 NOTE — Patient Instructions (Signed)
 Almarie Elon Bars - I am sorry I was unable to reach you today for our scheduled appointment. I work with Leavy Rode, [place], PA-C and am calling to support your healthcare needs. Please contact me at [phone] at your earliest convenience. I look forward to speaking with you soon.   Thank you,  Orlean Fey, BSW [place]  [facility] Social Worker, [employer] [phone]

## 2024-02-22 ENCOUNTER — Telehealth: Payer: Self-pay | Admitting: *Deleted

## 2024-03-01 ENCOUNTER — Telehealth: Payer: Self-pay

## 2024-04-20 ENCOUNTER — Ambulatory Visit: Payer: Self-pay | Admitting: Cardiology

## 2024-04-25 ENCOUNTER — Ambulatory Visit: Payer: Self-pay

## 2024-06-27 ENCOUNTER — Ambulatory Visit: Admitting: Podiatry
